# Patient Record
Sex: Female | Born: 1950 | Race: Black or African American | Hispanic: No | Marital: Single | Smoking: Former smoker
Health system: Southern US, Community
[De-identification: ages and names within clinical notes are randomized; demographics above are authoritative.]

## PROBLEM LIST (undated history)

## (undated) DIAGNOSIS — I639 Cerebral infarction, unspecified: Secondary | ICD-10-CM

## (undated) DIAGNOSIS — E7800 Pure hypercholesterolemia, unspecified: Secondary | ICD-10-CM

## (undated) DIAGNOSIS — K5792 Diverticulitis of intestine, part unspecified, without perforation or abscess without bleeding: Secondary | ICD-10-CM

## (undated) HISTORY — DX: Cerebral infarction, unspecified: I63.9

## (undated) HISTORY — PX: ABDOMINAL HYSTERECTOMY: SHX81

---

## 2010-03-22 ENCOUNTER — Ambulatory Visit: Payer: Self-pay | Admitting: Diagnostic Radiology

## 2010-03-22 ENCOUNTER — Emergency Department (HOSPITAL_BASED_OUTPATIENT_CLINIC_OR_DEPARTMENT_OTHER): Admission: EM | Admit: 2010-03-22 | Discharge: 2010-03-22 | Payer: Self-pay | Admitting: Emergency Medicine

## 2010-04-01 ENCOUNTER — Encounter: Admission: RE | Admit: 2010-04-01 | Discharge: 2010-04-17 | Payer: Self-pay | Admitting: Orthopaedic Surgery

## 2014-06-12 ENCOUNTER — Emergency Department (HOSPITAL_BASED_OUTPATIENT_CLINIC_OR_DEPARTMENT_OTHER)
Admission: EM | Admit: 2014-06-12 | Discharge: 2014-06-12 | Disposition: A | Discharge status: Home / Self Care | Payer: Worker's Compensation | Attending: Emergency Medicine | Admitting: Emergency Medicine

## 2014-06-12 ENCOUNTER — Emergency Department (HOSPITAL_BASED_OUTPATIENT_CLINIC_OR_DEPARTMENT_OTHER): Payer: Worker's Compensation

## 2014-06-12 ENCOUNTER — Encounter (HOSPITAL_BASED_OUTPATIENT_CLINIC_OR_DEPARTMENT_OTHER): Payer: Self-pay | Admitting: Emergency Medicine

## 2014-06-12 DIAGNOSIS — Y9289 Other specified places as the place of occurrence of the external cause: Secondary | ICD-10-CM | POA: Diagnosis not present

## 2014-06-12 DIAGNOSIS — S46909A Unspecified injury of unspecified muscle, fascia and tendon at shoulder and upper arm level, unspecified arm, initial encounter: Secondary | ICD-10-CM | POA: Insufficient documentation

## 2014-06-12 DIAGNOSIS — Y9389 Activity, other specified: Secondary | ICD-10-CM | POA: Insufficient documentation

## 2014-06-12 DIAGNOSIS — M25552 Pain in left hip: Secondary | ICD-10-CM

## 2014-06-12 DIAGNOSIS — S79919A Unspecified injury of unspecified hip, initial encounter: Secondary | ICD-10-CM | POA: Diagnosis not present

## 2014-06-12 DIAGNOSIS — Z862 Personal history of diseases of the blood and blood-forming organs and certain disorders involving the immune mechanism: Secondary | ICD-10-CM | POA: Diagnosis not present

## 2014-06-12 DIAGNOSIS — W010XXA Fall on same level from slipping, tripping and stumbling without subsequent striking against object, initial encounter: Secondary | ICD-10-CM | POA: Insufficient documentation

## 2014-06-12 DIAGNOSIS — S4980XA Other specified injuries of shoulder and upper arm, unspecified arm, initial encounter: Secondary | ICD-10-CM | POA: Insufficient documentation

## 2014-06-12 DIAGNOSIS — Z8639 Personal history of other endocrine, nutritional and metabolic disease: Secondary | ICD-10-CM | POA: Insufficient documentation

## 2014-06-12 DIAGNOSIS — Z7982 Long term (current) use of aspirin: Secondary | ICD-10-CM | POA: Insufficient documentation

## 2014-06-12 DIAGNOSIS — S79929A Unspecified injury of unspecified thigh, initial encounter: Secondary | ICD-10-CM

## 2014-06-12 DIAGNOSIS — M25512 Pain in left shoulder: Secondary | ICD-10-CM

## 2014-06-12 DIAGNOSIS — W19XXXA Unspecified fall, initial encounter: Secondary | ICD-10-CM

## 2014-06-12 HISTORY — DX: Pure hypercholesterolemia, unspecified: E78.00

## 2014-06-12 MED ORDER — KETOROLAC TROMETHAMINE 60 MG/2ML IM SOLN
60.0000 mg | Freq: Once | INTRAMUSCULAR | Status: AC
  Administered 2014-06-12: 60 mg via INTRAMUSCULAR
  Filled 2014-06-12: qty 2

## 2014-06-12 NOTE — Discharge Instructions (Signed)
Fall Prevention and Home Safety °Falls cause injuries and can affect all age groups. It is possible to use preventive measures to significantly decrease the likelihood of falls. There are many simple measures which can make your home safer and prevent falls. °OUTDOORS °· Repair cracks and edges of walkways and driveways. °· Remove high doorway thresholds. °· Trim shrubbery on the main path into your home. °· Have good outside lighting. °· Clear walkways of tools, rocks, debris, and clutter. °· Check that handrails are not broken and are securely fastened. Both sides of steps should have handrails. °· Have leaves, snow, and ice cleared regularly. °· Use sand or salt on walkways during winter months. °· In the garage, clean up grease or oil spills. °BATHROOM °· Install night lights. °· Install grab bars by the toilet and in the tub and shower. °· Use non-skid mats or decals in the tub or shower. °· Place a plastic non-slip stool in the shower to sit on, if needed. °· Keep floors dry and clean up all water on the floor immediately. °· Remove soap buildup in the tub or shower on a regular basis. °· Secure bath mats with non-slip, double-sided rug tape. °· Remove throw rugs and tripping hazards from the floors. °BEDROOMS °· Install night lights. °· Make sure a bedside light is easy to reach. °· Do not use oversized bedding. °· Keep a telephone by your bedside. °· Have a firm chair with side arms to use for getting dressed. °· Remove throw rugs and tripping hazards from the floor. °KITCHEN °· Keep handles on pots and pans turned toward the center of the stove. Use back burners when possible. °· Clean up spills quickly and allow time for drying. °· Avoid walking on wet floors. °· Avoid hot utensils and knives. °· Position shelves so they are not too high or low. °· Place commonly used objects within easy reach. °· If necessary, use a sturdy step stool with a grab bar when reaching. °· Keep electrical cables out of the  way. °· Do not use floor polish or wax that makes floors slippery. If you must use wax, use non-skid floor wax. °· Remove throw rugs and tripping hazards from the floor. °STAIRWAYS °· Never leave objects on stairs. °· Place handrails on both sides of stairways and use them. Fix any loose handrails. Make sure handrails on both sides of the stairways are as long as the stairs. °· Check carpeting to make sure it is firmly attached along stairs. Make repairs to worn or loose carpet promptly. °· Avoid placing throw rugs at the top or bottom of stairways, or properly secure the rug with carpet tape to prevent slippage. Get rid of throw rugs, if possible. °· Have an electrician put in a light switch at the top and bottom of the stairs. °OTHER FALL PREVENTION TIPS °· Wear low-heel or rubber-soled shoes that are supportive and fit well. Wear closed toe shoes. °· When using a stepladder, make sure it is fully opened and both spreaders are firmly locked. Do not climb a closed stepladder. °· Add color or contrast paint or tape to grab bars and handrails in your home. Place contrasting color strips on first and last steps. °· Learn and use mobility aids as needed. Install an electrical emergency response system. °· Turn on lights to avoid dark areas. Replace light bulbs that burn out immediately. Get light switches that glow. °· Arrange furniture to create clear pathways. Keep furniture in the same place. °·   Firmly attach carpet with non-skid or double-sided tape. °· Eliminate uneven floor surfaces. °· Select a carpet pattern that does not visually hide the edge of steps. °· Be aware of all pets. °OTHER HOME SAFETY TIPS °· Set the water temperature for 120° F (48.8° C). °· Keep emergency numbers on or near the telephone. °· Keep smoke detectors on every level of the home and near sleeping areas. °Document Released: 08/27/2002 Document Revised: 03/07/2012 Document Reviewed: 11/26/2011 °ExitCare® Patient Information ©2015  [employer]. This information is not intended to replace advice given to you by your health care provider. Make sure you discuss any questions you have with your health care provider. °Musculoskeletal Pain °Musculoskeletal pain is muscle and boney aches and pains. These pains can occur in any part of the body. Your caregiver may treat you without knowing the cause of the pain. They may treat you if blood or urine tests, X-rays, and other tests were normal.  °CAUSES °There is often not a definite cause or reason for these pains. These pains may be caused by a type of germ (virus). The discomfort may also come from overuse. Overuse includes working out too hard when your body is not fit. Boney aches also come from weather changes. Bone is sensitive to atmospheric pressure changes. °HOME CARE INSTRUCTIONS  °· Ask when your test results will be ready. Make sure you get your test results. °· Only take over-the-counter or prescription medicines for pain, discomfort, or fever as directed by your caregiver. If you were given medications for your condition, do not drive, operate machinery or power tools, or sign legal documents for 24 hours. Do not drink alcohol. Do not take sleeping pills or other medications that may interfere with treatment. °· Continue all activities unless the activities cause more pain. When the pain lessens, slowly resume normal activities. Gradually increase the intensity and duration of the activities or exercise. °· During periods of severe pain, bed rest may be helpful. Lay or sit in any position that is comfortable. °· Putting ice on the injured area. °¨ Put ice in a bag. °¨ Place a towel between your skin and the bag. °¨ Leave the ice on for 15 to 20 minutes, 3 to 4 times a day. °· Follow up with your caregiver for continued problems and no reason can be found for the pain. If the pain becomes worse or does not go away, it may be necessary to repeat tests or do additional testing. Your  caregiver may need to look further for a possible cause. °SEEK IMMEDIATE MEDICAL CARE IF: °· You have pain that is getting worse and is not relieved by medications. °· You develop chest pain that is associated with shortness or breath, sweating, feeling sick to your stomach (nauseous), or throw up (vomit). °· Your pain becomes localized to the abdomen. °· You develop any new symptoms that seem different or that concern you. °MAKE SURE YOU:  °· Understand these instructions. °· Will watch your condition. °· Will get help right away if you are not doing well or get worse. °Document Released: 09/06/2005 Document Revised: 11/29/2011 Document Reviewed: 05/11/2013 °ExitCare® Patient Information ©2015 [employer]. This information is not intended to replace advice given to you by your health care provider. Make sure you discuss any questions you have with your health care provider. ° °

## 2014-06-12 NOTE — ED Provider Notes (Signed)
CSN: [id number]     Arrival date & time 06/12/14  0940 History   First MD Initiated Contact with Patient 06/12/14 1148     Chief Complaint  Patient presents with  . Fall     (Consider location/radiation/quality/duration/timing/severity/associated sxs/prior Treatment) Patient is a 63 y.o. female presenting with fall.  Fall This is a new problem. The current episode started yesterday. Episode frequency: once. Associated symptoms comments: Left shoulder pain, left hip pain . The symptoms are aggravated by walking (movement). Nothing relieves the symptoms.    Past Medical History  Diagnosis Date  . High cholesterol    History reviewed. No pertinent past surgical history. No family history on file. History  Substance Use Topics  . Smoking status: Never Smoker   . Smokeless tobacco: Not on file  . Alcohol Use: Not on file   OB History   Grav Para Term Preterm Abortions TAB SAB Ect Mult Living                 Review of Systems  All other systems reviewed and are negative.     Allergies  Review of patient's allergies indicates no known allergies.  Home Medications   Prior to Admission medications   Medication Sig Start Date End Date Taking? Authorizing Provider  aspirin 81 MG tablet Take 81 mg by mouth daily.   Yes Historical Provider, MD   BP 143/80  Pulse 70  Temp(Src) 98.1 F (36.7 C) (Oral)  Resp 18  Ht 5' 5.5" (1.664 m)  Wt 154 lb (69.854 kg)  BMI 25.23 kg/m2  SpO2 99% Physical Exam  Nursing note and vitals reviewed. Constitutional: She is oriented to person, place, and time. She appears well-developed and well-nourished. No distress.  HENT:  Head: Normocephalic and atraumatic. Head is without raccoon's eyes and without Battle's sign.  Nose: Nose normal.  Eyes: Conjunctivae and EOM are normal. Pupils are equal, round, and reactive to light. No scleral icterus.  Neck: No spinous process tenderness and no muscular tenderness present.  Cardiovascular: Normal  rate, regular rhythm, normal heart sounds and intact distal pulses.   No murmur heard. Pulmonary/Chest: Effort normal and breath sounds normal. She has no rales. She exhibits no tenderness.  Abdominal: Soft. There is no tenderness. There is no rebound and no guarding.  Musculoskeletal: Normal range of motion. She exhibits no edema.       Left shoulder: She exhibits tenderness. She exhibits normal range of motion, no deformity, normal pulse and normal strength.       Left hip: She exhibits tenderness. She exhibits normal range of motion, normal strength and no deformity.       Thoracic back: She exhibits no tenderness and no bony tenderness.       Lumbar back: She exhibits no tenderness and no bony tenderness.  No evidence of trauma to extremities, except as noted.  2+ distal pulses.    Neurological: She is alert and oriented to person, place, and time.  Skin: Skin is warm and dry. No rash noted.  Psychiatric: She has a normal mood and affect.    ED Course  Procedures (including critical care time) Labs Review Labs Reviewed - No data to display  Imaging Review Dg Hip Complete Left  06/12/2014   CLINICAL DATA:  Pain post fall yesterday, left hip pain  EXAM: LEFT HIP - COMPLETE 2+ VIEW  COMPARISON:  None.  FINDINGS: Three views of the left hip submitted. No acute fracture or subluxation. Bilateral hip joints  are symmetrical in appearance. Mild bilateral spurring of greater femoral trochanter. Mild degenerative changes pubic symphysis.  IMPRESSION: No acute fracture or subluxation. Mild degenerative changes as described above.   Electronically Signed   By: Natasha Mead M.D.   On: 06/12/2014 10:23   Dg Shoulder Left  06/12/2014   CLINICAL DATA:  Fall yesterday, left shoulder pain, left hip pain  EXAM: LEFT SHOULDER - 2+ VIEW  COMPARISON:  None.  FINDINGS: There is no evidence of fracture or dislocation. Glenohumeral joint is preserved. Mild degenerative changes AC joint. There is mild spurring of  humeral head.  IMPRESSION: No acute fracture or subluxation. Mild degenerative changes as described above.   Electronically Signed   By: Natasha Mead M.D.   On: 06/12/2014 10:22  All radiology studies independently viewed by me.      EKG Interpretation None      MDM   Final diagnoses:  Fall, initial encounter  Left shoulder pain  Left hip pain    63 yo female presenting with soreness in left shoulder and left hip after a fall yesterday.  She did not strike her head, lose consciousness, or hurt her neck during the fall.  Soreness is severe and was worse when she woke up this morning.  Plain films negative.  Exam unremarkable.  Ambulates without limp.  Don't suspect occult hip fracture.  Plan dc, NSAIDs.    Candyce Churn III, MD 06/12/14 [id number]

## 2014-06-12 NOTE — ED Notes (Signed)
Patient reports that she slipped at work yesterday falling onto left side landing on concrete floor. No loc, complains of left shoulder and left hip pain. Ambulatory on arrival

## 2014-06-12 NOTE — ED Notes (Signed)
Patient preparing for discharge. 

## 2019-11-02 ENCOUNTER — Ambulatory Visit: Payer: Medicare Other | Attending: Internal Medicine

## 2019-11-02 DIAGNOSIS — Z23 Encounter for immunization: Secondary | ICD-10-CM

## 2019-11-02 NOTE — Progress Notes (Signed)
   Covid-19 Vaccination Clinic  Name:  Aviva Wolfer    MRN: [id number] DOB: 06-04-1951  11/02/2019  Ms. Corso was observed post Covid-19 immunization for 15 minutes without incidence. She was provided with Vaccine Information Sheet and instruction to access the V-Safe system.   Ms. Andis was instructed to call 911 with any severe reactions post vaccine: Marland Kitchen Difficulty breathing  . Swelling of your face and throat  . A fast heartbeat  . A bad rash all over your body  . Dizziness and weakness    Immunizations Administered    Name Date Dose VIS Date Route   Pfizer COVID-19 Vaccine 11/02/2019  2:52 PM 0.3 mL 08/31/2019 Intramuscular   Manufacturer: [employer], [employer]   Lot: [id number]   NDC: [address]-1

## 2019-11-25 ENCOUNTER — Ambulatory Visit: Payer: Medicare Other | Attending: Internal Medicine

## 2019-11-25 DIAGNOSIS — Z23 Encounter for immunization: Secondary | ICD-10-CM

## 2019-11-25 NOTE — Progress Notes (Signed)
   Covid-19 Vaccination Clinic  Name:  Anhelica Fowers    MRN: [id number] DOB: [birth year]  11/25/2019  Ms. Ow was observed post Covid-19 immunization for 15 minutes without incident. She was provided with Vaccine Information Sheet and instruction to access the V-Safe system.   Ms. Eilers was instructed to call 911 with any severe reactions post vaccine: Marland Kitchen Difficulty breathing  . Swelling of face and throat  . A fast heartbeat  . A bad rash all over body  . Dizziness and weakness   Immunizations Administered    Name Date Dose VIS Date Route   Pfizer COVID-19 Vaccine 11/25/2019  9:21 AM 0.3 mL 08/31/2019 Intramuscular   Manufacturer: [employer], [employer]   Lot: [id number]   NDC: [address]-9

## 2020-03-25 ENCOUNTER — Encounter (HOSPITAL_BASED_OUTPATIENT_CLINIC_OR_DEPARTMENT_OTHER): Payer: Self-pay

## 2020-03-25 ENCOUNTER — Inpatient Hospital Stay (HOSPITAL_BASED_OUTPATIENT_CLINIC_OR_DEPARTMENT_OTHER)
Admission: EM | Admit: 2020-03-25 | Discharge: 2020-03-28 | DRG: 392 | Disposition: A | Discharge status: Home / Self Care | Payer: Medicare Other | Attending: Internal Medicine | Admitting: Internal Medicine

## 2020-03-25 ENCOUNTER — Other Ambulatory Visit: Payer: Self-pay

## 2020-03-25 ENCOUNTER — Emergency Department (HOSPITAL_BASED_OUTPATIENT_CLINIC_OR_DEPARTMENT_OTHER): Payer: Medicare Other

## 2020-03-25 DIAGNOSIS — Z91018 Allergy to other foods: Secondary | ICD-10-CM | POA: Diagnosis not present

## 2020-03-25 DIAGNOSIS — Z20822 Contact with and (suspected) exposure to covid-19: Secondary | ICD-10-CM | POA: Diagnosis present

## 2020-03-25 DIAGNOSIS — I10 Essential (primary) hypertension: Secondary | ICD-10-CM | POA: Diagnosis present

## 2020-03-25 DIAGNOSIS — Z79899 Other long term (current) drug therapy: Secondary | ICD-10-CM

## 2020-03-25 DIAGNOSIS — Z90710 Acquired absence of both cervix and uterus: Secondary | ICD-10-CM

## 2020-03-25 DIAGNOSIS — R030 Elevated blood-pressure reading, without diagnosis of hypertension: Secondary | ICD-10-CM | POA: Diagnosis not present

## 2020-03-25 DIAGNOSIS — Z7982 Long term (current) use of aspirin: Secondary | ICD-10-CM

## 2020-03-25 DIAGNOSIS — Z87891 Personal history of nicotine dependence: Secondary | ICD-10-CM | POA: Diagnosis not present

## 2020-03-25 DIAGNOSIS — E785 Hyperlipidemia, unspecified: Secondary | ICD-10-CM

## 2020-03-25 DIAGNOSIS — R1032 Left lower quadrant pain: Secondary | ICD-10-CM | POA: Diagnosis not present

## 2020-03-25 DIAGNOSIS — Z881 Allergy status to other antibiotic agents status: Secondary | ICD-10-CM | POA: Diagnosis not present

## 2020-03-25 DIAGNOSIS — K5720 Diverticulitis of large intestine with perforation and abscess without bleeding: Principal | ICD-10-CM | POA: Diagnosis present

## 2020-03-25 DIAGNOSIS — R451 Restlessness and agitation: Secondary | ICD-10-CM | POA: Diagnosis not present

## 2020-03-25 DIAGNOSIS — E7800 Pure hypercholesterolemia, unspecified: Secondary | ICD-10-CM

## 2020-03-25 DIAGNOSIS — K5792 Diverticulitis of intestine, part unspecified, without perforation or abscess without bleeding: Secondary | ICD-10-CM

## 2020-03-25 LAB — URINALYSIS, MICROSCOPIC (REFLEX)

## 2020-03-25 LAB — CBC
HCT: 46.4 % — ABNORMAL HIGH (ref 36.0–46.0)
Hemoglobin: 15.2 g/dL — ABNORMAL HIGH (ref 12.0–15.0)
MCH: 33.3 pg (ref 26.0–34.0)
MCHC: 32.8 g/dL (ref 30.0–36.0)
MCV: 101.8 fL — ABNORMAL HIGH (ref 80.0–100.0)
Platelets: 212 10*3/uL (ref 150–400)
RBC: 4.56 MIL/uL (ref 3.87–5.11)
RDW: 13.3 % (ref 11.5–15.5)
WBC: 8.4 10*3/uL (ref 4.0–10.5)
nRBC: 0 % (ref 0.0–0.2)

## 2020-03-25 LAB — COMPREHENSIVE METABOLIC PANEL
ALT: 13 U/L (ref 0–44)
AST: 17 U/L (ref 15–41)
Albumin: 4.2 g/dL (ref 3.5–5.0)
Alkaline Phosphatase: 80 U/L (ref 38–126)
Anion gap: 12 (ref 5–15)
BUN: 10 mg/dL (ref 8–23)
CO2: 19 mmol/L — ABNORMAL LOW (ref 22–32)
Calcium: 9.4 mg/dL (ref 8.9–10.3)
Chloride: 103 mmol/L (ref 98–111)
Creatinine, Ser: 0.86 mg/dL (ref 0.44–1.00)
GFR calc Af Amer: >60 mL/min (ref >60)
GFR calc non Af Amer: >60 mL/min (ref >60)
Glucose, Bld: 91 mg/dL (ref 70–99)
Potassium: 4.1 mmol/L (ref 3.5–5.1)
Sodium: 134 mmol/L — ABNORMAL LOW (ref 135–145)
Total Bilirubin: 0.8 mg/dL (ref 0.3–1.2)
Total Protein: 7.1 g/dL (ref 6.5–8.1)

## 2020-03-25 LAB — LIPASE, BLOOD: Lipase: 18 U/L (ref 11–51)

## 2020-03-25 LAB — URINALYSIS, ROUTINE W REFLEX MICROSCOPIC
Glucose, UA: 250 mg/dL — AB
Hgb urine dipstick: NEGATIVE
Ketones, ur: NEGATIVE mg/dL
Leukocytes,Ua: NEGATIVE
Nitrite: POSITIVE — AB
Protein, ur: 30 mg/dL — AB
Specific Gravity, Urine: <1.005 — ABNORMAL LOW (ref 1.005–1.030)
pH: 5 (ref 5.0–8.0)

## 2020-03-25 LAB — SARS CORONAVIRUS 2 BY RT PCR (HOSPITAL ORDER, PERFORMED IN ~~LOC~~ HOSPITAL LAB): SARS Coronavirus 2: NEGATIVE

## 2020-03-25 MED ORDER — SODIUM CHLORIDE 0.9 % IV SOLN
2.0000 g | Freq: Once | INTRAVENOUS | Status: AC
  Administered 2020-03-25: 2 g via INTRAVENOUS
  Filled 2020-03-25: qty 20

## 2020-03-25 MED ORDER — ONDANSETRON HCL 4 MG/2ML IJ SOLN
4.0000 mg | Freq: Once | INTRAMUSCULAR | Status: AC
  Administered 2020-03-25: 4 mg via INTRAVENOUS
  Filled 2020-03-25: qty 2

## 2020-03-25 MED ORDER — KETOROLAC TROMETHAMINE 30 MG/ML IJ SOLN
15.0000 mg | Freq: Once | INTRAMUSCULAR | Status: AC
  Administered 2020-03-25: 15 mg via INTRAVENOUS
  Filled 2020-03-25: qty 1

## 2020-03-25 MED ORDER — LACTATED RINGERS IV SOLN: INTRAVENOUS | Status: DC

## 2020-03-25 MED ORDER — ONDANSETRON HCL 4 MG PO TABS: 4.0000 mg | ORAL_TABLET | Freq: Four times a day (QID) | ORAL | Status: DC | PRN

## 2020-03-25 MED ORDER — ENOXAPARIN SODIUM 40 MG/0.4ML ~~LOC~~ SOLN
40.0000 mg | SUBCUTANEOUS | Status: DC
  Administered 2020-03-25 – 2020-03-27 (×3): 40 mg via SUBCUTANEOUS
  Filled 2020-03-25 (×3): qty 0.4

## 2020-03-25 MED ORDER — ONDANSETRON HCL 4 MG/2ML IJ SOLN: 4.0000 mg | Freq: Four times a day (QID) | INTRAMUSCULAR | Status: DC | PRN

## 2020-03-25 MED ORDER — METRONIDAZOLE IN NACL 5-0.79 MG/ML-% IV SOLN
500.0000 mg | Freq: Three times a day (TID) | INTRAVENOUS | Status: DC
  Administered 2020-03-25 – 2020-03-28 (×8): 500 mg via INTRAVENOUS
  Filled 2020-03-25 (×8): qty 100

## 2020-03-25 MED ORDER — SODIUM CHLORIDE 0.9 % IV SOLN
2.0000 g | INTRAVENOUS | Status: DC
  Administered 2020-03-26 – 2020-03-27 (×2): 2 g via INTRAVENOUS
  Filled 2020-03-25: qty 2
  Filled 2020-03-25: qty 20
  Filled 2020-03-25: qty 2

## 2020-03-25 MED ORDER — IOHEXOL 300 MG/ML  SOLN
100.0000 mL | Freq: Once | INTRAMUSCULAR | Status: AC | PRN
  Administered 2020-03-25: 100 mL via INTRAVENOUS

## 2020-03-25 MED ORDER — DIPHENHYDRAMINE HCL 50 MG/ML IJ SOLN
25.0000 mg | Freq: Three times a day (TID) | INTRAMUSCULAR | Status: AC | PRN
  Administered 2020-03-26 (×2): 25 mg via INTRAVENOUS
  Filled 2020-03-25 (×2): qty 1

## 2020-03-25 MED ORDER — METRONIDAZOLE IN NACL 5-0.79 MG/ML-% IV SOLN
500.0000 mg | Freq: Once | INTRAVENOUS | Status: AC
  Administered 2020-03-25: 500 mg via INTRAVENOUS
  Filled 2020-03-25: qty 100

## 2020-03-25 MED ORDER — METOPROLOL TARTRATE 5 MG/5ML IV SOLN: 5.0000 mg | INTRAVENOUS | Status: DC | PRN

## 2020-03-25 MED ORDER — SODIUM CHLORIDE 0.9% FLUSH
3.0000 mL | Freq: Once | INTRAVENOUS | Status: DC
  Filled 2020-03-25: qty 3

## 2020-03-25 MED ORDER — METOPROLOL TARTRATE 5 MG/5ML IV SOLN
5.0000 mg | INTRAVENOUS | Status: DC | PRN
  Administered 2020-03-25: 5 mg via INTRAVENOUS
  Filled 2020-03-25: qty 5

## 2020-03-25 MED ORDER — FENTANYL CITRATE (PF) 100 MCG/2ML IJ SOLN
50.0000 ug | Freq: Once | INTRAMUSCULAR | Status: AC
  Administered 2020-03-25: 50 ug via INTRAVENOUS
  Filled 2020-03-25: qty 2

## 2020-03-25 MED ORDER — SODIUM CHLORIDE 0.9 % IV BOLUS
1000.0000 mL | Freq: Once | INTRAVENOUS | Status: AC
  Administered 2020-03-25: 1000 mL via INTRAVENOUS

## 2020-03-25 MED ORDER — LISINOPRIL 10 MG PO TABS
10.0000 mg | ORAL_TABLET | Freq: Every day | ORAL | Status: DC
  Administered 2020-03-26 – 2020-03-27 (×2): 10 mg via ORAL
  Filled 2020-03-25 (×3): qty 1

## 2020-03-25 MED ORDER — SODIUM CHLORIDE 0.9 % IV SOLN: INTRAVENOUS | Status: DC | PRN

## 2020-03-25 MED ORDER — HYDROMORPHONE HCL 1 MG/ML IJ SOLN
0.5000 mg | INTRAMUSCULAR | Status: DC | PRN
  Administered 2020-03-25: 0.5 mg via INTRAVENOUS
  Administered 2020-03-25 – 2020-03-26 (×4): 1 mg via INTRAVENOUS
  Filled 2020-03-25 (×5): qty 1

## 2020-03-25 MED ORDER — HYDRALAZINE HCL 25 MG PO TABS
25.0000 mg | ORAL_TABLET | Freq: Three times a day (TID) | ORAL | Status: DC
  Administered 2020-03-25 – 2020-03-28 (×10): 25 mg via ORAL
  Filled 2020-03-25 (×10): qty 1

## 2020-03-25 NOTE — ED Notes (Signed)
Nurse informed of recollect

## 2020-03-25 NOTE — ED Provider Notes (Signed)
Medical screening examination/treatment/procedure(s) were conducted as a shared visit with non-physician practitioner(s) and myself.  I personally evaluated the patient during the encounter.     Patient seen by me along with physician assistant.  Patient with complaint of left-sided abdominal pain for 3 days.  No nausea vomiting or diarrhea.  Is worse after voiding.  Some tenderness left lower quadrant.  CT scan shows diverticulitis with a intraluminal abscess measuring 1.7 cm.  Discussed with on-call general surgery.  The recommending inpatient admission for IV antibiotics.  This will be is with the hospitalist service and general surgery can consult.  Patient otherwise nontoxic no acute distress no leukocytosis.  Not tachycardic.  Not febrile.   Vanetta Mulders, MD 03/25/20 1622

## 2020-03-25 NOTE — Consult Note (Signed)
Reason for Consult:sigmoid diverticulitis with abscess Referring Physician: Dr. [name] is an 69 y.o. female.  HPI: This is a 69 year old female transferred from med [employer].  She presented with a 3 to 4-day history of left lower quadrant abdominal pain.  It is been intermittent and occasionally sharp and as severe as 9 out of 10.  Currently she is feeling better.  Her bowel movements have been normal.  She denies fevers or chills.  She has no prior history of diverticulitis.  She reports her last colonoscopy was at least 7 years ago and she has had benign polyps removed in the past.  There is no family history of colon cancer.  She underwent a CAT scan of the abdomen and pelvis showing diverticulitis in the sigmoid colon with an intramural abscess measuring about 1.7 cm.  Past Medical History:  Diagnosis Date  . High cholesterol     Past Surgical History:  Procedure Laterality Date  . ABDOMINAL HYSTERECTOMY      History reviewed. No pertinent family history.  Social History:  reports that she has quit smoking. She has never used smokeless tobacco. She reports current alcohol use. No history on file for drug use.  Allergies:  Allergies  Allergen Reactions  . Doxycycline Hives  . Strawberry (Diagnostic) Hives    Medications: I have reviewed the patient's current medications.  Results for orders placed or performed during the hospital encounter of 03/25/20 (from the past 48 hour(s))  Urinalysis, Routine w reflex microscopic     Status: Abnormal   Collection Time: 03/25/20  1:32 PM  Result Value Ref Range   Color, Urine ORANGE (A) YELLOW    Comment: BIOCHEMICALS MAY BE AFFECTED BY COLOR   APPearance CLEAR CLEAR   Specific Gravity, Urine <1.005 (L) 1.005 - 1.030   pH 5.0 5.0 - 8.0   Glucose, UA 250 (A) NEGATIVE mg/dL   Hgb urine dipstick NEGATIVE NEGATIVE   Bilirubin Urine SMALL (A) NEGATIVE   Ketones, ur NEGATIVE NEGATIVE mg/dL   Protein, ur 30 (A)  NEGATIVE mg/dL   Nitrite POSITIVE (A) NEGATIVE   Leukocytes,Ua NEGATIVE NEGATIVE    Comment: Performed at [facility], [address] [place], [place] [address]  Urinalysis, Microscopic (reflex)     Status: Abnormal   Collection Time: 03/25/20  1:32 PM  Result Value Ref Range   RBC / HPF 0-5 0 - 5 RBC/hpf   WBC, UA 0-5 0 - 5 WBC/hpf   Bacteria, UA RARE (A) NONE SEEN   Squamous Epithelial / LPF 0-5 0 - 5    Comment: Performed at [facility], [address] [place], [place] [address]  CBC     Status: Abnormal   Collection Time: 03/25/20  1:49 PM  Result Value Ref Range   WBC 8.4 4.0 - 10.5 K/uL   RBC 4.56 3.87 - 5.11 MIL/uL   Hemoglobin 15.2 (H) 12.0 - 15.0 g/dL   HCT 93.2 (H) 36 - 46 %   MCV 101.8 (H) 80.0 - 100.0 fL   MCH 33.3 26.0 - 34.0 pg   MCHC 32.8 30.0 - 36.0 g/dL   RDW 35.5 73.2 - 20.2 %   Platelets 212 150 - 400 K/uL   nRBC 0.0 0.0 - 0.2 %    Comment: Performed at [facility], [address] [place], [place] [address]  Comprehensive metabolic panel     Status: Abnormal   Collection Time:  03/25/20  2:20 PM  Result Value Ref Range   Sodium 134 (L) 135 - 145 mmol/L   Potassium 4.1 3.5 - 5.1 mmol/L    Comment: SLIGHT HEMOLYSIS   Chloride 103 98 - 111 mmol/L   CO2 19 (L) 22 - 32 mmol/L   Glucose, Bld 91 70 - 99 mg/dL    Comment: Glucose reference range applies only to samples taken after fasting for at least 8 hours.   BUN 10 8 - 23 mg/dL   Creatinine, Ser 6.60 0.44 - 1.00 mg/dL   Calcium 9.4 8.9 - 63.0 mg/dL   Total Protein 7.1 6.5 - 8.1 g/dL   Albumin 4.2 3.5 - 5.0 g/dL   AST 17 15 - 41 U/L   ALT 13 0 - 44 U/L   Alkaline Phosphatase 80 38 - 126 U/L   Total Bilirubin 0.8 0.3 - 1.2 mg/dL   GFR calc non Af Amer >60 >60 mL/min   GFR calc Af Amer >60 >60 mL/min   Anion gap 12 5 - 15    Comment: Performed at [facility], [address] [place], [place] [address]  Lipase, blood     Status: None   Collection Time:  03/25/20  2:20 PM  Result Value Ref Range   Lipase 18 11 - 51 U/L    Comment: Performed at [facility], [address] [place], [place] [address]  SARS Coronavirus 2 by RT PCR (hospital order, performed in [facility] hospital lab) Nasopharyngeal Nasopharyngeal Swab     Status: None   Collection Time: 03/25/20  4:42 PM   Specimen: Nasopharyngeal Swab  Result Value Ref Range   SARS Coronavirus 2 NEGATIVE NEGATIVE    Comment: (NOTE) SARS-CoV-2 target nucleic acids are NOT DETECTED.  The SARS-CoV-2 RNA is generally detectable in upper and lower respiratory specimens during the acute phase of infection. The lowest concentration of SARS-CoV-2 viral copies this assay can detect is 250 copies / mL. A negative result does not preclude SARS-CoV-2 infection and should not be used as the sole basis for treatment or other patient management decisions.  A negative result may occur with improper specimen collection / handling, submission of specimen other than nasopharyngeal swab, presence of viral mutation(s) within the areas targeted by this assay, and inadequate number of viral copies (<250 copies / mL). A negative result must be combined with clinical observations, patient history, and epidemiological information.  Fact Sheet for Patients:   BoilerBrush.com.cy  Fact Sheet for Healthcare Providers: https://pope.com/  This test is not yet approved or  cleared by the [place] FDA and has been authorized for detection and/or diagnosis of SARS-CoV-2 by FDA under an Emergency Use Authorization (EUA).  This EUA will remain in effect (meaning this test can be used) for the duration of the COVID-19 declaration under Section 564(b)(1) of the Act, 21 U.S.C. section 360bbb-3(b)(1), unless the authorization is terminated or revoked sooner.  Performed at [facility], [address] [place], [place] [address]     CT  ABDOMEN PELVIS W CONTRAST  Result Date: 03/25/2020 CLINICAL DATA:  Abdominal pain.  LEFT-sided abdominal pain EXAM: CT ABDOMEN AND PELVIS WITH CONTRAST TECHNIQUE: Multidetector CT imaging of the abdomen and pelvis was performed using the standard protocol following bolus administration of intravenous contrast. CONTRAST:  OMNIPAQUE IOHEXOL 300 MG/ML  SOLN COMPARISON:  None. FINDINGS: Lower chest: Lung bases are clear. Hepatobiliary: Multiple small hypodense lesions in liver likely  represent small cysts. Gallbladder normal. Pancreas: Pancreas is normal. No ductal dilatation. No pancreatic inflammation. Spleen: Normal spleen Adrenals/urinary tract: Adrenal glands and kidneys are normal. The ureters and bladder normal. Stomach/Bowel: Stomach, duodenum small-bowel. Cecum and appendix are normal. Ascending transverse colon normal. Descending colon normal. There is inflammatory process along the anti mesenteric wall of the proximal sigmoid colon. This inflammation occurs over approximately 5 cm segment (image 65/2). There appears to be small abscess within the wall of the sigmoid colon with a rim enhancing low-density collection measuring 1.7 cm image 65/2. This is through a region of extensive diverticulosis. NO frank perforation or extraluminal abscess. Vascular/Lymphatic: Abdominal aorta is normal caliber with atherosclerotic calcification. There is no retroperitoneal or periportal lymphadenopathy. No pelvic lymphadenopathy. Reproductive: Post hysterectomy no adnexal abnormality Other: No free fluid. Musculoskeletal: No aggressive osseous lesion. IMPRESSION: 1. Acute diverticulitis of the proximal sigmoid with intramural abscess. NO frank perforation or extraluminal abscess identified. 2. As there is asymmetric thickening through the sigmoid colon recommend follow-up imaging to exclude an unlikely underlying neoplasm. Electronically Signed   By: Morgan Sparks M.D.   On: 03/25/2020 15:46    Review of Systems   Constitutional: Negative for chills and fever.  Respiratory: Negative for shortness of breath.   Cardiovascular: Negative for chest pain.  Gastrointestinal: Positive for abdominal pain. Negative for blood in stool, constipation, diarrhea and nausea.  Genitourinary: Negative for difficulty urinating.  All other systems reviewed and are negative.  Blood pressure (!) 175/90, pulse 70, temperature 98.6 F (37 C), temperature source Oral, resp. rate 20, height 5\' 5"  (1.651 m), weight 66.9 kg, SpO2 100 %. Physical Exam Constitutional:      General: She is not in acute distress.    Appearance: She is well-developed. She is not diaphoretic.  HENT:     Head: Normocephalic and atraumatic.  Cardiovascular:     Rate and Rhythm: Normal rate and regular rhythm.     Heart sounds: Normal heart sounds.  Pulmonary:     Effort: Pulmonary effort is normal.     Breath sounds: Normal breath sounds.  Chest:     Chest wall: No tenderness.  Abdominal:     General: There is no distension.     Palpations: Abdomen is soft.     Tenderness: There is abdominal tenderness in the left lower quadrant.     Hernia: No hernia is present.  Neurological:     General: No focal deficit present.     Mental Status: She is alert.  Psychiatric:        Mood and Affect: Mood normal.        Behavior: Behavior normal.     Assessment/Plan: Sigmoid diverticulitis with intramural abscess  Patient is being admitted for IV antibiotics and bowel rest.  I explained the diagnosis to her and a family member by phone.  Hopefully she will improve quickly without the need for any surgical intervention which would necessitate a possible resection and colostomy.  Currently, the abscess is not amenable to IR drainage.  Hopefully she will improve and then it is recommended that she have a colonoscopy in approximately 6 to 8 weeks to rule out a malignancy.  Again, bowel rest is currently recommended especially given the abscess and the  amount of discomfort she has been having.  Surgery will follow with you.  03/25/2020, 7:45 PM

## 2020-03-25 NOTE — Progress Notes (Signed)
Pt arrived to floor via [facility] from [facility]. Admitter and On Call with General Surgery made aware of her arrival. Pt A&O x4, BP elevated, all other VSS. Minimal complaints of pain in LLQ at this time. Will continue to monitor patient.

## 2020-03-25 NOTE — ED Notes (Signed)
Patient transported to CT 

## 2020-03-25 NOTE — ED Triage Notes (Signed)
Pt c/o left side abd pain x 3 days-denies n/v/d-states pain worse after voiding-denies dysuria-states she did use OTC AZO without relief-NAD-slow gait

## 2020-03-25 NOTE — H&P (Signed)
History and Physical    Morgan Sparks UVO:[id number] DOB: 1950/11/27 DOA: 03/25/2020  PCP: Karle Plumber, MD  Patient coming from: Home  I have personally briefly reviewed patient's old medical records in [facility]  Chief Complaint: Abdominal pain  HPI: Morgan Sparks is a 69 y.o. female with medical history significant of hypercholesterolemia, not on meds, occasional elevated blood pressure though she is on no meds for this either.  She presented to the ER today with a several week history of lower abdominal pain that she initially attributed to a UTI and tried Azo for but did not improve.  Over the last 3 to 4 days she has had increasing pain mostly is in the left lower quadrant.  She denies any change in her bowels, nausea, vomiting,  ED Course: ED found a 1.7 cm intraluminal diverticular abscess.  Surgery was consulted, they advised admission to the hospitalist service for IV antibiotics.  We are asked to admit to manage her diverticular abscess.  Review of Systems: As per HPI otherwise 10 point review of systems negative.   Past Medical History:  Diagnosis Date  . High cholesterol     Past Surgical History:  Procedure Laterality Date  . ABDOMINAL HYSTERECTOMY       reports that she has quit smoking. She has never used smokeless tobacco. She reports current alcohol use. No history on file for drug use.  Allergies  Allergen Reactions  . Doxycycline Hives  . Strawberry (Diagnostic) Hives    History reviewed. No pertinent family history.  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 Authorizing Provider  albuterol (VENTOLIN HFA) 108 (90 Base) MCG/ACT inhaler Inhale 2 puffs into the lungs daily as needed for wheezing or shortness of breath.  12/18/19  Yes [provider]  aspirin 81 MG tablet  Take 81 mg by mouth See admin instructions. Takes twice a week   Yes [provider]  ezetimibe-simvastatin (VYTORIN) 10-20 MG tablet Take 1 tablet by mouth daily.   Yes [provider]  sodium chloride (OCEAN) 0.65 % SOLN nasal spray Place 1 spray into both nostrils as needed for congestion.   Yes [provider]    Physical Exam: Vitals:   03/25/20 1444 03/25/20 1516 03/25/20 1703 03/25/20 1832  BP: (!) 168/91 (!) 189/102 (!) 148/96 (!) 175/90  Pulse: 78 79 70 70  Resp: 18 16 16 20   Temp:    98.6 F (37 C)  TempSrc:    Oral  SpO2: 98% 99% 98% 100%  Weight:    66.9 kg  Height:    5\' 5"  (1.651 m)    Constitutional: NAD, calm, comfortable Eyes: PERRL, lids and conjunctivae normal ENMT: Mucous membranes are moist. Posterior pharynx clear of any exudate or lesions.Normal dentition.  Neck: normal, supple, no masses, no thyromegaly Respiratory: clear to auscultation bilaterally, no wheezing, no crackles. Normal respiratory effort. No accessory muscle use.  Cardiovascular: Regular rate and rhythm, no murmurs / rubs / gallops. No extremity edema. 2+ pedal pulses. No carotid bruits.  Abdomen: Left lower quadrant tenderness, no masses palpated. No hepatosplenomegaly. Bowel sounds sluggish.  Musculoskeletal: no clubbing / cyanosis. No joint deformity upper and lower extremities. Good ROM, no contractures. Normal muscle tone.  Skin: no rashes, lesions, ulcers. No induration Neurologic: CN 2-12 grossly intact. Sensation intact,  DTR normal. Strength 5/5 in all 4.  Psychiatric: Normal judgment and insight. Alert and oriented x 3. Normal mood.   Labs on Admission: I have personally reviewed following labs and imaging studies  CBC: Recent Labs  Lab 03/25/20 1349  WBC 8.4  HGB 15.2*  HCT 46.4*  MCV 101.8*  PLT 212   Basic Metabolic Panel: Recent Labs  Lab 03/25/20 1420  NA 134*  K 4.1  CL 103  CO2 19*  GLUCOSE 91  BUN 10  CREATININE 0.86  CALCIUM 9.4    GFR: Estimated Creatinine Clearance: 57.1 mL/min (by C-G formula based on SCr of 0.86 mg/dL). Liver Function Tests: Recent Labs  Lab 03/25/20 1420  AST 17  ALT 13  ALKPHOS 80  BILITOT 0.8  PROT 7.1  ALBUMIN 4.2   Recent Labs  Lab 03/25/20 1420  LIPASE 18   Urine analysis:    Component Value Date/Time   COLORURINE ORANGE (A) 03/25/2020 1332   APPEARANCEUR CLEAR 03/25/2020 1332   LABSPEC <1.005 (L) 03/25/2020 1332   PHURINE 5.0 03/25/2020 1332   GLUCOSEU 250 (A) 03/25/2020 1332   HGBUR NEGATIVE 03/25/2020 1332   BILIRUBINUR SMALL (A) 03/25/2020 1332   KETONESUR NEGATIVE 03/25/2020 1332   PROTEINUR 30 (A) 03/25/2020 1332   NITRITE POSITIVE (A) 03/25/2020 1332   LEUKOCYTESUR NEGATIVE 03/25/2020 1332    Radiological Exams on Admission: CT ABDOMEN PELVIS W CONTRAST  Result Date: 03/25/2020 CLINICAL DATA:  Abdominal pain.  LEFT-sided abdominal pain EXAM: CT ABDOMEN AND PELVIS WITH CONTRAST TECHNIQUE: Multidetector CT imaging of the abdomen and pelvis was performed using the standard protocol following bolus administration of intravenous contrast. CONTRAST:  OMNIPAQUE IOHEXOL 300 MG/ML  SOLN COMPARISON:  None. FINDINGS: Lower chest: Lung bases are clear. Hepatobiliary: Multiple small hypodense lesions in liver likely represent small cysts. Gallbladder normal. Pancreas: Pancreas is normal. No ductal dilatation. No pancreatic inflammation. Spleen: Normal spleen Adrenals/urinary tract: Adrenal glands and kidneys are normal. The ureters and bladder normal. Stomach/Bowel: Stomach, duodenum small-bowel. Cecum and appendix are normal. Ascending transverse colon normal. Descending colon normal. There is inflammatory process along the anti mesenteric wall of the proximal sigmoid colon. This inflammation occurs over approximately 5 cm segment (image 65/2). There appears to be small abscess within the wall of the sigmoid colon with a rim enhancing low-density collection measuring 1.7 cm  image 65/2. This is through a region of extensive diverticulosis. NO frank perforation or extraluminal abscess. Vascular/Lymphatic: Abdominal aorta is normal caliber with atherosclerotic calcification. There is no retroperitoneal or periportal lymphadenopathy. No pelvic lymphadenopathy. Reproductive: Post hysterectomy no adnexal abnormality Other: No free fluid. Musculoskeletal: No aggressive osseous lesion. IMPRESSION: 1. Acute diverticulitis of the proximal sigmoid with intramural abscess. NO frank perforation or extraluminal abscess identified. 2. As there is asymmetric thickening through the sigmoid colon recommend follow-up imaging to exclude an unlikely underlying neoplasm. Electronically Signed   By: Genevive Bi M.D.   On: 03/25/2020 15:46    Assessment/Plan Principal Problem:   Colonic diverticular abscess Active Problems:   Diverticulitis   Hypertension, essential   Hypercholesteremia  Colonic diverticular abscess On Rocephin plus Flagyl Surgery consult performed Bowel rest with clears Pain control  Hypertension  Lisinopril started Hydralazine Metoprolol as needed  Hypercholesterolemia  Low-cholesterol diet  DVT prophylaxis: Lovenox SQ Code Status: Full code  Family Communication: Patient at bedside Disposition Plan: Home Consults called: Surgery Admission status: Inpatient for IV antibiotics, plus pain control   Reva Bores MD [facility]  If 7PM-7AM,  please contact night-coverage 03/25/2020, 8:13 PM

## 2020-03-25 NOTE — Care Plan (Signed)
[name] (DOB 11/06/1950) by ED PA Harolyn Rutherford  The patient is a overall well-appearing 69 year old came in with abdominal pain on the left side and she has a diverticulitis by CT scan with an inflammatory abscess small 1.7 cm but she is nontoxic-appearing does not have sepsis and no leukocytosis. EDP talked to Dr. Donell Beers with general surgery and she recommended medicine admission and IV antibiotics. Patient Ok'd for clear liquids and ice chips by Surgery. She has a mild Metabolic Acidosis. Please notify Surgery when she gets admitted and transferred over.  Pressure is also elevated and she does not have a history of hypertension.  She has a history of abdominal hysterectomy as well as hyperlipidemia.  Accepted to a telemetry bed Covid testing still pending.

## 2020-03-25 NOTE — ED Provider Notes (Signed)
[facility] EMERGENCY DEPARTMENT Provider Note   CSN: [id number] Arrival date & time: 03/25/20  1225     History Chief Complaint  Patient presents with  . Abdominal Pain    Morgan Sparks is a 69 y.o. female.  HPI     Morgan Sparks is a 69 y.o. female, with a history of hypercholesterolemia, hysterectomy, presenting to the ED with abdominal pain for the last 3 to 4 days.  Pain is in the left lower abdomen, intermittent, currently 9/10, radiating at times toward the groin. She has not had this pain before. Last bowel movement was today and was normal. Denies fever/chills, N/V/C/D, chest pain, shortness of breath, back pain, urinary symptoms, dizziness, syncope, neurologic deficits, falls/trauma, hematochezia/melena, or any other complaints.     Past Medical History:  Diagnosis Date  . High cholesterol     Patient Active Problem List   Diagnosis Date Noted  . Diverticulitis 03/25/2020    Past Surgical History:  Procedure Laterality Date  . ABDOMINAL HYSTERECTOMY       OB History   No obstetric history on file.     History reviewed. No pertinent family history.  Social History   Tobacco Use  . Smoking status: Former Games developer  . Smokeless tobacco: Never Used  Vaping Use  . Vaping Use: Never used  Substance Use Topics  . Alcohol use: Yes    Comment: occ  . Drug use: Not on file    Home Medications Prior to Admission medications   Medication Sig Start Date End Date Taking? Authorizing Provider  aspirin 81 MG tablet Take 81 mg by mouth daily.    [provider]    Allergies    Doxycycline and Strawberry (diagnostic)  Review of Systems   Review of Systems  Constitutional: Negative for chills, diaphoresis and fever.  Respiratory: Negative for cough and shortness of breath.   Cardiovascular: Negative for chest pain.  Gastrointestinal: Positive for abdominal pain. Negative for blood in stool, constipation, diarrhea, nausea and vomiting.   Genitourinary: Negative for difficulty urinating, dysuria, flank pain and hematuria.  Musculoskeletal: Negative for back pain.  Neurological: Negative for dizziness, syncope, weakness and numbness.  All other systems reviewed and are negative.   Physical Exam Updated Vital Signs BP (!) 178/107 (BP Location: Right Arm)   Pulse 81   Temp 98 F (36.7 C) (Oral)   Resp 18   Ht 5\' 5"  (1.651 m)   Wt 66.2 kg   SpO2 100%   BMI 24.30 kg/m   Physical Exam Vitals and nursing note reviewed.  Constitutional:      General: She is not in acute distress.    Appearance: She is well-developed. She is not diaphoretic.  HENT:     Head: Normocephalic and atraumatic.     Mouth/Throat:     Mouth: Mucous membranes are moist.     Pharynx: Oropharynx is clear.  Eyes:     Conjunctiva/sclera: Conjunctivae normal.  Cardiovascular:     Rate and Rhythm: Normal rate and regular rhythm.     Pulses: Normal pulses.          Radial pulses are 2+ on the right side and 2+ on the left side.       Posterior tibial pulses are 2+ on the right side and 2+ on the left side.     Heart sounds: Normal heart sounds.     Comments: Tactile temperature in the extremities appropriate and equal bilaterally. Pulmonary:  Effort: Pulmonary effort is normal. No respiratory distress.     Breath sounds: Normal breath sounds.  Abdominal:     Palpations: Abdomen is soft.     Tenderness: There is abdominal tenderness. There is no right CVA tenderness, left CVA tenderness or guarding.    Musculoskeletal:     Cervical back: Neck supple.     Right lower leg: No edema.     Left lower leg: No edema.  Lymphadenopathy:     Cervical: No cervical adenopathy.  Skin:    General: Skin is warm and dry.  Neurological:     Mental Status: She is alert.     Comments: Sensation grossly intact to light touch in the lower extremities bilaterally. No saddle anesthesias. Strength 5/5 in the bilateral lower extremities. No noted gait  deficit. Coordination intact.  Psychiatric:        Mood and Affect: Mood and affect normal.        Speech: Speech normal.        Behavior: Behavior normal.     ED Results / Procedures / Treatments   Labs (all labs ordered are listed, but only abnormal results are displayed) Labs Reviewed  CBC - Abnormal; Notable for the following components:      Result Value   Hemoglobin 15.2 (*)    HCT 46.4 (*)    MCV 101.8 (*)    All other components within normal limits  URINALYSIS, ROUTINE W REFLEX MICROSCOPIC - Abnormal; Notable for the following components:   Color, Urine ORANGE (*)    Specific Gravity, Urine <1.005 (*)    Glucose, UA 250 (*)    Bilirubin Urine SMALL (*)    Protein, ur 30 (*)    Nitrite POSITIVE (*)    All other components within normal limits  URINALYSIS, MICROSCOPIC (REFLEX) - Abnormal; Notable for the following components:   Bacteria, UA RARE (*)    All other components within normal limits  COMPREHENSIVE METABOLIC PANEL - Abnormal; Notable for the following components:   Sodium 134 (*)    CO2 19 (*)    All other components within normal limits  SARS CORONAVIRUS 2 BY RT PCR (HOSPITAL ORDER, PERFORMED IN [facility])  LIPASE, BLOOD    EKG None  Radiology CT ABDOMEN PELVIS W CONTRAST  Result Date: 03/25/2020 CLINICAL DATA:  Abdominal pain.  LEFT-sided abdominal pain EXAM: CT ABDOMEN AND PELVIS WITH CONTRAST TECHNIQUE: Multidetector CT imaging of the abdomen and pelvis was performed using the standard protocol following bolus administration of intravenous contrast. CONTRAST:  OMNIPAQUE IOHEXOL 300 MG/ML  SOLN COMPARISON:  None. FINDINGS: Lower chest: Lung bases are clear. Hepatobiliary: Multiple small hypodense lesions in liver likely represent small cysts. Gallbladder normal. Pancreas: Pancreas is normal. No ductal dilatation. No pancreatic inflammation. Spleen: Normal spleen Adrenals/urinary tract: Adrenal glands and kidneys are normal. The  ureters and bladder normal. Stomach/Bowel: Stomach, duodenum small-bowel. Cecum and appendix are normal. Ascending transverse colon normal. Descending colon normal. There is inflammatory process along the anti mesenteric wall of the proximal sigmoid colon. This inflammation occurs over approximately 5 cm segment (image 65/2). There appears to be small abscess within the wall of the sigmoid colon with a rim enhancing low-density collection measuring 1.7 cm image 65/2. This is through a region of extensive diverticulosis. NO frank perforation or extraluminal abscess. Vascular/Lymphatic: Abdominal aorta is normal caliber with atherosclerotic calcification. There is no retroperitoneal or periportal lymphadenopathy. No pelvic lymphadenopathy. Reproductive: Post hysterectomy no adnexal abnormality Other:  No free fluid. Musculoskeletal: No aggressive osseous lesion. IMPRESSION: 1. Acute diverticulitis of the proximal sigmoid with intramural abscess. NO frank perforation or extraluminal abscess identified. 2. As there is asymmetric thickening through the sigmoid colon recommend follow-up imaging to exclude an unlikely underlying neoplasm. Electronically Signed   By: Genevive BiStewart  Edmunds M.D.   On: 03/25/2020 15:46    Procedures Procedures (including critical care time)  Medications Ordered in ED Medications  sodium chloride flush (NS) 0.9 % injection 3 mL (3 mLs Intravenous Not Given 03/25/20 1327)  cefTRIAXone (ROCEPHIN) 2 g in sodium chloride 0.9 % 100 mL IVPB (0 g Intravenous Stopped 03/25/20 1702)    And  metroNIDAZOLE (FLAGYL) IVPB 500 mg (500 mg Intravenous New Bag/Given 03/25/20 1706)  0.9 %  sodium chloride infusion ( Intravenous Stopped 03/25/20 1703)  hydrALAZINE (APRESOLINE) tablet 25 mg (25 mg Oral Given 03/25/20 1701)  ketorolac (TORADOL) 30 MG/ML injection 15 mg (15 mg Intravenous Given 03/25/20 1404)  ondansetron (ZOFRAN) injection 4 mg (4 mg Intravenous Given 03/25/20 1404)  iohexol (OMNIPAQUE) 300 MG/ML  solution 100 mL (100 mLs Intravenous Contrast Given 03/25/20 1502)  fentaNYL (SUBLIMAZE) injection 50 mcg (50 mcg Intravenous Given 03/25/20 1638)  sodium chloride 0.9 % bolus 1,000 mL (1,000 mLs Intravenous New Bag/Given 03/25/20 1706)    ED Course  I have reviewed the triage vital signs and the nursing notes.  Pertinent labs & imaging results that were available during my care of the patient were reviewed by me and considered in my medical decision making (see chart for details).  Clinical Course as of Mar 25 1713  Tue Mar 25, 2020  1500 Has been taking AZO.  Color, Urine(!): ORANGE [SJ]  1519 Patient states she feels much better.  Rates her pain 5/10.   [SJ]  [id number] Spoke with Dr. Donell BeersByerly, general surgery.  Recommends medicine admission. Agrees with choice of antibiotics, ceftriaxone and flagyl. Oral pain meds ok. No oral ABX. She may have sips of clear liquids and ice chips.   [SJ]  1652 Spoke with Dr. Marland McalpineSheikh, hospitalist. Agrees to admit the patient. [facility] He requests we order medication for her blood pressure. Suggests hydralazine. IV hydralazine is on backorder. He states oral hydralazine is acceptable.   [SJ]    Clinical Course User Index [SJ] Zoye Chandra, Hillard DankerShawn C, PA-C   MDM Rules/Calculators/A&P                          Patient presents with abdominal pain for the last three days.  Patient is nontoxic appearing, afebrile, not tachycardic, not tachypneic, not hypotensive, maintains excellent SPO2 on room air.   I reviewed and interpreted the patient's labs and radiological studies. No leukocytosis. CT with evidence of diverticulitis with intramural abscess. We will admit the patient for IV antibiotics.   Patient's hypertension is noted.  Patient denies any history of hypertension, however, she also states she "does not like going to the doctor."  My suspicion for hypertensive emergency is low as patient is asymptomatic.  Findings and  plan of care discussed with Vanetta MuldersScott Zackowski, MD. Dr. Deretha EmoryZackowski personally evaluated and examined this patient.    Patient's description of pain and its pattern gave suspicion for possible renal colic.  Additionally, patient was opposed to IV narcotic analgesics, specifically morphine, because she is concerned about the side effects and possible complications.  For these reasons, Toradol was chosen as initial analgesic.  Vitals:  03/25/20 1240 03/25/20 1444 03/25/20 1516 03/25/20 1703  BP: (!) 178/107 (!) 168/91 (!) 189/102 (!) 148/96  Pulse:  78 79 70  Resp:  18 16 16   Temp:      TempSrc:      SpO2:  98% 99%   Weight:      Height:         Final Clinical Impression(s) / ED Diagnoses Final diagnoses:  Diverticulitis    Rx / DC Orders ED Discharge Orders    None       , PA-C 03/27/20 05/28/20    0277, MD 03/29/20 1512

## 2020-03-25 NOTE — ED Notes (Signed)
Pt c/o L low back pain radiating into the LLQ. States she has pressure when she urinates. She started taking AZO without relief. Denies vaginal discharge.

## 2020-03-25 NOTE — Plan of Care (Signed)

## 2020-03-26 DIAGNOSIS — K5720 Diverticulitis of large intestine with perforation and abscess without bleeding: Principal | ICD-10-CM

## 2020-03-26 LAB — CBC
HCT: 39.6 % (ref 36.0–46.0)
Hemoglobin: 12.7 g/dL (ref 12.0–15.0)
MCH: 33.2 pg (ref 26.0–34.0)
MCHC: 32.1 g/dL (ref 30.0–36.0)
MCV: 103.4 fL — ABNORMAL HIGH (ref 80.0–100.0)
Platelets: 171 10*3/uL (ref 150–400)
RBC: 3.83 MIL/uL — ABNORMAL LOW (ref 3.87–5.11)
RDW: 13.4 % (ref 11.5–15.5)
WBC: 6 10*3/uL (ref 4.0–10.5)
nRBC: 0 % (ref 0.0–0.2)

## 2020-03-26 LAB — BASIC METABOLIC PANEL
Anion gap: 9 (ref 5–15)
BUN: 10 mg/dL (ref 8–23)
CO2: 21 mmol/L — ABNORMAL LOW (ref 22–32)
Calcium: 9 mg/dL (ref 8.9–10.3)
Chloride: 107 mmol/L (ref 98–111)
Creatinine, Ser: 0.79 mg/dL (ref 0.44–1.00)
GFR calc Af Amer: >60 mL/min (ref >60)
GFR calc non Af Amer: >60 mL/min (ref >60)
Glucose, Bld: 87 mg/dL (ref 70–99)
Potassium: 3.8 mmol/L (ref 3.5–5.1)
Sodium: 137 mmol/L (ref 135–145)

## 2020-03-26 LAB — HIV ANTIBODY (ROUTINE TESTING W REFLEX): HIV Screen 4th Generation wRfx: NONREACTIVE

## 2020-03-26 MED ORDER — LABETALOL HCL 5 MG/ML IV SOLN
10.0000 mg | INTRAVENOUS | Status: DC | PRN
  Administered 2020-03-27: 5 mg via INTRAVENOUS
  Administered 2020-03-27: 10 mg via INTRAVENOUS
  Filled 2020-03-26 (×2): qty 4

## 2020-03-26 MED ORDER — HYDROMORPHONE HCL 1 MG/ML IJ SOLN
0.5000 mg | INTRAMUSCULAR | Status: DC | PRN
  Administered 2020-03-26 – 2020-03-28 (×7): 0.5 mg via INTRAVENOUS
  Filled 2020-03-26 (×7): qty 0.5

## 2020-03-26 MED ORDER — HYDROXYZINE HCL 25 MG PO TABS
25.0000 mg | ORAL_TABLET | ORAL | Status: DC | PRN
  Administered 2020-03-26 – 2020-03-27 (×3): 25 mg via ORAL
  Filled 2020-03-26 (×3): qty 1

## 2020-03-26 MED ORDER — ACETAMINOPHEN 325 MG PO TABS: 650.0000 mg | ORAL_TABLET | Freq: Four times a day (QID) | ORAL | Status: DC | PRN

## 2020-03-26 MED ORDER — OXYCODONE HCL 5 MG PO TABS
5.0000 mg | ORAL_TABLET | ORAL | Status: DC | PRN
  Administered 2020-03-26: 5 mg via ORAL
  Filled 2020-03-26: qty 1

## 2020-03-26 MED ORDER — OXYCODONE HCL 5 MG PO TABS: 5.0000 mg | ORAL_TABLET | ORAL | Status: DC | PRN

## 2020-03-26 MED ORDER — ACETAMINOPHEN 325 MG PO TABS: 650.0000 mg | ORAL_TABLET | ORAL | Status: DC | PRN

## 2020-03-26 NOTE — Progress Notes (Signed)
PROGRESS NOTE    Morgan Sparks   MMN:[id number]  DOB: [birth year]  DOA: 03/25/2020     1  PCP: Guadlupe Spanish, MD  CC: Abdominal pain  Hospital Course: [name] is a 69 yo female with PMH diverticulosis, HLD who presented with abdominal pain.  She underwent CT abdomen/pelvis which revealed acute sigmoid diverticulitis with intramural abscess and no perforation.  She was started on Rocephin and Flagyl and admitted to the hospital.  Surgery was also consulted.   Interval History:  No events overnight.  Still having some lower abdominal pain but states it is better than admission.  Denies any nausea or vomiting.  She has been up and ambulating around the hallway well this morning.  Old records reviewed in assessment of this patient  ROS: Constitutional: negative for fatigue and fevers, Respiratory: negative for cough and sputum, Cardiovascular: negative for chest pain and Gastrointestinal: positive for abdominal pain  Assessment & Plan: Hypertension, essential -Labetalol or hydralazine as needed  Diverticulitis of large intestine with abscess -No previous history of diverticulitis.  CT shows sigmoid diverticulitis with abscess, no perforation -Continue Rocephin and Flagyl -Surgery also following, appreciate assistance -Okay for clear liquid diet at this time per surgery -Will likely need repeat CT in the next 24 to 48 hours to reassess abscess -Continue IV fluids  Hypercholesteremia Hold any statin for now   Antimicrobials: Rocephin 03/25/20 >> present Flagyl 03/25/20>> present  DVT prophylaxis: Lovenox Code Status: Full Family Communication: none Disposition Plan:  . Patient came from: Home        . Barriers to d/c OR conditions which need to be met to effect a safe d/c: none . The current disposition plan is discharge to: home   Objective: Vitals:   03/26/20 0222 03/26/20 0619 03/26/20 0950 03/26/20 1404  BP: (!) 142/77 (!) 152/84 136/66 110/64  Pulse: 66 (!) 56   (!) 55  Resp: 18 18  15   Temp: 97.8 F (36.6 C) 97.7 F (36.5 C)  97.8 F (36.6 C)  TempSrc: Oral Oral  Oral  SpO2: 100% 96%  96%  Weight:      Height:        Intake/Output Summary (Last 24 hours) at 03/26/2020 1735 Last data filed at 03/26/2020 1100 Gross per 24 hour  Intake 3333.03 ml  Output --  Net 3333.03 ml   Filed Weights   03/25/20 1239 03/25/20 1832  Weight: 66.2 kg 66.9 kg    Examination: General appearance: alert, cooperative and no distress Head: Normocephalic, without obvious abnormality Eyes: EOMI Lungs: clear to auscultation bilaterally Heart: regular rate and rhythm and S1, S2 normal Abdomen: soft, TTP in bilateral lower quadrants, no R/G. BS present Extremities: no edema Skin: mobility and turgor normal Neurologic: Grossly normal   Consultants:   Surgery  Procedures:   none  Data Reviewed: I have personally reviewed following labs and imaging studies Results for orders placed or performed during the hospital encounter of 03/25/20 (from the past 24 hour(s))  HIV Antibody (routine testing w rflx)     Status: None   Collection Time: 03/26/20  5:00 AM  Result Value Ref Range   HIV Screen 4th Generation wRfx Non Reactive Non Reactive  Basic metabolic panel     Status: Abnormal   Collection Time: 03/26/20  5:00 AM  Result Value Ref Range   Sodium 137 135 - 145 mmol/L   Potassium 3.8 3.5 - 5.1 mmol/L   Chloride 107 98 - 111 mmol/L  CO2 21 (L) 22 - 32 mmol/L   Glucose, Bld 87 70 - 99 mg/dL   BUN 10 8 - 23 mg/dL   Creatinine, Ser 0.79 0.44 - 1.00 mg/dL   Calcium 9.0 8.9 - 10.3 mg/dL   GFR calc non Af Amer >60 >60 mL/min   GFR calc Af Amer >60 >60 mL/min   Anion gap 9 5 - 15  CBC     Status: Abnormal   Collection Time: 03/26/20  5:00 AM  Result Value Ref Range   WBC 6.0 4.0 - 10.5 K/uL   RBC 3.83 (L) 3.87 - 5.11 MIL/uL   Hemoglobin 12.7 12.0 - 15.0 g/dL   HCT 39.6 36 - 46 %   MCV 103.4 (H) 80.0 - 100.0 fL   MCH 33.2 26.0 - 34.0 pg   MCHC  32.1 30.0 - 36.0 g/dL   RDW 13.4 11.5 - 15.5 %   Platelets 171 150 - 400 K/uL   nRBC 0.0 0.0 - 0.2 %    Recent Results (from the past 240 hour(s))  SARS Coronavirus 2 by RT PCR (hospital order, performed in [place] hospital lab) Nasopharyngeal Nasopharyngeal Swab     Status: None   Collection Time: 03/25/20  4:42 PM   Specimen: Nasopharyngeal Swab  Result Value Ref Range Status   SARS Coronavirus 2 NEGATIVE NEGATIVE Final    Comment: (NOTE) SARS-CoV-2 target nucleic acids are NOT DETECTED.  The SARS-CoV-2 RNA is generally detectable in upper and lower respiratory specimens during the acute phase of infection. The lowest concentration of SARS-CoV-2 viral copies this assay can detect is 250 copies / mL. A negative result does not preclude SARS-CoV-2 infection and should not be used as the sole basis for treatment or other patient management decisions.  A negative result may occur with improper specimen collection / handling, submission of specimen other than nasopharyngeal swab, presence of viral mutation(s) within the areas targeted by this assay, and inadequate number of viral copies (<250 copies / mL). A negative result must be combined with clinical observations, patient history, and epidemiological information.  Fact Sheet for Patients:   StrictlyIdeas.no  Fact Sheet for Healthcare Providers: BankingDealers.co.za  This test is not yet approved or  cleared by the [place] FDA and has been authorized for detection and/or diagnosis of SARS-CoV-2 by FDA under an Emergency Use Authorization (EUA).  This EUA will remain in effect (meaning this test can be used) for the duration of the COVID-19 declaration under Section 564(b)(1) of the Act, 21 U.S.C. section 360bbb-3(b)(1), unless the authorization is terminated or revoked sooner.  Performed at [facility], [address] [place], [place] [address]       Radiology Studies: CT ABDOMEN PELVIS W CONTRAST  Result Date: 03/25/2020 CLINICAL DATA:  Abdominal pain.  LEFT-sided abdominal pain EXAM: CT ABDOMEN AND PELVIS WITH CONTRAST TECHNIQUE: Multidetector CT imaging of the abdomen and pelvis was performed using the standard protocol following bolus administration of intravenous contrast. CONTRAST:  12m OMNIPAQUE IOHEXOL 300 MG/ML  SOLN COMPARISON:  None. FINDINGS: Lower chest: Lung bases are clear. Hepatobiliary: Multiple small hypodense lesions in liver likely represent small cysts. Gallbladder normal. Pancreas: Pancreas is normal. No ductal dilatation. No pancreatic inflammation. Spleen: Normal spleen Adrenals/urinary tract: Adrenal glands and kidneys are normal. The ureters and bladder normal. Stomach/Bowel: Stomach, duodenum small-bowel. Cecum and appendix are normal. Ascending transverse colon normal. Descending colon normal. There is inflammatory process along the anti mesenteric wall of the proximal sigmoid  colon. This inflammation occurs over approximately 5 cm segment (image 65/2). There appears to be small abscess within the wall of the sigmoid colon with a rim enhancing low-density collection measuring 1.7 cm image 65/2. This is through a region of extensive diverticulosis. NO frank perforation or extraluminal abscess. Vascular/Lymphatic: Abdominal aorta is normal caliber with atherosclerotic calcification. There is no retroperitoneal or periportal lymphadenopathy. No pelvic lymphadenopathy. Reproductive: Post hysterectomy no adnexal abnormality Other: No free fluid. Musculoskeletal: No aggressive osseous lesion. IMPRESSION: 1. Acute diverticulitis of the proximal sigmoid with intramural abscess. NO frank perforation or extraluminal abscess identified. 2. As there is asymmetric thickening through the sigmoid colon recommend follow-up imaging to exclude an unlikely underlying neoplasm. Electronically Signed   By: Suzy Bouchard M.D.   On: 03/25/2020  15:46   CT ABDOMEN PELVIS W CONTRAST  Final Result       Scheduled Meds: . enoxaparin (LOVENOX) injection  40 mg Subcutaneous Q24H  . hydrALAZINE  25 mg Oral Q8H  . lisinopril  10 mg Oral Daily   PRN Meds: acetaminophen, HYDROmorphone (DILAUDID) injection, hydrOXYzine, labetalol, ondansetron **OR** ondansetron (ZOFRAN) IV, oxyCODONE Continuous Infusions: . cefTRIAXone (ROCEPHIN)  IV 2 g (03/26/20 1501)  . lactated ringers 100 mL/hr at 03/26/20 1217  . metronidazole 500 mg (03/26/20 1536)      LOS: 1 day  Time spent: Greater than 50% of the 35 minute visit was spent in counseling/coordination of care for the patient as laid out in the A&P.   Dwyane Dee, MD [facility] 03/26/2020, 5:35 PM   Contact via secure chat.  To contact the attending provider between 7A-7P or the covering provider during after hours 7P-7A, please log into the web site [email or web address] and access using universal [place] password for that web site. If you do not have the password, please call the hospital operator.

## 2020-03-26 NOTE — Assessment & Plan Note (Addendum)
-  Treated with lisinopril during hospitalization.  No prior history of hypertension and patient states blood pressure readings are normal outpatient.  She denied pain playing a significant role in her blood pressure during the hospitalization -Prescription provided for lisinopril at discharge and patient instructed to monitor her blood pressure closely.  She will follow-up with PCP regarding blood pressure levels as well.  She knows to discontinue if blood pressure becomes low and to continue if her blood pressure tolerates.

## 2020-03-26 NOTE — Assessment & Plan Note (Addendum)
-  Vytorin resumed at discharge

## 2020-03-26 NOTE — Progress Notes (Signed)
[facility] Progress Note     Subjective: Patient reports LLQ pain but states it is improved from overnight. She denies nausea. No flatus or BM and also reports decreased urination this AM.   Objective: Vital signs in last 24 hours: Temp:  [97.5 F (36.4 C)-98.6 F (37 C)] 97.7 F (36.5 C) (07/07 0619) Pulse Rate:  [53-81] 56 (07/07 0619) Resp:  [16-20] 18 (07/07 0619) BP: (136-204)/(66-107) 136/66 (07/07 0950) SpO2:  [95 %-100 %] 96 % (07/07 0619) Weight:  [66.2 kg-66.9 kg] 66.9 kg (07/06 1832) Last BM Date: 03/25/20  Intake/Output from previous day: 07/06 0701 - 07/07 0700 In: 2340.7 [P.O.:240; I.V.:969.3; IV Piggyback:1131.4] Out: -  Intake/Output this shift: Total I/O In: 240 [P.O.:240] Out: -   PE: General: pleasant, WD, WN female who is laying in bed in NAD HEENT:  Sclera are noninjected.  PERRL.  Ears and nose without any masses or lesions.  Mouth is pink and moist Heart: regular, rate, and rhythm. Palpable radial and pedal pulses bilaterally Lungs: CTAB, no wheezes, rhonchi, or rales noted.  Respiratory effort nonlabored Abd: soft, ttp in LLQ, no peritonitis, ND, +BS, no masses, hernias, or organomegaly MS: all 4 extremities are symmetrical with no cyanosis, clubbing, or edema. Skin: warm and dry with no masses, lesions, or rashes Neuro: Cranial nerves 2-12 grossly intact, sensation grossly intact throughout Psych: A&Ox3 with an appropriate affect.   Lab Results:  Recent Labs    03/25/20 1349 03/26/20 0500  WBC 8.4 6.0  HGB 15.2* 12.7  HCT 46.4* 39.6  PLT 212 171   BMET Recent Labs    03/25/20 1420 03/26/20 0500  NA 134* 137  K 4.1 3.8  CL 103 107  CO2 19* 21*  GLUCOSE 91 87  BUN 10 10  CREATININE 0.86 0.79  CALCIUM 9.4 9.0   PT/INR No results for input(s): LABPROT, INR in the last 72 hours. CMP     Component Value Date/Time   NA 137 03/26/2020 0500   K 3.8 03/26/2020 0500   CL 107 03/26/2020 0500   CO2 21 (L) 03/26/2020  0500   GLUCOSE 87 03/26/2020 0500   BUN 10 03/26/2020 0500   CREATININE 0.79 03/26/2020 0500   CALCIUM 9.0 03/26/2020 0500   PROT 7.1 03/25/2020 1420   ALBUMIN 4.2 03/25/2020 1420   AST 17 03/25/2020 1420   ALT 13 03/25/2020 1420   ALKPHOS 80 03/25/2020 1420   BILITOT 0.8 03/25/2020 1420   GFRNONAA >60 03/26/2020 0500   GFRAA >60 03/26/2020 0500   Lipase     Component Value Date/Time   LIPASE 18 03/25/2020 1420       Studies/Results: CT ABDOMEN PELVIS W CONTRAST  Result Date: 03/25/2020 CLINICAL DATA:  Abdominal pain.  LEFT-sided abdominal pain EXAM: CT ABDOMEN AND PELVIS WITH CONTRAST TECHNIQUE: Multidetector CT imaging of the abdomen and pelvis was performed using the standard protocol following bolus administration of intravenous contrast. CONTRAST:  OMNIPAQUE IOHEXOL 300 MG/ML  SOLN COMPARISON:  None. FINDINGS: Lower chest: Lung bases are clear. Hepatobiliary: Multiple small hypodense lesions in liver likely represent small cysts. Gallbladder normal. Pancreas: Pancreas is normal. No ductal dilatation. No pancreatic inflammation. Spleen: Normal spleen Adrenals/urinary tract: Adrenal glands and kidneys are normal. The ureters and bladder normal. Stomach/Bowel: Stomach, duodenum small-bowel. Cecum and appendix are normal. Ascending transverse colon normal. Descending colon normal. There is inflammatory process along the anti mesenteric wall of the proximal sigmoid colon. This inflammation occurs over approximately 5 cm segment (image  65/2). There appears to be small abscess within the wall of the sigmoid colon with a rim enhancing low-density collection measuring 1.7 cm image 65/2. This is through a region of extensive diverticulosis. NO frank perforation or extraluminal abscess. Vascular/Lymphatic: Abdominal aorta is normal caliber with atherosclerotic calcification. There is no retroperitoneal or periportal lymphadenopathy. No pelvic lymphadenopathy. Reproductive: Post hysterectomy  no adnexal abnormality Other: No free fluid. Musculoskeletal: No aggressive osseous lesion. IMPRESSION: 1. Acute diverticulitis of the proximal sigmoid with intramural abscess. NO frank perforation or extraluminal abscess identified. 2. As there is asymmetric thickening through the sigmoid colon recommend follow-up imaging to exclude an unlikely underlying neoplasm. Electronically Signed   By: Genevive Bi M.D.   On: 03/25/2020 15:46    Anti-infectives: Anti-infectives (From admission, onward)   Start     Dose/Rate Route Frequency Ordered Stop   03/26/20 1600  cefTRIAXone (ROCEPHIN) 2 g in sodium chloride 0.9 % 100 mL IVPB     Discontinue     2 g 200 mL/hr over 30 Minutes Intravenous Every 24 hours 03/25/20 2011     03/26/20 0000  metroNIDAZOLE (FLAGYL) IVPB 500 mg     Discontinue     500 mg 100 mL/hr over 60 Minutes Intravenous Every 8 hours 03/25/20 2011     03/25/20 1630  cefTRIAXone (ROCEPHIN) 2 g in sodium chloride 0.9 % 100 mL IVPB       "And" Linked Group Details   2 g 200 mL/hr over 30 Minutes Intravenous  Once 03/25/20 1616 03/25/20 2111   03/25/20 1630  metroNIDAZOLE (FLAGYL) IVPB 500 mg       "And" Linked Group Details   500 mg 100 mL/hr over 60 Minutes Intravenous  Once 03/25/20 1616 03/25/20 2112       Assessment/Plan HTN HLD  Sigmoid diverticulitis with intramural abscess - CT 7/6 consistent with above dx - WBC 6.0 from 8.4 on admit, afebrile - pt reports pain in LLQ but improved from admission - ok to have CLD - repeat AM labs tomorrow - ok to have PO pain meds - encourage OOB - no indication for acute surgical intervention at this time  FEN: CLD, IVF VTE: lovenox ID: rocephin/flagyl 7/6>>  LOS: 1 day    [name] , [facility] 03/26/2020, 10:43 AM Please see Amion for pager number during day hours 7:00am-4:30pm

## 2020-03-26 NOTE — Hospital Course (Addendum)
Ms. Norlander is a 69 yo female with PMH diverticulosis, HLD who presented with abdominal pain.  She underwent CT abdomen/pelvis which revealed acute sigmoid diverticulitis with intramural abscess (1.7 cm) and no signs of perforation.  She was started on Rocephin and Flagyl and admitted to the hospital.  Surgery was also consulted. She did have good clinical improvement with bowel rest initially followed by slow advancement of diet. Antibiotics and IVF were continued.   Her blood pressure remained elevated during hospitalization however she carried no prior diagnosis of HTN nor was she on medication at home. She was treated with lisinopril and discharged with a prescription in case of ongoing elevated BP at home. She was instructed to check her BP consistently and if it remained elevated to then continue the medication and discuss further with her PCP, and if her BP did become low then to discontinue the medication.   A repeat CT abd/pelvis was ordered on 03/28/20 for serial monitoring of her diverticulitis with abscess as is typical and appropriate management. Unfortunately, the patient became agitated and wished to be discharged prior to results of CT scan being back. She was prescribed cipro and flagyl to complete a total of 14 day course. She will follow up with her PCP and obtain referral to GI for a CLN after her diverticulitis episode has resolved. She was discharged home in stable condition.

## 2020-03-26 NOTE — Assessment & Plan Note (Addendum)
-  No previous history of diverticulitis.  CT shows sigmoid diverticulitis with abscess, no perforation -Continue Rocephin and Flagyl during inpatient -Surgery also following, appreciate assistance -tolerated diet advancement and pain continues to improve - overall improving; repeat CT abd/pelvis ordered for 03/28/20; patient became agitated and did not wish to remain for results; explained to patient prior to discharge that serial imaging is recommended for diverticulitis with abscess that has responded to abx; patient discharged with cipro/flagyl to complete 14 day course total - outpatient follow up with PCP for GI referral for CLN

## 2020-03-27 LAB — CBC WITH DIFFERENTIAL/PLATELET
Abs Immature Granulocytes: 0.01 10*3/uL (ref 0.00–0.07)
Basophils Absolute: 0 10*3/uL (ref 0.0–0.1)
Basophils Relative: 1 %
Eosinophils Absolute: 0 10*3/uL (ref 0.0–0.5)
Eosinophils Relative: 1 %
HCT: 40.8 % (ref 36.0–46.0)
Hemoglobin: 13.1 g/dL (ref 12.0–15.0)
Immature Granulocytes: 0 %
Lymphocytes Relative: 17 %
Lymphs Abs: 1 10*3/uL (ref 0.7–4.0)
MCH: 32.8 pg (ref 26.0–34.0)
MCHC: 32.1 g/dL (ref 30.0–36.0)
MCV: 102 fL — ABNORMAL HIGH (ref 80.0–100.0)
Monocytes Absolute: 0.6 10*3/uL (ref 0.1–1.0)
Monocytes Relative: 10 %
Neutro Abs: 4.1 10*3/uL (ref 1.7–7.7)
Neutrophils Relative %: 71 %
Platelets: 201 10*3/uL (ref 150–400)
RBC: 4 MIL/uL (ref 3.87–5.11)
RDW: 13.2 % (ref 11.5–15.5)
WBC: 5.8 10*3/uL (ref 4.0–10.5)
nRBC: 0 % (ref 0.0–0.2)

## 2020-03-27 LAB — BASIC METABOLIC PANEL
Anion gap: 10 (ref 5–15)
BUN: 8 mg/dL (ref 8–23)
CO2: 25 mmol/L (ref 22–32)
Calcium: 9.3 mg/dL (ref 8.9–10.3)
Chloride: 105 mmol/L (ref 98–111)
Creatinine, Ser: 0.85 mg/dL (ref 0.44–1.00)
GFR calc Af Amer: >60 mL/min (ref >60)
GFR calc non Af Amer: >60 mL/min (ref >60)
Glucose, Bld: 79 mg/dL (ref 70–99)
Potassium: 3.8 mmol/L (ref 3.5–5.1)
Sodium: 140 mmol/L (ref 135–145)

## 2020-03-27 LAB — MAGNESIUM: Magnesium: 1.9 mg/dL (ref 1.7–2.4)

## 2020-03-27 MED ORDER — LISINOPRIL 20 MG PO TABS
40.0000 mg | ORAL_TABLET | Freq: Every day | ORAL | Status: DC
  Administered 2020-03-28: 40 mg via ORAL
  Filled 2020-03-27: qty 2

## 2020-03-27 MED ORDER — LISINOPRIL 20 MG PO TABS
20.0000 mg | ORAL_TABLET | Freq: Once | ORAL | Status: AC
  Administered 2020-03-27: 20 mg via ORAL

## 2020-03-27 MED ORDER — ALBUTEROL SULFATE (2.5 MG/3ML) 0.083% IN NEBU
2.5000 mg | INHALATION_SOLUTION | RESPIRATORY_TRACT | Status: DC | PRN
  Administered 2020-03-27: 2.5 mg via RESPIRATORY_TRACT
  Filled 2020-03-27: qty 3

## 2020-03-27 NOTE — Discharge Instructions (Signed)
Diverticulitis  Diverticulitis is when small pockets in your large intestine (colon) get infected or swollen. This causes stomach pain and watery poop (diarrhea). These pouches are called diverticula. They form in people who have a condition called diverticulosis. Follow these instructions at home: Medicines  Take over-the-counter and prescription medicines only as told by your doctor. These include: ? Antibiotics. ? Pain medicines. ? Fiber pills. ? Probiotics. ? Stool softeners.  Do not drive or use heavy machinery while taking prescription pain medicine.  If you were prescribed an antibiotic, take it as told. Do not stop taking it even if you feel better. General instructions   Follow a diet as told by your doctor.  When you feel better, your doctor may tell you to change your diet. You may need to eat a lot of fiber. Fiber makes it easier to poop (have bowel movements). Healthy foods with fiber include: ? Berries. ? Beans. ? Lentils. ? Green vegetables.  Exercise 3 or more times a week. Aim for 30 minutes each time. Exercise enough to sweat and make your heart beat faster.  Keep all follow-up visits as told. This is important. You may need to have an exam of the large intestine. This is called a colonoscopy. Contact a doctor if:  Your pain does not get better.  You have a hard time eating or drinking.  You are not pooping like normal. Get help right away if:  Your pain gets worse.  Your problems do not get better.  Your problems get worse very fast.  You have a fever.  You throw up (vomit) more than one time.  You have poop that is: ? Bloody. ? Black. ? Tarry. Summary  Diverticulitis is when small pockets in your large intestine (colon) get infected or swollen.  Take medicines only as told by your doctor.  Follow a diet as told by your doctor. This information is not intended to replace advice given to you by your health care provider. Make sure you  discuss any questions you have with your health care provider. Document Revised: 08/19/2017 Document Reviewed: 09/23/2016 Elsevier Patient Education  2020 Elsevier Inc.   Low-Fiber Eating Plan Fiber is found in fruits, vegetables, whole grains, and beans. Eating a diet low in fiber helps to reduce how often you have bowel movements and how much you produce during a bowel movement. A low-fiber eating plan may help your digestive system heal if:  You have certain conditions, such as Crohn's disease or diverticulitis.  You recently had radiation therapy on your pelvis or bowel.  You recently had intestinal surgery.  You have a new surgical opening in your abdomen (colostomy or ileostomy).  Your intestine is narrowed (stricture). Your health care provider will determine how long you need to stay on this diet. Your health care provider may recommend that you work with a diet and nutrition specialist (dietitian). What are tips for following this plan? General guidelines  Follow recommendations from your dietitian about how much fiber you should have each day.  Most people on this eating plan should try to eat less than 10 grams (g) of fiber each day. Your daily fiber goal is _________________ g.  Take vitamin and mineral supplements as told by your health care provider or dietitian. Chewable or liquid forms are best when on this eating plan. Reading food labels  Check food labels for the amount of dietary fiber.  Choose foods that have less than 2 grams of fiber in one serving.   Cooking  Use white flour and other allowed grains for baking and cooking.  Cook meat using methods that keep it tender, such as braising or poaching.  Cook eggs until the yolk is completely solid.  Cook with healthy oils, such as olive oil or canola oil. Meal planning   Eat 5-6 small meals throughout the day instead of 3 large meals.  If you are lactose intolerant: ? Choose low-lactose dairy  foods. ? Do not eat dairy foods, if told by your dietitian.  Limit fat and oils to less than 8 teaspoons a day.  Eat small portions of desserts. What foods are allowed? The items listed below may not be a complete list. Talk with your dietitian about what dietary choices are best for you. Grains All bread and crackers made with white flour. Waffles, pancakes, and French toast. Bagels. Pretzels. Melba toast, zwieback, and matzoh. Cooked and dried cereals that do not contain whole grains, added fiber, seeds, or dried fruit. Cornmeal. [place]. Hot and cold cereals made with refined corn, wheat, rice, or oats. Plain pasta and noodles. White rice. Vegetables Well-cooked or canned vegetables without skin, seeds, or stems. Cooked potatoes without skins. Vegetable juice. Fruits Soft-cooked or canned fruits without skin and seeds. Peeled ripe banana. Applesauce. Fruit juice without pulp. Meats and other protein foods Ground meat. Tender cuts of meat or poultry. Eggs. Fish, seafood, and shellfish. Smooth nut butters. Tofu. Dairy All milk products and drinks. Lactose-free milks, including rice, soy, and almond milks. Yogurt without fruit, nuts, chocolate, or granola mix-ins. Sour cream. Cottage cheese. Cheese. Beverages Decaf coffee. Fruit and vegetable juices or smoothies (in small amounts, with no pulp or skins, and with fruits from allowed list). Sports drinks. Herbal tea. Fats and oils Olive oil, canola oil, sunflower oil, flaxseed oil, and grapeseed oil. Mayonnaise. Cream cheese. Margarine. Butter. Sweets and desserts Plain cakes and cookies. Cream pies and pies made with allowed fruits. Pudding. Custard. Fruit gelatin. Sherbet. Popsicles. Ice cream without nuts. Plain hard candy. Honey. Jelly. Molasses. Syrups, including chocolate syrup. Chocolate. Marshmallows. Gumdrops. Seasoning and other foods Bouillon. Broth. Cream soups made from allowed foods. Strained soup. Casseroles made with allowed  foods. Ketchup. Mild mustard. Mild salad dressings. Plain gravies. Vinegar. Spices in moderation. Salt. Sugar. What foods are not allowed? The items listed below may not be a complete list. Talk with your dietitian about what dietary choices are best for you. Grains Whole wheat and whole grain breads and crackers. Multigrain breads and crackers. Rye bread. Whole grain or multigrain cereals. Cereals with nuts, raisins, or coconut. Bran. Coarse wheat cereals. Granola. High-fiber cereals. Cornmeal or corn bread. Whole grain pasta. Wild or brown rice. Quinoa. Popcorn. Buckwheat. Wheat germ. Vegetables Potato skins. Raw or undercooked vegetables. All beans and bean sprouts. Cooked greens. Corn. Peas. Cabbage. Beets. Broccoli. Brussels sprouts. Cauliflower. Mushrooms. Onions. Peppers. Parsnips. Okra. Sauerkraut. Fruit Raw or dried fruit. Berries. Fruit juice with pulp. Prune juice. Meats and other protein foods Tough, fibrous meats with gristle. Fatty meat. Poultry with skin. Fried meat, poultry, or fish. Deli or lunch meats. Sausage, bacon, and hot dogs. Nuts and chunky nut butter. Dried peas, beans, and lentils. Dairy Yogurt with fruit, nuts, chocolate, or granola mix-ins. Beverages Caffeinated coffee and teas. Fats and oils Avocado. Coconut. Sweets and desserts Desserts, cookies, or candies that contain nuts or coconut. Dried fruit. Jams and preserves with seeds. Marmalade. Any dessert made with fruits or grains that are not allowed. Seasoning and other foods Corn tortilla chips.   Soups made with vegetables or grains that are not allowed. Relish. Horseradish. Pickles. Olives. Summary  Most people on a low-fiber eating plan should eat less than 10 grams of fiber a day. Follow recommendations from your dietitian about how much fiber you should have each day.  Always check food labels to see the dietary fiber content of packaged foods. In general, a low-fiber food will have fewer than 2 grams of  fiber per serving.  In general, try to avoid whole grains, raw fruits and vegetables, dried fruit, tough cuts of meat, nuts, and seeds.  Take a vitamin and mineral supplement as told by your health care provider or dietitian. This information is not intended to replace advice given to you by your health care provider. Make sure you discuss any questions you have with your health care provider. Document Revised: 12/29/2018 Document Reviewed: 11/09/2016 Elsevier Patient Education  2020 Elsevier Inc.   High-Fiber Diet Fiber, also called dietary fiber, is a type of carbohydrate that is found in fruits, vegetables, whole grains, and beans. A high-fiber diet can have many health benefits. Your health care provider may recommend a high-fiber diet to help:  Prevent constipation. Fiber can make your bowel movements more regular.  Lower your cholesterol.  Relieve the following conditions: ? Swelling of veins in the anus (hemorrhoids). ? Swelling and irritation (inflammation) of specific areas of the digestive tract (uncomplicated diverticulosis). ? A problem of the large intestine (colon) that sometimes causes pain and diarrhea (irritable bowel syndrome, IBS).  Prevent overeating as part of a weight-loss plan.  Prevent heart disease, type 2 diabetes, and certain cancers. What is my plan? The recommended daily fiber intake in grams (g) includes:  38 g for men age 50 or younger.  30 g for men over age 50.  25 g for women age 50 or younger.  21 g for women over age 50. You can get the recommended daily intake of dietary fiber by:  Eating a variety of fruits, vegetables, grains, and beans.  Taking a fiber supplement, if it is not possible to get enough fiber through your diet. What do I need to know about a high-fiber diet?  It is better to get fiber through food sources rather than from fiber supplements. There is not a lot of research about how effective supplements are.  Always  check the fiber content on the nutrition facts label of any prepackaged food. Look for foods that contain 5 g of fiber or more per serving.  Talk with a diet and nutrition specialist (dietitian) if you have questions about specific foods that are recommended or not recommended for your medical condition, especially if those foods are not listed below.  Gradually increase how much fiber you consume. If you increase your intake of dietary fiber too quickly, you may have bloating, cramping, or gas.  Drink plenty of water. Water helps you to digest fiber. What are tips for following this plan?  Eat a wide variety of high-fiber foods.  Make sure that half of the grains that you eat each day are whole grains.  Eat breads and cereals that are made with whole-grain flour instead of refined flour or white flour.  Eat brown rice, bulgur wheat, or millet instead of white rice.  Start the day with a breakfast that is high in fiber, such as a cereal that contains 5 g of fiber or more per serving.  Use beans in place of meat in soups, salads, and pasta dishes.    Eat high-fiber snacks, such as berries, raw vegetables, nuts, and popcorn.  Choose whole fruits and vegetables instead of processed forms like juice or sauce. What foods can I eat?  Fruits Berries. Pears. Apples. Oranges. Avocado. Prunes and raisins. Dried figs. Vegetables Sweet potatoes. Spinach. Kale. Artichokes. Cabbage. Broccoli. Cauliflower. Green peas. Carrots. Squash. Grains Whole-grain breads. Multigrain cereal. Oats and oatmeal. Brown rice. Barley. Bulgur wheat. Millet. Quinoa. Bran muffins. Popcorn. Rye wafer crackers. Meats and other proteins Navy, kidney, and pinto beans. Soybeans. Split peas. Lentils. Nuts and seeds. Dairy Fiber-fortified yogurt. Beverages Fiber-fortified soy milk. Fiber-fortified orange juice. Other foods Fiber bars. The items listed above may not be a complete list of recommended foods and beverages.  Contact a dietitian for more options. What foods are not recommended? Fruits Fruit juice. Cooked, strained fruit. Vegetables Fried potatoes. Canned vegetables. Well-cooked vegetables. Grains White bread. Pasta made with refined flour. White rice. Meats and other proteins Fatty cuts of meat. Fried chicken or fried fish. Dairy Milk. Yogurt. Cream cheese. Sour cream. Fats and oils Butters. Beverages Soft drinks. Other foods Cakes and pastries. The items listed above may not be a complete list of foods and beverages to avoid. Contact a dietitian for more information. Summary  Fiber is a type of carbohydrate. It is found in fruits, vegetables, whole grains, and beans.  There are many health benefits of eating a high-fiber diet, such as preventing constipation, lowering blood cholesterol, helping with weight loss, and reducing your risk of heart disease, diabetes, and certain cancers.  Gradually increase your intake of fiber. Increasing too fast can result in cramping, bloating, and gas. Drink plenty of water while you increase your fiber.  The best sources of fiber include whole fruits and vegetables, whole grains, nuts, seeds, and beans. This information is not intended to replace advice given to you by your health care provider. Make sure you discuss any questions you have with your health care provider. Document Revised: 07/11/2017 Document Reviewed: 07/11/2017 Elsevier Patient Education  2020 Elsevier Inc.  

## 2020-03-27 NOTE — Plan of Care (Signed)
Discussed with patient in front of family plan of care for the evening, pain management and night time medications with some teach back displayed

## 2020-03-27 NOTE — Progress Notes (Signed)
PROGRESS NOTE    Morgan Sparks   YDX:[id number]  DOB: 1950-10-26  DOA: 03/25/2020     2  PCP: Guadlupe Spanish, MD  CC: Abdominal pain  Hospital Course: Morgan Sparks is a 69 yo female with PMH diverticulosis, HLD who presented with abdominal pain.  She underwent CT abdomen/pelvis which revealed acute sigmoid diverticulitis with intramural abscess and no perforation.  She was started on Rocephin and Flagyl and admitted to the hospital.  Surgery was also consulted.   Interval History:  No events overnight.  Continues to improve steadily throughout hospitalization.  Abdominal pain is minimal this morning and she continues to ambulate well in the hallway.  Denies any nausea/vomiting and bowel movements remain soft.  She has no other concerns or complaints and is hopeful for discharging home tomorrow.  Old records reviewed in assessment of this patient  ROS: Constitutional: negative for fatigue and fevers, Respiratory: negative for cough and sputum, Cardiovascular: negative for chest pain and Gastrointestinal: positive for abdominal pain  Assessment & Plan: Hypertension, essential -Labetalol or hydralazine as needed  Diverticulitis of large intestine with abscess -No previous history of diverticulitis.  CT shows sigmoid diverticulitis with abscess, no perforation -Continue Rocephin and Flagyl -Surgery also following, appreciate assistance -Okay for clear liquid diet at this time per surgery -Will likely need repeat CT prior to d/c  -Continue IV fluids - overall improving; diet advanced further; likely will repeat CT abd/pelvis in am   Hypercholesteremia Hold any statin for now   Antimicrobials: Rocephin 03/25/20 >> present Flagyl 03/25/20>> present  DVT prophylaxis: Lovenox Code Status: Full Family Communication: none Disposition Plan:  . Patient came from: Home        . Barriers to d/c OR conditions which need to be met to effect a safe d/c: none . The current disposition  plan is discharge to: home tomorrow   Objective: Vitals:   03/27/20 0807 03/27/20 1329 03/27/20 1456 03/27/20 1534  BP: (!) 159/85 (!) 189/94 (!) 199/95 (!) 163/82  Pulse: 75 80    Resp:  18    Temp: 98.4 F (36.9 C) 97.8 F (36.6 C)    TempSrc: Oral Oral    SpO2: 95% 98%    Weight:      Height:        Intake/Output Summary (Last 24 hours) at 03/27/2020 1626 Last data filed at 03/27/2020 1333 Gross per 24 hour  Intake 3095 ml  Output --  Net 3095 ml   Filed Weights   03/25/20 1239 03/25/20 1832  Weight: 66.2 kg 66.9 kg    Examination: General appearance: alert, cooperative and no distress Head: Normocephalic, without obvious abnormality Eyes: EOMI Lungs: clear to auscultation bilaterally Heart: regular rate and rhythm and S1, S2 normal Abdomen: Minimal tenderness to palpation in lower quadrants, significantly improved from previous exam.  Bowel sounds present, abdomen is soft Extremities: no edema Skin: mobility and turgor normal Neurologic: Grossly normal   Consultants:   Surgery  Procedures:   none  Data Reviewed: I have personally reviewed following labs and imaging studies Results for orders placed or performed during the hospital encounter of 03/25/20 (from the past 24 hour(s))  Basic metabolic panel     Status: None   Collection Time: 03/27/20  5:53 AM  Result Value Ref Range   Sodium 140 135 - 145 mmol/L   Potassium 3.8 3.5 - 5.1 mmol/L   Chloride 105 98 - 111 mmol/L   CO2 25 22 - 32 mmol/L  Glucose, Bld 79 70 - 99 mg/dL   BUN 8 8 - 23 mg/dL   Creatinine, Ser 0.85 0.44 - 1.00 mg/dL   Calcium 9.3 8.9 - 10.3 mg/dL   GFR calc non Af Amer >60 >60 mL/min   GFR calc Af Amer >60 >60 mL/min   Anion gap 10 5 - 15  CBC with Differential/Platelet     Status: Abnormal   Collection Time: 03/27/20  5:53 AM  Result Value Ref Range   WBC 5.8 4.0 - 10.5 K/uL   RBC 4.00 3.87 - 5.11 MIL/uL   Hemoglobin 13.1 12.0 - 15.0 g/dL   HCT 40.8 36 - 46 %   MCV 102.0 (H)  80.0 - 100.0 fL   MCH 32.8 26.0 - 34.0 pg   MCHC 32.1 30.0 - 36.0 g/dL   RDW 13.2 11.5 - 15.5 %   Platelets 201 150 - 400 K/uL   nRBC 0.0 0.0 - 0.2 %   Neutrophils Relative % 71 %   Neutro Abs 4.1 1.7 - 7.7 K/uL   Lymphocytes Relative 17 %   Lymphs Abs 1.0 0.7 - 4.0 K/uL   Monocytes Relative 10 %   Monocytes Absolute 0.6 0 - 1 K/uL   Eosinophils Relative 1 %   Eosinophils Absolute 0.0 0 - 0 K/uL   Basophils Relative 1 %   Basophils Absolute 0.0 0 - 0 K/uL   Immature Granulocytes 0 %   Abs Immature Granulocytes 0.01 0.00 - 0.07 K/uL  Magnesium     Status: None   Collection Time: 03/27/20  5:53 AM  Result Value Ref Range   Magnesium 1.9 1.7 - 2.4 mg/dL    Recent Results (from the past 240 hour(s))  SARS Coronavirus 2 by RT PCR (hospital order, performed in [facility] hospital lab) Nasopharyngeal Nasopharyngeal Swab     Status: None   Collection Time: 03/25/20  4:42 PM   Specimen: Nasopharyngeal Swab  Result Value Ref Range Status   SARS Coronavirus 2 NEGATIVE NEGATIVE Final    Comment: (NOTE) SARS-CoV-2 target nucleic acids are NOT DETECTED.  The SARS-CoV-2 RNA is generally detectable in upper and lower respiratory specimens during the acute phase of infection. The lowest concentration of SARS-CoV-2 viral copies this assay can detect is 250 copies / mL. A negative result does not preclude SARS-CoV-2 infection and should not be used as the sole basis for treatment or other patient management decisions.  A negative result may occur with improper specimen collection / handling, submission of specimen other than nasopharyngeal swab, presence of viral mutation(s) within the areas targeted by this assay, and inadequate number of viral copies (<250 copies / mL). A negative result must be combined with clinical observations, patient history, and epidemiological information.  Fact Sheet for Patients:   StrictlyIdeas.no  Fact Sheet for Healthcare  Providers: BankingDealers.co.za  This test is not yet approved or  cleared by the [place] FDA and has been authorized for detection and/or diagnosis of SARS-CoV-2 by FDA under an Emergency Use Authorization (EUA).  This EUA will remain in effect (meaning this test can be used) for the duration of the COVID-19 declaration under Section 564(b)(1) of the Act, 21 U.S.C. section 360bbb-3(b)(1), unless the authorization is terminated or revoked sooner.  Performed at [facility], [address] [place], [place] [address]      Radiology Studies: No results found. CT ABDOMEN PELVIS W CONTRAST  Final Result       Scheduled Meds: .  enoxaparin (LOVENOX) injection  40 mg Subcutaneous Q24H  . hydrALAZINE  25 mg Oral Q8H  . lisinopril  20 mg Oral Once  . [START ON 03/28/2020] lisinopril  40 mg Oral Daily   PRN Meds: acetaminophen, HYDROmorphone (DILAUDID) injection, hydrOXYzine, labetalol, ondansetron **OR** ondansetron (ZOFRAN) IV, oxyCODONE Continuous Infusions: . cefTRIAXone (ROCEPHIN)  IV 2 g (03/27/20 1510)  . lactated ringers 100 mL/hr at 03/27/20 1134  . metronidazole 500 mg (03/27/20 1616)      LOS: 2 days  Time spent: Greater than 50% of the 35 minute visit was spent in counseling/coordination of care for the patient as laid out in the A&P.   Dwyane Dee, MD [facility] 03/27/2020, 4:26 PM   Contact via secure chat.  To contact the attending provider between 7A-7P or the covering provider during after hours 7P-7A, please log into the web site [email or web address] and access using universal [place] password for that web site. If you do not have the password, please call the hospital operator.

## 2020-03-27 NOTE — Progress Notes (Signed)
[facility] Progress Note     Subjective: Patient feeling much improved, hoping to get home soon. Pain is improving. Denies nausea. Tolerating CLD and had a BM. Last colonoscopy was by Dr. Noe Gens at [facility] about 7 years ago. We discussed repeat colonoscopy in 6-8 weeks. This is her 1st bout of diverticulitis.   Objective: Vital signs in last 24 hours: Temp:  [97.8 F (36.6 C)-98.4 F (36.9 C)] 98.4 F (36.9 C) (07/08 0807) Pulse Rate:  [55-91] 75 (07/08 0807) Resp:  [9-26] 20 (07/08 0600) BP: (110-192)/(64-91) 159/85 (07/08 0807) SpO2:  [93 %-100 %] 95 % (07/08 0807) Last BM Date: 03/27/20  Intake/Output from previous day: 07/07 0701 - 07/08 0700 In: 3947.9 [P.O.:720; I.V.:2627.9; IV Piggyback:600] Out: -  Intake/Output this shift: No intake/output data recorded.  PE: General: pleasant, WD, WN female who is laying in bed in NAD HEENT:  Sclera are noninjected.  PERRL.  Ears and nose without any masses or lesions.  Mouth is pink and moist Heart: regular, rate, and rhythm. Palpable radial and pedal pulses bilaterally Lungs: CTAB, no wheezes, rhonchi, or rales noted.  Respiratory effort nonlabored Abd: soft, minimally ttp in LLQ, no peritonitis, ND, +BS, no masses, hernias, or organomegaly MS: all 4 extremities are symmetrical with no cyanosis, clubbing, or edema. Skin: warm and dry with no masses, lesions, or rashes Neuro: Cranial nerves 2-12 grossly intact, sensation grossly intact throughout Psych: A&Ox3 with an appropriate affect.   Lab Results:  Recent Labs    03/26/20 0500 03/27/20 0553  WBC 6.0 5.8  HGB 12.7 13.1  HCT 39.6 40.8  PLT 171 201   BMET Recent Labs    03/26/20 0500 03/27/20 0553  NA 137 140  K 3.8 3.8  CL 107 105  CO2 21* 25  GLUCOSE 87 79  BUN 10 8  CREATININE 0.79 0.85  CALCIUM 9.0 9.3   PT/INR No results for input(s): LABPROT, INR in the last 72 hours. CMP     Component Value Date/Time   NA 140 03/27/2020  0553   K 3.8 03/27/2020 0553   CL 105 03/27/2020 0553   CO2 25 03/27/2020 0553   GLUCOSE 79 03/27/2020 0553   BUN 8 03/27/2020 0553   CREATININE 0.85 03/27/2020 0553   CALCIUM 9.3 03/27/2020 0553   PROT 7.1 03/25/2020 1420   ALBUMIN 4.2 03/25/2020 1420   AST 17 03/25/2020 1420   ALT 13 03/25/2020 1420   ALKPHOS 80 03/25/2020 1420   BILITOT 0.8 03/25/2020 1420   GFRNONAA >60 03/27/2020 0553   GFRAA >60 03/27/2020 0553   Lipase     Component Value Date/Time   LIPASE 18 03/25/2020 1420       Studies/Results: CT ABDOMEN PELVIS W CONTRAST  Result Date: 03/25/2020 CLINICAL DATA:  Abdominal pain.  LEFT-sided abdominal pain EXAM: CT ABDOMEN AND PELVIS WITH CONTRAST TECHNIQUE: Multidetector CT imaging of the abdomen and pelvis was performed using the standard protocol following bolus administration of intravenous contrast. CONTRAST:  OMNIPAQUE IOHEXOL 300 MG/ML  SOLN COMPARISON:  None. FINDINGS: Lower chest: Lung bases are clear. Hepatobiliary: Multiple small hypodense lesions in liver likely represent small cysts. Gallbladder normal. Pancreas: Pancreas is normal. No ductal dilatation. No pancreatic inflammation. Spleen: Normal spleen Adrenals/urinary tract: Adrenal glands and kidneys are normal. The ureters and bladder normal. Stomach/Bowel: Stomach, duodenum small-bowel. Cecum and appendix are normal. Ascending transverse colon normal. Descending colon normal. There is inflammatory process along the anti mesenteric wall of the proximal sigmoid  colon. This inflammation occurs over approximately 5 cm segment (image 65/2). There appears to be small abscess within the wall of the sigmoid colon with a rim enhancing low-density collection measuring 1.7 cm image 65/2. This is through a region of extensive diverticulosis. NO frank perforation or extraluminal abscess. Vascular/Lymphatic: Abdominal aorta is normal caliber with atherosclerotic calcification. There is no retroperitoneal or periportal  lymphadenopathy. No pelvic lymphadenopathy. Reproductive: Post hysterectomy no adnexal abnormality Other: No free fluid. Musculoskeletal: No aggressive osseous lesion. IMPRESSION: 1. Acute diverticulitis of the proximal sigmoid with intramural abscess. NO frank perforation or extraluminal abscess identified. 2. As there is asymmetric thickening through the sigmoid colon recommend follow-up imaging to exclude an unlikely underlying neoplasm. Electronically Signed   By: Genevive Bi M.D.   On: 03/25/2020 15:46    Anti-infectives: Anti-infectives (From admission, onward)   Start     Dose/Rate Route Frequency Ordered Stop   03/26/20 1600  cefTRIAXone (ROCEPHIN) 2 g in sodium chloride 0.9 % 100 mL IVPB     Discontinue     2 g 200 mL/hr over 30 Minutes Intravenous Every 24 hours 03/25/20 2011     03/26/20 0000  metroNIDAZOLE (FLAGYL) IVPB 500 mg     Discontinue     500 mg 100 mL/hr over 60 Minutes Intravenous Every 8 hours 03/25/20 2011     03/25/20 1630  cefTRIAXone (ROCEPHIN) 2 g in sodium chloride 0.9 % 100 mL IVPB       "And" Linked Group Details   2 g 200 mL/hr over 30 Minutes Intravenous  Once 03/25/20 1616 03/25/20 2111   03/25/20 1630  metroNIDAZOLE (FLAGYL) IVPB 500 mg       "And" Linked Group Details   500 mg 100 mL/hr over 60 Minutes Intravenous  Once 03/25/20 1616 03/25/20 2112       Assessment/Plan HTN HLD  Sigmoid diverticulitis with intramural abscess - CT 7/6 consistent with above dx - WBC 5.8, afebrile - advance to FLD, ok to advance to soft for dinner if tolerating well - repeat AM labs tomorrow - likely home tomorrow if continuing to do well - will need a total of 14 days abx, will need colonoscopy in 6-8 weeks  - nutrition consult for education on low fiber diet for 4-6 weeks followed by high fiber diet - no indication for acute surgical intervention at this time  FEN: CLD, IVF VTE: lovenox ID: [email or web address] 7/6>>  LOS: 2 days    [name] ,  [facility] Surgery 03/27/2020, 9:52 AM Please see Amion for pager number during day hours 7:00am-4:30pm

## 2020-03-28 ENCOUNTER — Encounter (HOSPITAL_COMMUNITY): Payer: Self-pay | Admitting: Internal Medicine

## 2020-03-28 ENCOUNTER — Inpatient Hospital Stay (HOSPITAL_COMMUNITY): Payer: Medicare Other

## 2020-03-28 DIAGNOSIS — R030 Elevated blood-pressure reading, without diagnosis of hypertension: Secondary | ICD-10-CM

## 2020-03-28 LAB — BASIC METABOLIC PANEL
Anion gap: 11 (ref 5–15)
BUN: 7 mg/dL — ABNORMAL LOW (ref 8–23)
CO2: 25 mmol/L (ref 22–32)
Calcium: 9.1 mg/dL (ref 8.9–10.3)
Chloride: 103 mmol/L (ref 98–111)
Creatinine, Ser: 0.86 mg/dL (ref 0.44–1.00)
GFR calc Af Amer: >60 mL/min (ref >60)
GFR calc non Af Amer: >60 mL/min (ref >60)
Glucose, Bld: 90 mg/dL (ref 70–99)
Potassium: 4.4 mmol/L (ref 3.5–5.1)
Sodium: 139 mmol/L (ref 135–145)

## 2020-03-28 LAB — CBC WITH DIFFERENTIAL/PLATELET
Abs Immature Granulocytes: 0.03 10*3/uL (ref 0.00–0.07)
Basophils Absolute: 0 10*3/uL (ref 0.0–0.1)
Basophils Relative: 0 %
Eosinophils Absolute: 0 10*3/uL (ref 0.0–0.5)
Eosinophils Relative: 1 %
HCT: 39.4 % (ref 36.0–46.0)
Hemoglobin: 13.1 g/dL (ref 12.0–15.0)
Immature Granulocytes: 1 %
Lymphocytes Relative: 17 %
Lymphs Abs: 1 10*3/uL (ref 0.7–4.0)
MCH: 33.6 pg (ref 26.0–34.0)
MCHC: 33.2 g/dL (ref 30.0–36.0)
MCV: 101 fL — ABNORMAL HIGH (ref 80.0–100.0)
Monocytes Absolute: 0.8 10*3/uL (ref 0.1–1.0)
Monocytes Relative: 13 %
Neutro Abs: 4.2 10*3/uL (ref 1.7–7.7)
Neutrophils Relative %: 68 %
Platelets: 190 10*3/uL (ref 150–400)
RBC: 3.9 MIL/uL (ref 3.87–5.11)
RDW: 13.1 % (ref 11.5–15.5)
WBC: 6.1 10*3/uL (ref 4.0–10.5)
nRBC: 0 % (ref 0.0–0.2)

## 2020-03-28 LAB — MAGNESIUM: Magnesium: 1.9 mg/dL (ref 1.7–2.4)

## 2020-03-28 MED ORDER — CIPROFLOXACIN HCL 500 MG PO TABS
500.0000 mg | ORAL_TABLET | Freq: Two times a day (BID) | ORAL | 0 refills | Status: AC
Start: 2020-03-28 — End: 2020-04-09

## 2020-03-28 MED ORDER — BOOST / RESOURCE BREEZE PO LIQD CUSTOM
1.0000 | Freq: Three times a day (TID) | ORAL | Status: DC
  Administered 2020-03-28: 1 via ORAL

## 2020-03-28 MED ORDER — SODIUM CHLORIDE (PF) 0.9 % IJ SOLN
INTRAMUSCULAR | Status: AC
  Filled 2020-03-28: qty 50

## 2020-03-28 MED ORDER — METRONIDAZOLE 500 MG PO TABS
500.0000 mg | ORAL_TABLET | Freq: Three times a day (TID) | ORAL | 0 refills | Status: AC
Start: 2020-03-28 — End: 2020-04-09

## 2020-03-28 MED ORDER — PRO-STAT SUGAR FREE PO LIQD
30.0000 mL | Freq: Two times a day (BID) | ORAL | Status: DC
  Administered 2020-03-28: 30 mL via ORAL
  Filled 2020-03-28: qty 30

## 2020-03-28 MED ORDER — IOHEXOL 9 MG/ML PO SOLN
ORAL | Status: AC
  Filled 2020-03-28: qty 1000

## 2020-03-28 MED ORDER — LISINOPRIL 40 MG PO TABS: 40.0000 mg | ORAL_TABLET | Freq: Every day | ORAL | 3 refills | Status: DC

## 2020-03-28 MED ORDER — IOHEXOL 300 MG/ML  SOLN
100.0000 mL | Freq: Once | INTRAMUSCULAR | Status: AC | PRN
  Administered 2020-03-28: 100 mL via INTRAVENOUS

## 2020-03-28 MED ORDER — IOHEXOL 9 MG/ML PO SOLN
500.0000 mL | ORAL | Status: AC
  Administered 2020-03-28 (×2): 500 mL via ORAL

## 2020-03-28 MED ORDER — PROSOURCE PLUS PO LIQD
30.0000 mL | Freq: Two times a day (BID) | ORAL | Status: DC
  Filled 2020-03-28: qty 30

## 2020-03-28 NOTE — Care Management Important Message (Signed)
Important Message  Patient Details IM Letter presented to the Patient Name: Morgan Sparks MRN: [id number] Date of Birth: 10-Dec-1950   Medicare Important Message Given:  Yes     Caren Macadam 03/28/2020, 10:58 AM

## 2020-03-28 NOTE — Progress Notes (Signed)
Still waiting for CT scan to be done, called the department and was informed they are on their way to pick-up patient, patient notified.

## 2020-03-28 NOTE — Progress Notes (Signed)
[facility] Progress Note     Subjective: Patient tol soft diet, having bowel function. Pain is minimal and well controlled. Getting repeat CT this AM and hopefully home later. Discussed importance of colonoscopy in a few weeks.   Objective: Vital signs in last 24 hours: Temp:  [97.8 F (36.6 C)-99.1 F (37.3 C)] 98.9 F (37.2 C) (07/09 0534) Pulse Rate:  [80-91] 81 (07/09 0534) Resp:  [16-19] 19 (07/09 0534) BP: (144-199)/(71-95) 151/85 (07/09 0849) SpO2:  [98 %-100 %] 100 % (07/09 0534) Last BM Date: 03/28/20  Intake/Output from previous day: 07/08 0701 - 07/09 0700 In: 1840 [P.O.:240; I.V.:1400; IV Piggyback:200] Out: -  Intake/Output this shift: No intake/output data recorded.  PE: General: pleasant, WD, WNfemale who is laying in bed in NAD HEENT: Sclera are noninjected. PERRL. Ears and nose without any masses or lesions. Mouth is pink and moist Heart: regular, rate, and rhythm. Palpable radial and pedal pulses bilaterally Lungs: CTAB, no wheezes, rhonchi, or rales noted. Respiratory effort nonlabored Abd: soft,minimally ttp in LLQ, no peritonitis, ND, +BS, no masses, hernias, or organomegaly MS: all 4 extremities are symmetrical with no cyanosis, clubbing, or edema. Skin: warm and dry with no masses, lesions, or rashes Neuro: Cranial nerves 2-12 grossly intact, sensation grossly intact throughout Psych: A&Ox3 with an appropriate affect.   Lab Results:  Recent Labs    03/27/20 0553 03/28/20 0516  WBC 5.8 6.1  HGB 13.1 13.1  HCT 40.8 39.4  PLT 201 190   BMET Recent Labs    03/27/20 0553 03/28/20 0516  NA 140 139  K 3.8 4.4  CL 105 103  CO2 25 25  GLUCOSE 79 90  BUN 8 7*  CREATININE 0.85 0.86  CALCIUM 9.3 9.1   PT/INR No results for input(s): LABPROT, INR in the last 72 hours. CMP     Component Value Date/Time   NA 139 03/28/2020 0516   K 4.4 03/28/2020 0516   CL 103 03/28/2020 0516   CO2 25 03/28/2020 0516   GLUCOSE 90  03/28/2020 0516   BUN 7 (L) 03/28/2020 0516   CREATININE 0.86 03/28/2020 0516   CALCIUM 9.1 03/28/2020 0516   PROT 7.1 03/25/2020 1420   ALBUMIN 4.2 03/25/2020 1420   AST 17 03/25/2020 1420   ALT 13 03/25/2020 1420   ALKPHOS 80 03/25/2020 1420   BILITOT 0.8 03/25/2020 1420   GFRNONAA >60 03/28/2020 0516   GFRAA >60 03/28/2020 0516   Lipase     Component Value Date/Time   LIPASE 18 03/25/2020 1420       Studies/Results: No results found.  Anti-infectives: Anti-infectives (From admission, onward)   Start     Dose/Rate Route Frequency Ordered Stop   03/26/20 1600  cefTRIAXone (ROCEPHIN) 2 g in sodium chloride 0.9 % 100 mL IVPB     Discontinue     2 g 200 mL/hr over 30 Minutes Intravenous Every 24 hours 03/25/20 2011     03/26/20 0000  metroNIDAZOLE (FLAGYL) IVPB 500 mg     Discontinue     500 mg 100 mL/hr over 60 Minutes Intravenous Every 8 hours 03/25/20 2011     03/25/20 1630  cefTRIAXone (ROCEPHIN) 2 g in sodium chloride 0.9 % 100 mL IVPB       "And" Linked Group Details   2 g 200 mL/hr over 30 Minutes Intravenous  Once 03/25/20 1616 03/25/20 2111   03/25/20 1630  metroNIDAZOLE (FLAGYL) IVPB 500 mg       "And" Linked  Group Details   500 mg 100 mL/hr over 60 Minutes Intravenous  Once 03/25/20 1616 03/25/20 2112       Assessment/Plan HTN HLD  Sigmoid diverticulitis with intramural abscess - CT 7/6 consistent with above dx - WBC 56.1, afebrile - primary ordered repeat CT prior to dc - this is pending  - ok to discharge from a surgical perspective - will need a total of 14 days abx, will need colonoscopy in 6-8 weeks  - nutrition consult for education on low fiber diet for 4-6 weeks followed by high fiber diet - no indication for acute surgical intervention at this time  FEN: soft diet  VTE: lovenox ID: [email or web address] 7/6>>  LOS: 3 days    [name] , [facility] Surgery 03/28/2020, 10:50 AM Please see Amion for pager number during day  hours 7:00am-4:30pm

## 2020-03-28 NOTE — Progress Notes (Signed)
Patient back from CT scan asking for discharge. Notified Dr. Frederick Peers, MD stated he is waiting for the result. Patient informed that MD is waiting to get the CT scan result back before discharge, patient stated it's taking too long, she is ready to go. MD informed that patient is getting mad, MD on the way to talk to patient.

## 2020-03-28 NOTE — Discharge Summary (Signed)
Physician Discharge Summary  Melessa Cowell EXH:[id number] DOB: July 26, 1951 DOA: 03/25/2020  PCP: Karle Plumber, MD  Admit date: 03/25/2020 Discharge date: 03/28/2020  Admitted From: home Disposition:  home Discharging physician: Lewie Chamber, MD  Recommendations for Outpatient Follow-up:  1. Refer to GI for scheduling CLN 2. Modify lisinopril as BP tolerates   Patient discharged to Home in Discharge Condition: stable CODE STATUS: Full Diet recommendation:  Diet Orders (From admission, onward)    Start     Ordered   03/28/20 0000  Diet general        03/28/20 1545   03/27/20 1400  DIET SOFT Room service appropriate? Yes; Fluid consistency: Thin  Diet effective 1400       Question Answer Comment  Room service appropriate? Yes   Fluid consistency: Thin      03/27/20 0945          Hospital Course: Ms. Schadt is a 69 yo female with PMH diverticulosis, HLD who presented with abdominal pain.  She underwent CT abdomen/pelvis which revealed acute sigmoid diverticulitis with intramural abscess (1.7 cm) and no signs of perforation.  She was started on Rocephin and Flagyl and admitted to the hospital.  Surgery was also consulted. She did have good clinical improvement with bowel rest initially followed by slow advancement of diet. Antibiotics and IVF were continued.   Her blood pressure remained elevated during hospitalization however she carried no prior diagnosis of HTN nor was she on medication at home. She was treated with lisinopril and discharged with a prescription in case of ongoing elevated BP at home. She was instructed to check her BP consistently and if it remained elevated to then continue the medication and discuss further with her PCP, and if her BP did become low then to discontinue the medication.   A repeat CT abd/pelvis was ordered on 03/28/20 for serial monitoring of her diverticulitis with abscess as is typical and appropriate management. Unfortunately, the patient became  agitated and wished to be discharged prior to results of CT scan being back. She was prescribed cipro and flagyl to complete a total of 14 day course. She will follow up with her PCP and obtain referral to GI for a CLN after her diverticulitis episode has resolved. She was discharged home in stable condition.    Elevated blood pressure reading -Treated with lisinopril during hospitalization.  No prior history of hypertension and patient states blood pressure readings are normal outpatient.  She denied pain playing a significant role in her blood pressure during the hospitalization -Prescription provided for lisinopril at discharge and patient instructed to monitor her blood pressure closely.  She will follow-up with PCP regarding blood pressure levels as well.  She knows to discontinue if blood pressure becomes low and to continue if her blood pressure tolerates.  Diverticulitis of large intestine with abscess -No previous history of diverticulitis.  CT shows sigmoid diverticulitis with abscess, no perforation -Continue Rocephin and Flagyl during inpatient -Surgery also following, appreciate assistance -tolerated diet advancement and pain continues to improve - overall improving; repeat CT abd/pelvis ordered for 03/28/20; patient became agitated and did not wish to remain for results; explained to patient prior to discharge that serial imaging is recommended for diverticulitis with abscess that has responded to abx; patient discharged with cipro/flagyl to complete 14 day course total - outpatient follow up with PCP for GI referral for CLN  Hypercholesteremia -Vytorin resumed at discharge    The patient's chronic medical conditions were treated accordingly per  the patient's home medication regimen except as noted.  On day of discharge, patient was felt deemed stable for discharge. Patient/family member advised to call PCP or come back to ER if needed.   Discharge Diagnoses:   Principal  Diagnosis: Diverticulitis of large intestine with abscess  Active Hospital Problems   Diagnosis Date Noted  . Diverticulitis of large intestine with abscess 03/25/2020    Priority: High  . Elevated blood pressure reading 03/25/2020  . Hypercholesteremia 03/25/2020    Resolved Hospital Problems  No resolved problems to display.    Discharge Instructions    Diet general   Complete by: As directed    Discharge instructions   Complete by: As directed    Check you blood pressure at home. If remains elevated, continue taking lisinopril. If it does become low (less than 90/60) then stop taking lisinopril and discuss further need for medication with your primary care provider.   Please obtain a referral to GI for a colonoscopy following recovery from diverticulitis.   Increase activity slowly   Complete by: As directed      Allergies as of 03/28/2020      Reactions   Doxycycline Hives   Strawberry (diagnostic) Hives      Medication List    TAKE these medications   albuterol 108 (90 Base) MCG/ACT inhaler Commonly known as: VENTOLIN HFA Inhale 2 puffs into the lungs daily as needed for wheezing or shortness of breath.   aspirin 81 MG tablet Take 81 mg by mouth See admin instructions. Takes twice a week   ciprofloxacin 500 MG tablet Commonly known as: Cipro Take 1 tablet (500 mg total) by mouth 2 (two) times daily for 12 days.   ezetimibe-simvastatin 10-20 MG tablet Commonly known as: VYTORIN Take 1 tablet by mouth daily.   lisinopril 40 MG tablet Commonly known as: ZESTRIL Take 1 tablet (40 mg total) by mouth daily. Start taking on: March 29, 2020   metroNIDAZOLE 500 MG tablet Commonly known as: Flagyl Take 1 tablet (500 mg total) by mouth 3 (three) times daily for 12 days. Notes to patient: 03/28/20   sodium chloride 0.65 % Soln nasal spray Commonly known as: OCEAN Place 1 spray into both nostrils as needed for congestion.       Follow-up Information    Surgery,  [facility]. Call.   Specialty: General Surgery Why: Call and schedule a follow up appointment with a colorectal surgeon (Dr. Cliffton Asters, Dr. Michaell Cowing, or Dr. Maisie Fus) after repeat colonoscopy.  Contact information: [address] [place] [address] [phone]        Karle Plumber, MD. Call.   Specialty: Internal Medicine Why: Call and schedule follow up to be seen in 1-2 weeks. You will need a repeat colonoscopy in 6-8 weeks.  Contact information: [address] [place] [address] [phone]              Allergies  Allergen Reactions  . Doxycycline Hives  . Strawberry (Diagnostic) Hives   Consultations: GI   Procedures/Studies: CT ABDOMEN PELVIS W CONTRAST  Result Date: 03/28/2020 CLINICAL DATA:  Diverticulitis follow-up. EXAM: CT ABDOMEN AND PELVIS WITH CONTRAST TECHNIQUE: Multidetector CT imaging of the abdomen and pelvis was performed using the standard protocol following bolus administration of intravenous contrast. CONTRAST:  OMNIPAQUE IOHEXOL 300 MG/ML  SOLN COMPARISON:  CT dated March 25, 2020 FINDINGS: Lower chest: There is a new trace right-sided pleural effusion.The heart size is normal. Hepatobiliary: There are  multiple small hypoechoic nodules in the liver that are too small to characterize but are statistically most likely to represent benign cysts. Status post cholecystectomy.There is no biliary ductal dilation. Pancreas: Normal contours without ductal dilatation. No peripancreatic fluid collection. Spleen: Unremarkable. Adrenals/Urinary Tract: --Adrenal glands: Unremarkable. --Right kidney/ureter: No hydronephrosis or radiopaque kidney stones. --Left kidney/ureter: No hydronephrosis or radiopaque kidney stones. --Urinary bladder: There is some mild asymmetric bladder wall thickening on the left, likely reactive. Stomach/Bowel: --Stomach/Duodenum: No hiatal hernia or other gastric abnormality. Normal duodenal course and caliber. --Small bowel:  Unremarkable. --Colon: Again noted are findings of sigmoid diverticulitis. There is a small intramural abscess as before which has significantly improved since the prior study currently measuring approximately 2 cm (axial series 2, image 62), previously measuring approximately 4.4 cm. Again, there is no adjacent abscess or free air. There is circumferential wall thickening at this level that is somewhat asymmetric. --Appendix: Normal. Vascular/Lymphatic: Atherosclerotic calcification is present within the non-aneurysmal abdominal aorta, without hemodynamically significant stenosis. --No retroperitoneal lymphadenopathy. --No mesenteric lymphadenopathy. --No pelvic or inguinal lymphadenopathy. Reproductive: Status post hysterectomy. No adnexal mass. Other: There is a small amount of reactive free fluid in the patient's pelvis. The abdominal wall is normal. Musculoskeletal. No acute displaced fractures. IMPRESSION: 1. Persistent but improving findings of sigmoid diverticulitis. There is a small intramural abscess as before which has significantly improved since the prior study currently measuring approximately 2 cm, previously measuring approximately 4.4 cm. Again, there is no adjacent abscess or free air. Again, as there is asymmetric wall thickening at this level, outpatient colonoscopy is recommended for further evaluation. 2. New trace right-sided pleural effusion. 3. Mild asymmetric bladder wall thickening on the left, likely reactive. 4. Trace pelvic free fluid, likely reactive. Aortic Atherosclerosis (ICD10-I70.0). Electronically Signed   By: Katherine Mantle M.D.   On: 03/28/2020 15:53   CT ABDOMEN PELVIS W CONTRAST  Result Date: 03/25/2020 CLINICAL DATA:  Abdominal pain.  LEFT-sided abdominal pain EXAM: CT ABDOMEN AND PELVIS WITH CONTRAST TECHNIQUE: Multidetector CT imaging of the abdomen and pelvis was performed using the standard protocol following bolus administration of intravenous contrast. CONTRAST:   OMNIPAQUE IOHEXOL 300 MG/ML  SOLN COMPARISON:  None. FINDINGS: Lower chest: Lung bases are clear. Hepatobiliary: Multiple small hypodense lesions in liver likely represent small cysts. Gallbladder normal. Pancreas: Pancreas is normal. No ductal dilatation. No pancreatic inflammation. Spleen: Normal spleen Adrenals/urinary tract: Adrenal glands and kidneys are normal. The ureters and bladder normal. Stomach/Bowel: Stomach, duodenum small-bowel. Cecum and appendix are normal. Ascending transverse colon normal. Descending colon normal. There is inflammatory process along the anti mesenteric wall of the proximal sigmoid colon. This inflammation occurs over approximately 5 cm segment (image 65/2). There appears to be small abscess within the wall of the sigmoid colon with a rim enhancing low-density collection measuring 1.7 cm image 65/2. This is through a region of extensive diverticulosis. NO frank perforation or extraluminal abscess. Vascular/Lymphatic: Abdominal aorta is normal caliber with atherosclerotic calcification. There is no retroperitoneal or periportal lymphadenopathy. No pelvic lymphadenopathy. Reproductive: Post hysterectomy no adnexal abnormality Other: No free fluid. Musculoskeletal: No aggressive osseous lesion. IMPRESSION: 1. Acute diverticulitis of the proximal sigmoid with intramural abscess. NO frank perforation or extraluminal abscess identified. 2. As there is asymmetric thickening through the sigmoid colon recommend follow-up imaging to exclude an unlikely underlying neoplasm. Electronically Signed   By: Genevive Bi M.D.   On: 03/25/2020 15:46    Discharge Exam: BP (!) 181/94 (BP Location: Right Arm)  Pulse 83   Temp 97.8 F (36.6 C) (Oral)   Resp 20   Ht 5\' 5"  (1.651 m)   Wt 66.9 kg   SpO2 100%   BMI 24.54 kg/m  General appearance: alert, cooperative and no distress Head: Normocephalic, without obvious abnormality Eyes: EOMI Lungs: clear to auscultation  bilaterally Heart: regular rate and rhythm and S1, S2 normal Abdomen: Minimal tenderness to palpation in lower quadrants, significantly improved from previous exam.  Bowel sounds present, abdomen is soft Extremities: no edema Skin: mobility and turgor normal Neurologic: Grossly normal  The results of significant diagnostics from this hospitalization (including imaging, microbiology, ancillary and laboratory) are listed below for reference.     Microbiology: Recent Results (from the past 240 hour(s))  SARS Coronavirus 2 by RT PCR (hospital order, performed in [facility] lab) Nasopharyngeal Nasopharyngeal Swab     Status: None   Collection Time: 03/25/20  4:42 PM   Specimen: Nasopharyngeal Swab  Result Value Ref Range Status   SARS Coronavirus 2 NEGATIVE NEGATIVE Final    Comment: (NOTE) SARS-CoV-2 target nucleic acids are NOT DETECTED.  The SARS-CoV-2 RNA is generally detectable in upper and lower respiratory specimens during the acute phase of infection. The lowest concentration of SARS-CoV-2 viral copies this assay can detect is 250 copies / mL. A negative result does not preclude SARS-CoV-2 infection and should not be used as the sole basis for treatment or other patient management decisions.  A negative result may occur with improper specimen collection / handling, submission of specimen other than nasopharyngeal swab, presence of viral mutation(s) within the areas targeted by this assay, and inadequate number of viral copies (<250 copies / mL). A negative result must be combined with clinical observations, patient history, and epidemiological information.  Fact Sheet for Patients:   BoilerBrush.com.cyhttps://www.fda.gov/media/136312/download  Fact Sheet for Healthcare Providers: https://pope.com/https://www.fda.gov/media/136313/download  This test is not yet approved or  cleared by the [place] FDA and has been authorized for detection and/or diagnosis of SARS-CoV-2 by FDA under an Emergency  Use Authorization (EUA).  This EUA will remain in effect (meaning this test can be used) for the duration of the COVID-19 declaration under Section 564(b)(1) of the Act, 21 U.S.C. section 360bbb-3(b)(1), unless the authorization is terminated or revoked sooner.  Performed at [facility] [place], [address] [place], [place] [id number]      Labs: BNP (last 3 results) No results for input(s): BNP in the last 8760 hours. Basic Metabolic Panel: Recent Labs  Lab 03/25/20 1420 03/26/20 0500 03/27/20 0553 03/28/20 0516  NA 134* 137 140 139  K 4.1 3.8 3.8 4.4  CL 103 107 105 103  CO2 19* 21* 25 25  GLUCOSE 91 87 79 90  BUN 10 10 8  7*  CREATININE 0.86 0.79 0.85 0.86  CALCIUM 9.4 9.0 9.3 9.1  MG  --   --  1.9 1.9   Liver Function Tests: Recent Labs  Lab 03/25/20 1420  AST 17  ALT 13  ALKPHOS 80  BILITOT 0.8  PROT 7.1  ALBUMIN 4.2   Recent Labs  Lab 03/25/20 1420  LIPASE 18   No results for input(s): AMMONIA in the last 168 hours. CBC: Recent Labs  Lab 03/25/20 1349 03/26/20 0500 03/27/20 0553 03/28/20 0516  WBC 8.4 6.0 5.8 6.1  NEUTROABS  --   --  4.1 4.2  HGB 15.2* 12.7 13.1 13.1  HCT 46.4* 39.6 40.8 39.4  MCV 101.8* 103.4* 102.0* 101.0*  PLT  212 171 201 190   Cardiac Enzymes: No results for input(s): CKTOTAL, CKMB, CKMBINDEX, TROPONINI in the last 168 hours. BNP: Invalid input(s): POCBNP CBG: No results for input(s): GLUCAP in the last 168 hours. D-Dimer No results for input(s): DDIMER in the last 72 hours. Hgb A1c No results for input(s): HGBA1C in the last 72 hours. Lipid Profile No results for input(s): CHOL, HDL, LDLCALC, TRIG, CHOLHDL, LDLDIRECT in the last 72 hours. Thyroid function studies No results for input(s): TSH, T4TOTAL, T3FREE, THYROIDAB in the last 72 hours.  Invalid input(s): FREET3 Anemia work up No results for input(s): VITAMINB12, FOLATE, FERRITIN, TIBC, IRON, RETICCTPCT in the last 72 hours. Urinalysis    Component  Value Date/Time   COLORURINE ORANGE (A) 03/25/2020 1332   APPEARANCEUR CLEAR 03/25/2020 1332   LABSPEC <1.005 (L) 03/25/2020 1332   PHURINE 5.0 03/25/2020 1332   GLUCOSEU 250 (A) 03/25/2020 1332   HGBUR NEGATIVE 03/25/2020 1332   BILIRUBINUR SMALL (A) 03/25/2020 1332   KETONESUR NEGATIVE 03/25/2020 1332   PROTEINUR 30 (A) 03/25/2020 1332   NITRITE POSITIVE (A) 03/25/2020 1332   LEUKOCYTESUR NEGATIVE 03/25/2020 1332   Sepsis Labs Invalid input(s): PROCALCITONIN,  WBC,  LACTICIDVEN Microbiology Recent Results (from the past 240 hour(s))  SARS Coronavirus 2 by RT PCR (hospital order, performed in [facility] hospital lab) Nasopharyngeal Nasopharyngeal Swab     Status: None   Collection Time: 03/25/20  4:42 PM   Specimen: Nasopharyngeal Swab  Result Value Ref Range Status   SARS Coronavirus 2 NEGATIVE NEGATIVE Final    Comment: (NOTE) SARS-CoV-2 target nucleic acids are NOT DETECTED.  The SARS-CoV-2 RNA is generally detectable in upper and lower respiratory specimens during the acute phase of infection. The lowest concentration of SARS-CoV-2 viral copies this assay can detect is 250 copies / mL. A negative result does not preclude SARS-CoV-2 infection and should not be used as the sole basis for treatment or other patient management decisions.  A negative result may occur with improper specimen collection / handling, submission of specimen other than nasopharyngeal swab, presence of viral mutation(s) within the areas targeted by this assay, and inadequate number of viral copies (<250 copies / mL). A negative result must be combined with clinical observations, patient history, and epidemiological information.  Fact Sheet for Patients:   BoilerBrush.com.cy  Fact Sheet for Healthcare Providers: https://pope.com/  This test is not yet approved or  cleared by the [place] FDA and has been authorized for detection and/or diagnosis  of SARS-CoV-2 by FDA under an Emergency Use Authorization (EUA).  This EUA will remain in effect (meaning this test can be used) for the duration of the COVID-19 declaration under Section 564(b)(1) of the Act, 21 U.S.C. section 360bbb-3(b)(1), unless the authorization is terminated or revoked sooner.  Performed at [facility], [address] [place], [place] [address]      Time coordinating discharge: Over 30 minutes    Lewie Chamber, MD  [facility] 03/28/2020, 4:08 PM Pager: Secure chat  If 7PM-7AM, please contact night-coverage [email or web address] Password [email or web address]

## 2020-03-28 NOTE — Progress Notes (Signed)
Patient discharged home, discharge instructions given and explained to patient, she verbalized understanding, denies any pain/distress, skin intact, no wound noted. accompanied home by family.

## 2020-03-28 NOTE — Progress Notes (Signed)
Initial Nutrition Assessment  DOCUMENTATION CODES:   Not applicable  INTERVENTION:  Provided education on low and high fiber diets  Boost Breeze po TID, each supplement provides 250 kcal and 9 grams of protein  51ml Prosource po BID, each supplement provides 100 kcal and 15 grams of protein  NUTRITION DIAGNOSIS:   Inadequate oral intake related to poor appetite as evidenced by per patient/family report.    GOAL:   Patient will meet greater than or equal to 90% of their needs    MONITOR:   PO intake, Supplement acceptance, Diet advancement, Labs, Weight trends, I & O's  REASON FOR ASSESSMENT:   Consult Diet education  ASSESSMENT:   Pt admitted with diverticulitis of large intestine with abscess. PMH includes diverticulosis, HLD.   Pt reports poor appetite. PTA, pt was eating 1-2 balanced meals per day with snacks between meals. Pt reports this has been typical throughout her life.   Provided pt with education on low fiber diet and a high fiber diet; also discussed transition from low to high fiber.   Limited wt hx available for review. Pt denies unintentional wt loss over the last year.   PO Intake: 30-50% x 2 recorded meals  Labs and medications reviewed.   NUTRITION - FOCUSED PHYSICAL EXAM:    Most Recent Value  Orbital Region No depletion  Upper Arm Region No depletion  Thoracic and Lumbar Region No depletion  Buccal Region No depletion  Temple Region No depletion  Clavicle Bone Region No depletion  Clavicle and Acromion Bone Region No depletion  Scapular Bone Region No depletion  Dorsal Hand No depletion  Patellar Region No depletion  Anterior Thigh Region No depletion  Posterior Calf Region No depletion  Edema (RD Assessment) None  Hair Reviewed  Eyes Reviewed  Mouth Reviewed  Skin Reviewed  Nails Reviewed       Diet Order:   Diet Order            DIET SOFT Room service appropriate? Yes; Fluid consistency: Thin  Diet effective 1400                  EDUCATION NEEDS:   Education needs have been addressed  Skin:  Skin Assessment: Reviewed RN Assessment  Last BM:  7/9  Height:   Ht Readings from Last 1 Encounters:  03/25/20 5\' 5"  (1.651 m)    Weight:   Wt Readings from Last 1 Encounters:  03/25/20 66.9 kg    BMI:  Body mass index is 24.54 kg/m.  Estimated Nutritional Needs:   Kcal:  1600-1800  Protein:  80-90 grams  Fluid:  >1.6L/d    05/26/20, MS, RD, LDN RD pager number and weekend/on-call pager number located in Amion.

## 2020-03-28 NOTE — Progress Notes (Signed)
Notified CT department that patient is done with her contrast, they stated they will send for her after they are done with the other patients they are working on. Patient informed.

## 2021-03-23 IMAGING — CT CT ABD-PELV W/ CM
2 of 5 series · 16 of 46 positions shown, 18 images · IV contrast (Omnipaque)
Comparison: None.

CLINICAL DATA: Abdominal pain.  LEFT-sided abdominal pain

EXAM:
CT ABDOMEN AND PELVIS WITH CONTRAST
TECHNIQUE: Multidetector CT imaging of the abdomen and pelvis was performed
using the standard protocol following bolus administration of
intravenous contrast.
CONTRAST:  100mL OMNIPAQUE IOHEXOL 300 MG/ML  SOLN

[Series 2: axial st · axial · 0.84mm/px · z∈[-512,-122]mm · 13 of 88 slices shown, 15 images]
[im 5/88  soft-tissue]
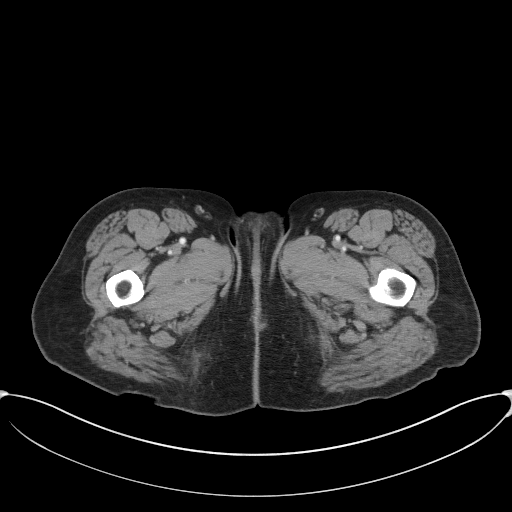
[im 5/88  bone]
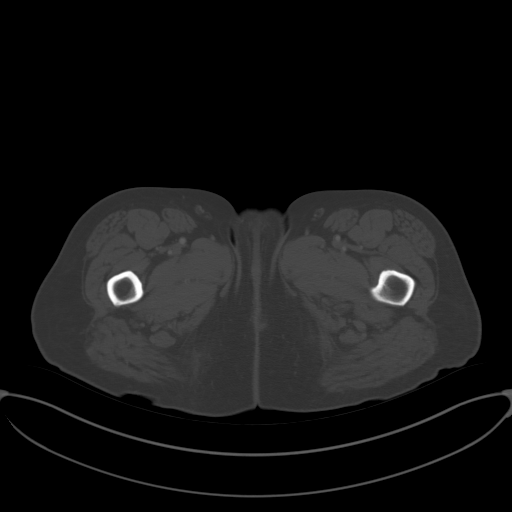
[im 10/88  soft-tissue]
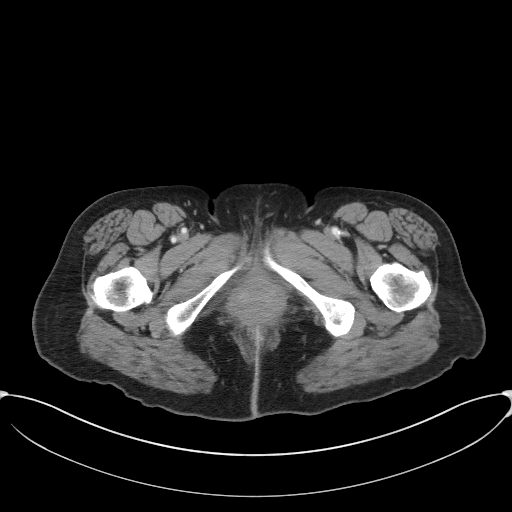
[im 20/88  soft-tissue]
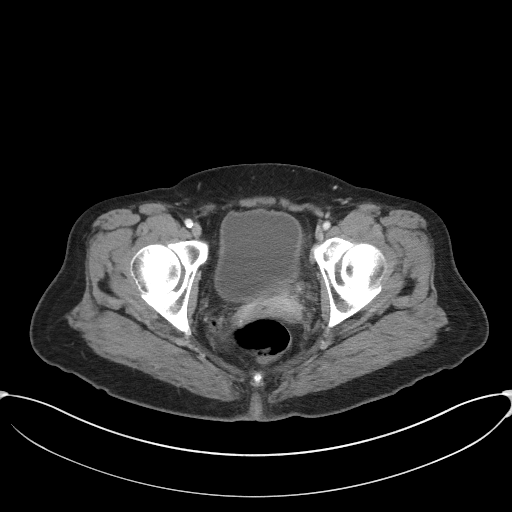
[im 25/88  soft-tissue]
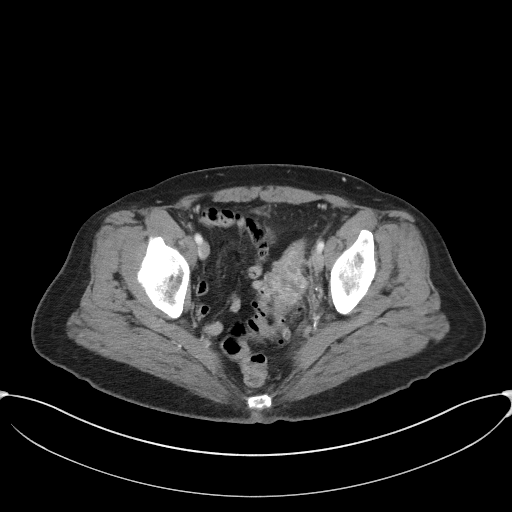
[im 30/88  soft-tissue]
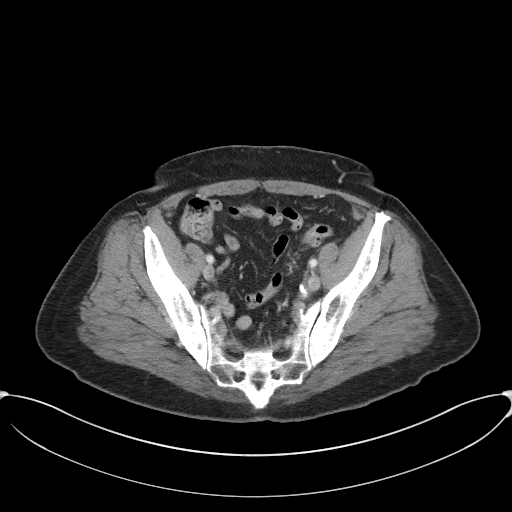
[im 39/88  soft-tissue]
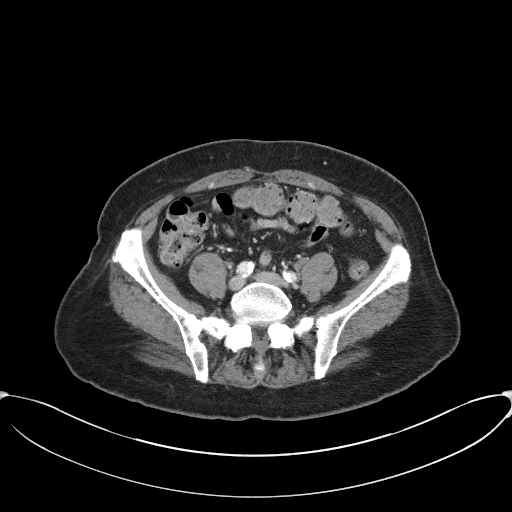
[im 44/88  soft-tissue]
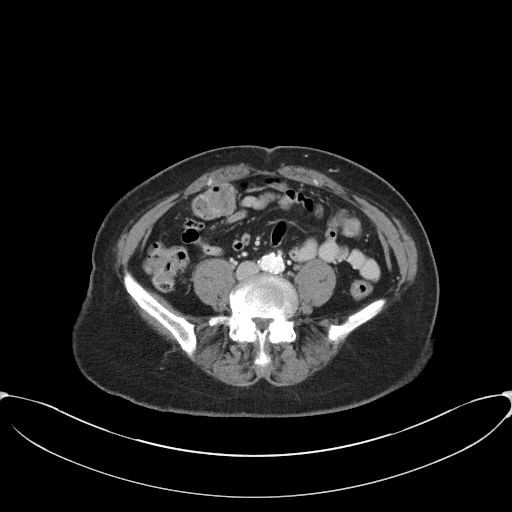
[im 49/88  soft-tissue]
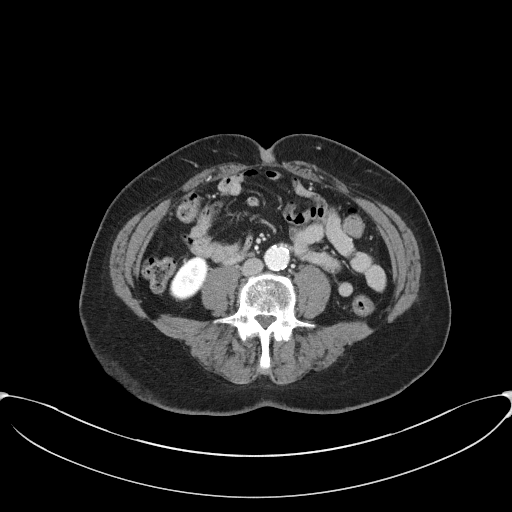
[im 59/88  soft-tissue]
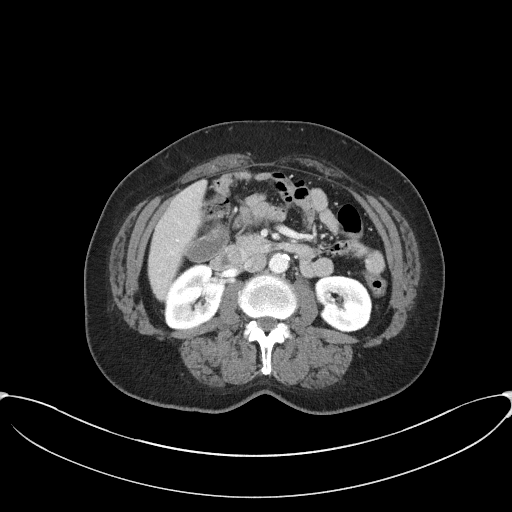
[im 59/88  bone]
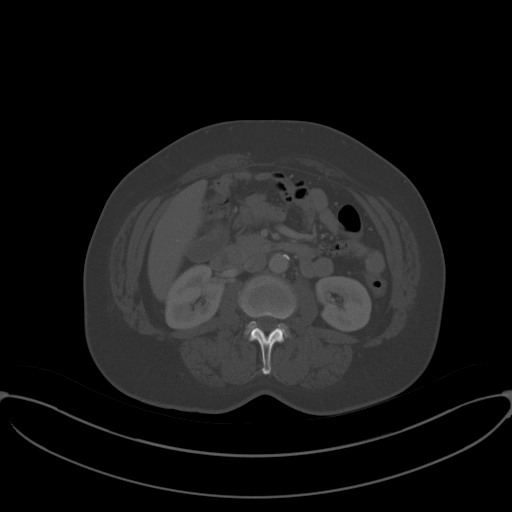
[im 63/88  soft-tissue]
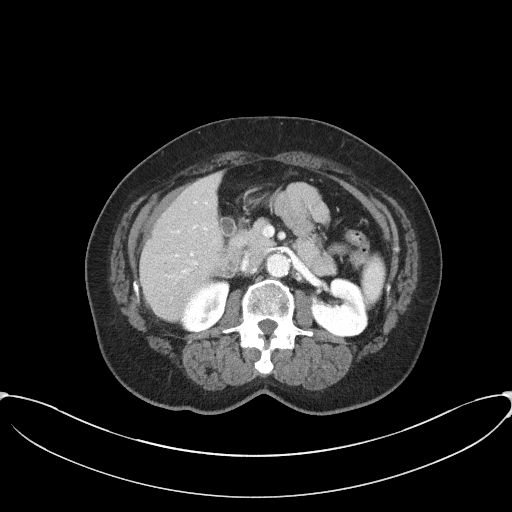
[im 68/88  soft-tissue]
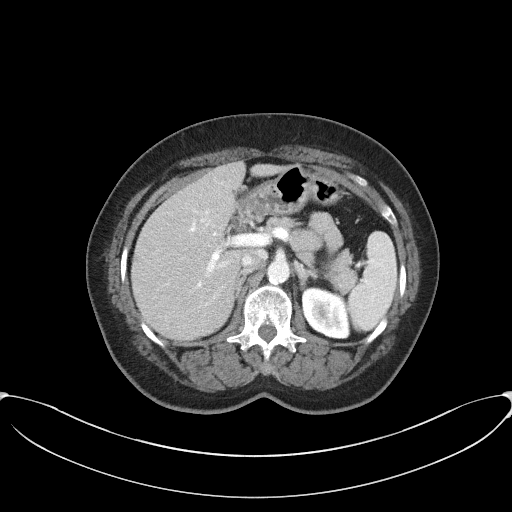
[im 78/88  soft-tissue]
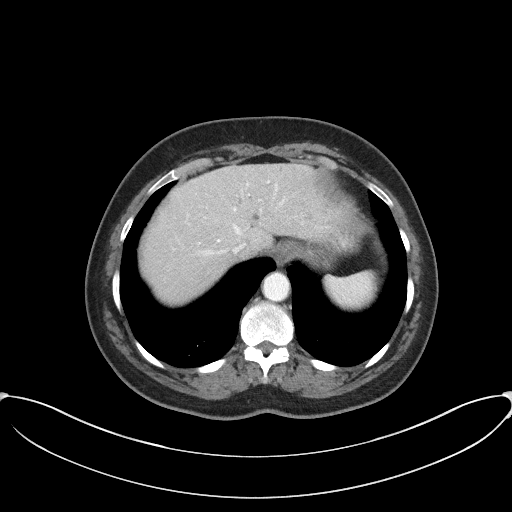
[im 83/88  soft-tissue]
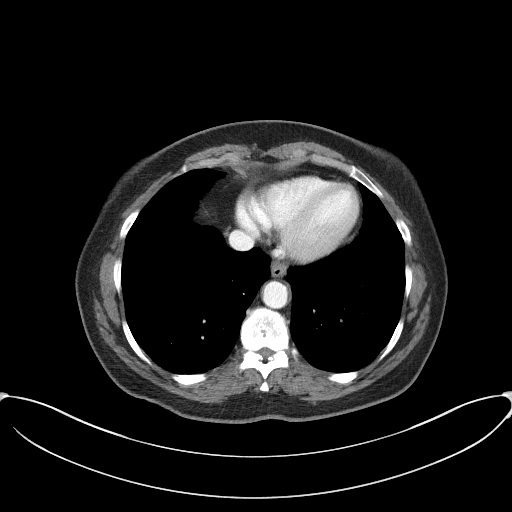

[Series 4: coronal st · coronal · 0.81mm/px · 3 of 91 slices shown]
[im 31/91  soft-tissue]
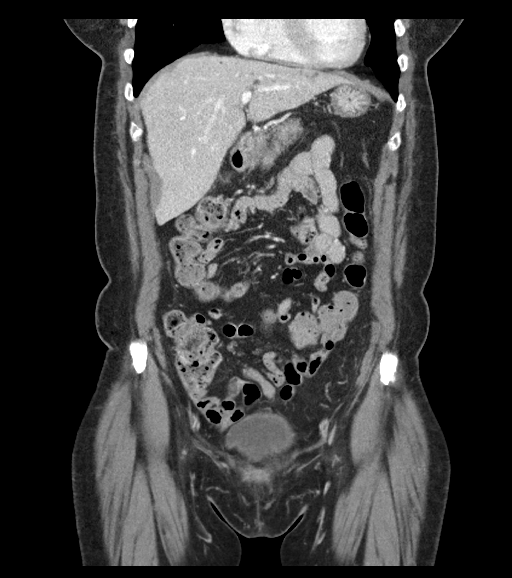
[im 41/91  soft-tissue]
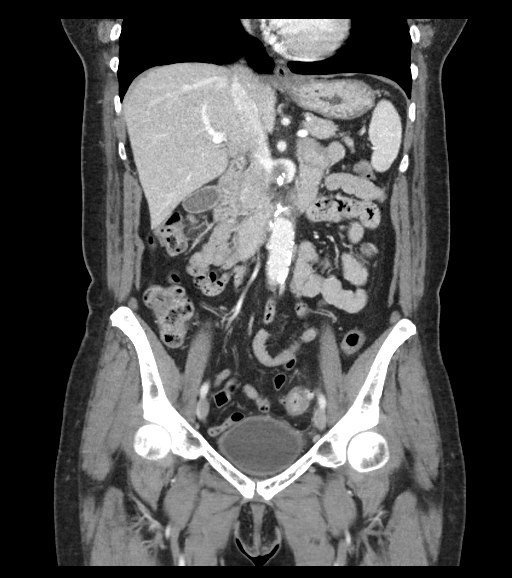
[im 51/91  soft-tissue]
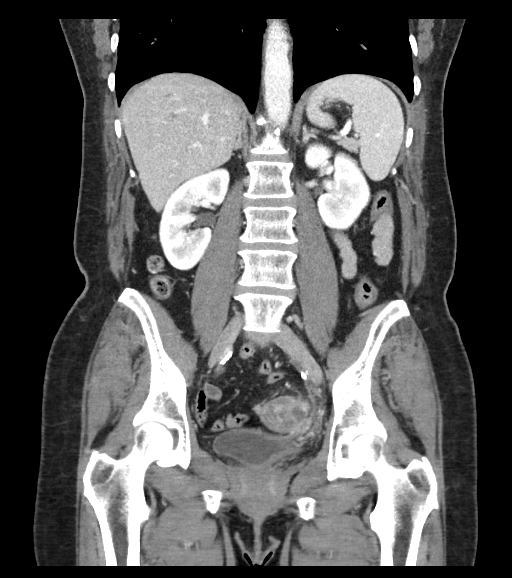

[16 of 46 positions shown; findings below may reference images not displayed]

FINDINGS: Lower chest: Lung bases are clear.

Hepatobiliary: Multiple small hypodense lesions in liver likely
represent small cysts. Gallbladder normal.

Pancreas: Pancreas is normal. No ductal dilatation. No pancreatic
inflammation.

Spleen: Normal spleen

Adrenals/urinary tract: Adrenal glands and kidneys are normal. The
ureters and bladder normal.

Stomach/Bowel: Stomach, duodenum small-bowel. Cecum and appendix are
normal. Ascending transverse colon normal. Descending colon normal.

There is inflammatory process along the anti mesenteric wall of the
proximal sigmoid colon. This inflammation occurs over approximately
5 cm segment (image 65/2). There appears to be small abscess within
the wall of the sigmoid colon with a rim enhancing low-density
collection measuring 1.7 cm image 65/2. This is through a region of
extensive diverticulosis. NO frank perforation or extraluminal
abscess.

Vascular/Lymphatic: Abdominal aorta is normal caliber with
atherosclerotic calcification. There is no retroperitoneal or
periportal lymphadenopathy. No pelvic lymphadenopathy.

Reproductive: Post hysterectomy no adnexal abnormality

Other: No free fluid.

Musculoskeletal: No aggressive osseous lesion.
IMPRESSION: 1. Acute diverticulitis of the proximal sigmoid with intramural
abscess. NO frank perforation or extraluminal abscess identified.
2. As there is asymmetric thickening through the sigmoid colon
recommend follow-up imaging to exclude an unlikely underlying
neoplasm.

## 2021-12-19 ENCOUNTER — Emergency Department (HOSPITAL_BASED_OUTPATIENT_CLINIC_OR_DEPARTMENT_OTHER)
Admission: EM | Admit: 2021-12-19 | Discharge: 2021-12-19 | Disposition: A | Discharge status: Home / Self Care | Payer: Medicare Other | Attending: Emergency Medicine | Admitting: Emergency Medicine

## 2021-12-19 ENCOUNTER — Other Ambulatory Visit: Payer: Self-pay

## 2021-12-19 ENCOUNTER — Emergency Department (HOSPITAL_BASED_OUTPATIENT_CLINIC_OR_DEPARTMENT_OTHER): Payer: Medicare Other

## 2021-12-19 ENCOUNTER — Encounter (HOSPITAL_BASED_OUTPATIENT_CLINIC_OR_DEPARTMENT_OTHER): Payer: Self-pay

## 2021-12-19 DIAGNOSIS — J069 Acute upper respiratory infection, unspecified: Secondary | ICD-10-CM | POA: Diagnosis not present

## 2021-12-19 DIAGNOSIS — R059 Cough, unspecified: Secondary | ICD-10-CM | POA: Diagnosis present

## 2021-12-19 DIAGNOSIS — Z7982 Long term (current) use of aspirin: Secondary | ICD-10-CM | POA: Insufficient documentation

## 2021-12-19 MED ORDER — AZITHROMYCIN 250 MG PO TABS: 250.0000 mg | ORAL_TABLET | Freq: Every day | ORAL | 0 refills | Status: DC

## 2021-12-19 MED ORDER — PREDNISONE 50 MG PO TABS
60.0000 mg | ORAL_TABLET | Freq: Once | ORAL | Status: AC
  Administered 2021-12-19: 60 mg via ORAL
  Filled 2021-12-19: qty 1

## 2021-12-19 MED ORDER — PREDNISONE 20 MG PO TABS: ORAL_TABLET | ORAL | 0 refills | Status: DC

## 2021-12-19 NOTE — ED Provider Notes (Signed)
?[facility] EMERGENCY DEPARTMENT ?Provider Note ? ? ?CSN: [id number] ?Arrival date & time: 12/19/21  1937 ? ?  ? ?History ? ?Chief Complaint  ?Patient presents with  ? Cough  ? ? ?Nesa Distel is a 71 y.o. female. ? ?71 yo F with a cc of with a chief complaints of cough.  This been going on for couple weeks.  She has been trying her medications at home without significant improvement.  She denies fevers or chills denies difficulty breathing.  She has been using her inhaler at home a little bit more often than normal and thinks that that is been helping.  No known sick contacts.  No abdominal discomfort. ? ? ?Cough ? ?  ? ?Home Medications ?Prior to Admission medications   ?Medication Sig Start Date End Date Taking? Authorizing Provider  ?azithromycin (ZITHROMAX) 250 MG tablet Take 1 tablet (250 mg total) by mouth daily. Take first 2 tablets together, then 1 every day until finished. 12/19/21  Yes Melene Plan, DO  ?predniSONE (DELTASONE) 20 MG tablet 2 tabs po daily x 4 days 12/19/21  Yes Melene Plan, DO  ?albuterol (VENTOLIN HFA) 108 (90 Base) MCG/ACT inhaler Inhale 2 puffs into the lungs daily as needed for wheezing or shortness of breath.  12/18/19   [provider]  ?aspirin 81 MG tablet Take 81 mg by mouth See admin instructions. Takes twice a week    [provider]  ?ezetimibe-simvastatin (VYTORIN) 10-20 MG tablet Take 1 tablet by mouth daily.    [provider]  ?lisinopril (ZESTRIL) 40 MG tablet Take 1 tablet (40 mg total) by mouth daily. 03/29/20   Lewie Chamber, MD  ?sodium chloride (OCEAN) 0.65 % SOLN nasal spray Place 1 spray into both nostrils as needed for congestion.    [provider]  ?   ? ?Allergies    ?Doxycycline and Strawberry (diagnostic)   ? ?Review of Systems   ?Review of Systems  ?Respiratory:  Positive for cough.   ? ?Physical Exam ?Updated Vital Signs ?BP 138/85   Pulse 78   Temp 98.4 ?F (36.9 ?C) (Oral)   Resp 18   Ht 5\' 6"  (1.676 m)   Wt 69.4  kg   SpO2 98%   BMI 24.69 kg/m?  ?Physical Exam ?Vitals and nursing note reviewed.  ?Constitutional:   ?   General: She is not in acute distress. ?   Appearance: She is well-developed. She is not diaphoretic.  ?HENT:  ?   Head: Normocephalic and atraumatic.  ?Eyes:  ?   Pupils: Pupils are equal, round, and reactive to light.  ?Cardiovascular:  ?   Rate and Rhythm: Normal rate and regular rhythm.  ?   Heart sounds: No murmur heard. ?  No friction rub. No gallop.  ?Pulmonary:  ?   Effort: Pulmonary effort is normal.  ?   Breath sounds: No wheezing or rales.  ?   Comments: Bronchospastic cough with diminished breath sounds in all fields ?Abdominal:  ?   General: There is no distension.  ?   Palpations: Abdomen is soft.  ?   Tenderness: There is no abdominal tenderness.  ?Musculoskeletal:     ?   General: No tenderness.  ?   Cervical back: Normal range of motion and neck supple.  ?Skin: ?   General: Skin is warm and dry.  ?Neurological:  ?   Mental Status: She is alert and oriented to person, place, and time.  ?Psychiatric:     ?  Behavior: Behavior normal.  ? ? ?ED Results / Procedures / Treatments   ?Labs ?(all labs ordered are listed, but only abnormal results are displayed) ?Labs Reviewed - No data to display ? ?EKG ?None ? ?Radiology ?DG Chest Port 1 View ? ?Result Date: 12/19/2021 ?CLINICAL DATA:  Cough. EXAM: PORTABLE CHEST 1 VIEW COMPARISON:  None. FINDINGS: The heart size and mediastinal contours are within normal limits. Both lungs are clear. The visualized skeletal structures are unremarkable. IMPRESSION: No active disease. Electronically Signed   By: Darliss Cheney M.D.   On: 12/19/2021 20:44   ? ?Procedures ?Procedures  ? ? ?Medications Ordered in ED ?Medications  ?predniSONE (DELTASONE) tablet 60 mg (60 mg Oral Given 12/19/21 2045)  ? ? ?ED Course/ Medical Decision Making/ A&P ?  ?                        ?Medical Decision Making ?Amount and/or Complexity of Data Reviewed ?Radiology:  ordered. ? ?Risk ?Prescription drug management. ? ? ?71 yo F with a chief complaints of cough going on for a few weeks now.  Bronchospastic cough on exam.  Offered 3 DuoNebs which she declined.  We will give a dose of steroids here chest x-ray to evaluate for pneumonia.  With progressive cough would consider treatment for pertussis. ?Chest x-ray independently interpreted by me without focal infiltrate or pneumothorax.  PCP follow-up. ? ?10:42 PM:  I have discussed the diagnosis/risks/treatment options with the patient.  Evaluation and diagnostic testing in the emergency department does not suggest an emergent condition requiring admission or immediate intervention beyond what has been performed at this time.  They will follow up with  PCP. We also discussed returning to the ED immediately if new or worsening sx occur. We discussed the sx which are most concerning (e.g., sudden worsening pain, fever, inability to tolerate by mouth) that necessitate immediate return. Medications administered to the patient during their visit and any new prescriptions provided to the patient are listed below. ? ?Medications given during this visit ?Medications  ?predniSONE (DELTASONE) tablet 60 mg (60 mg Oral Given 12/19/21 2045)  ? ? ? ?The patient appears reasonably screen and/or stabilized for discharge and I doubt any other medical condition or other [facility] requiring further screening, evaluation, or treatment in the ED at this time prior to discharge.  ? ? ? ? ? ? ? ? ?Final Clinical Impression(s) / ED Diagnoses ?Final diagnoses:  ?Viral URI with cough  ? ? ?Rx / DC Orders ?ED Discharge Orders   ? ?      Ordered  ?  predniSONE (DELTASONE) 20 MG tablet       ? 12/19/21 2047  ?  azithromycin (ZITHROMAX) 250 MG tablet  Daily       ? 12/19/21 2047  ? ?  ?  ? ?  ? ? ?  ?Melene Plan, DO ?12/19/21 2242 ? ?

## 2021-12-19 NOTE — ED Triage Notes (Signed)
Pt complains of cough over past 2 weeks, trying otc remedies with minimal relief.  Neb tx, MDI, zyrtec.  Denies fever.   ?

## 2021-12-19 NOTE — Discharge Instructions (Signed)
I am treating you with an antibiotic for possible pertussis.  Please use your breathing medicine every 4 hours while you are awake for at least the next couple days.  I have prescribed you steroids that should hopefully help with this as well.  Your chest x-ray did not show an obvious pneumonia.  Please follow-up with your doctor in the office.  Please return to the Emergency Department for worsening breathing confusion inability eat or drink. ?

## 2022-08-02 ENCOUNTER — Emergency Department (HOSPITAL_BASED_OUTPATIENT_CLINIC_OR_DEPARTMENT_OTHER): Payer: Medicare Other

## 2022-08-02 ENCOUNTER — Encounter (HOSPITAL_BASED_OUTPATIENT_CLINIC_OR_DEPARTMENT_OTHER): Payer: Self-pay | Admitting: Pediatrics

## 2022-08-02 ENCOUNTER — Other Ambulatory Visit: Payer: Self-pay

## 2022-08-02 ENCOUNTER — Emergency Department (HOSPITAL_BASED_OUTPATIENT_CLINIC_OR_DEPARTMENT_OTHER)
Admission: EM | Admit: 2022-08-02 | Discharge: 2022-08-02 | Disposition: A | Discharge status: Home / Self Care | Payer: Medicare Other | Attending: Emergency Medicine | Admitting: Emergency Medicine

## 2022-08-02 DIAGNOSIS — R0981 Nasal congestion: Secondary | ICD-10-CM | POA: Insufficient documentation

## 2022-08-02 DIAGNOSIS — J988 Other specified respiratory disorders: Secondary | ICD-10-CM

## 2022-08-02 DIAGNOSIS — Z7982 Long term (current) use of aspirin: Secondary | ICD-10-CM | POA: Diagnosis not present

## 2022-08-02 DIAGNOSIS — R059 Cough, unspecified: Secondary | ICD-10-CM | POA: Diagnosis not present

## 2022-08-02 DIAGNOSIS — R062 Wheezing: Secondary | ICD-10-CM | POA: Diagnosis not present

## 2022-08-02 MED ORDER — PREDNISONE 50 MG PO TABS
60.0000 mg | ORAL_TABLET | Freq: Once | ORAL | Status: AC
  Administered 2022-08-02: 60 mg via ORAL
  Filled 2022-08-02: qty 1

## 2022-08-02 MED ORDER — PREDNISONE 20 MG PO TABS: ORAL_TABLET | ORAL | 0 refills | Status: DC

## 2022-08-02 NOTE — ED Notes (Signed)
Discharge instructions reviewed with patient. Patient verbalizes understanding, no further questions at this time. Medications/prescriptions and follow up information provided. No acute distress noted at time of departure.  

## 2022-08-02 NOTE — ED Triage Notes (Signed)
C/O cough x 1 month; reported hx of asthma, denies fever, muscle aches and had a negative covid test at home.

## 2022-08-02 NOTE — Discharge Instructions (Signed)
Use your inhaler every 4 hours(6 puffs) while awake, return for sudden worsening shortness of breath, or if you need to use your inhaler more often.  ° °

## 2022-08-02 NOTE — ED Provider Notes (Signed)
[facility] EMERGENCY DEPARTMENT Provider Note   CSN: [id number] Arrival date & time: 08/02/22  1218     History  Chief Complaint  Patient presents with   Cough    Morgan Sparks is a 71 y.o. female.  71 yo F with a chief complaints of cough congestion wheezing.  This been going on for almost a month now.  She had recently seen her family doctor and gotten her Tdap.  She has been using her albuterol at home without significant improvement.  Came here for evaluation.  Was last here back in April and felt like steroids really helped.   Cough      Home Medications Prior to Admission medications   Medication Sig Start Date End Date Taking? Authorizing Provider  predniSONE (DELTASONE) 20 MG tablet 2 tabs po daily x 4 days 08/02/22  Yes Melene Plan, DO  albuterol (VENTOLIN HFA) 108 (90 Base) MCG/ACT inhaler Inhale 2 puffs into the lungs daily as needed for wheezing or shortness of breath.  12/18/19   [provider]  aspirin 81 MG tablet Take 81 mg by mouth See admin instructions. Takes twice a week    [provider]  azithromycin (ZITHROMAX) 250 MG tablet Take 1 tablet (250 mg total) by mouth daily. Take first 2 tablets together, then 1 every day until finished. 12/19/21   Melene Plan, DO  ezetimibe-simvastatin (VYTORIN) 10-20 MG tablet Take 1 tablet by mouth daily.    [provider]  lisinopril (ZESTRIL) 40 MG tablet Take 1 tablet (40 mg total) by mouth daily. 03/29/20   Lewie Chamber, MD  sodium chloride (OCEAN) 0.65 % SOLN nasal spray Place 1 spray into both nostrils as needed for congestion.    [provider]      Allergies    Doxycycline and Strawberry (diagnostic)    Review of Systems   Review of Systems  Respiratory:  Positive for cough.     Physical Exam Updated Vital Signs BP (!) 177/91 (BP Location: Left Arm)   Pulse 81   Temp 98 F (36.7 C) (Oral)   Resp 18   Ht 5\' 5"  (1.651 m)   Wt 68.9 kg   SpO2 97%   BMI 25.29  kg/m  Physical Exam Vitals and nursing note reviewed.  Constitutional:      General: She is not in acute distress.    Appearance: She is well-developed. She is not diaphoretic.  HENT:     Head: Normocephalic and atraumatic.  Eyes:     Pupils: Pupils are equal, round, and reactive to light.  Cardiovascular:     Rate and Rhythm: Normal rate and regular rhythm.     Heart sounds: No murmur heard.    No friction rub. No gallop.  Pulmonary:     Effort: Pulmonary effort is normal.     Breath sounds: No wheezing or rales.     Comments: Good aeration, very faint end expiratory wheezes with prolonged expiratory effort.  Bronchospastic cough. Abdominal:     General: There is no distension.     Palpations: Abdomen is soft.     Tenderness: There is no abdominal tenderness.  Musculoskeletal:        General: No tenderness.     Cervical back: Normal range of motion and neck supple.  Skin:    General: Skin is warm and dry.  Neurological:     Mental Status: She is alert and oriented to person, place, and time.  Psychiatric:  Behavior: Behavior normal.     ED Results / Procedures / Treatments   Labs (all labs ordered are listed, but only abnormal results are displayed) Labs Reviewed - No data to display  EKG None  Radiology DG Chest 2 View  Result Date: 08/02/2022 CLINICAL DATA:  Cough. EXAM: CHEST - 2 VIEW COMPARISON:  Chest radiograph 12/19/2021 FINDINGS: The cardiomediastinal silhouette is within normal limits. The lungs are well inflated. No airspace consolidation, edema, pleural effusion, or pneumothorax is identified. No acute osseous abnormality is seen. IMPRESSION: No active cardiopulmonary disease. Electronically Signed   By: Sebastian Ache M.D.   On: 08/02/2022 12:48    Procedures Procedures    Medications Ordered in ED Medications  predniSONE (DELTASONE) tablet 60 mg (has no administration in time range)    ED Course/ Medical Decision Making/ A&P                            Medical Decision Making Amount and/or Complexity of Data Reviewed Radiology: ordered.  Risk Prescription drug management.   71 yo F with a chief complaints of cough congestion and difficulty breathing.  This has been going on now for about a month.  She is well-appearing and nontoxic.  Has very faint end expiratory wheezes and a bronchospastic cough on my exam.  She tells me that steroids really helped her the last time that she had an illness like this.  We will give her a burst dose prednisone.  PCP follow-up.  Chest x-ray independently interpreted by me without focal infiltrate or pneumothorax.  1:31 PM:  I have discussed the diagnosis/risks/treatment options with the patient.  Evaluation and diagnostic testing in the emergency department does not suggest an emergent condition requiring admission or immediate intervention beyond what has been performed at this time.  They will follow up with PCP. We also discussed returning to the ED immediately if new or worsening sx occur. We discussed the sx which are most concerning (e.g., sudden worsening pain, fever, inability to tolerate by mouth) that necessitate immediate return. Medications administered to the patient during their visit and any new prescriptions provided to the patient are listed below.  Medications given during this visit Medications  predniSONE (DELTASONE) tablet 60 mg (has no administration in time range)     The patient appears reasonably screen and/or stabilized for discharge and I doubt any other medical condition or other [facility] requiring further screening, evaluation, or treatment in the ED at this time prior to discharge.          Final Clinical Impression(s) / ED Diagnoses Final diagnoses:  Wheezing-associated respiratory infection (WARI)    Rx / DC Orders ED Discharge Orders          Ordered    predniSONE (DELTASONE) 20 MG tablet        08/02/22 1329              Melene Plan,  DO 08/02/22 1331

## 2022-12-17 IMAGING — DX DG CHEST 1V PORT
1 series · 1 of 1 positions shown · non-contrast
Comparison: None.

CLINICAL DATA: Cough.

EXAM:
PORTABLE CHEST 1 VIEW

[chest ap]
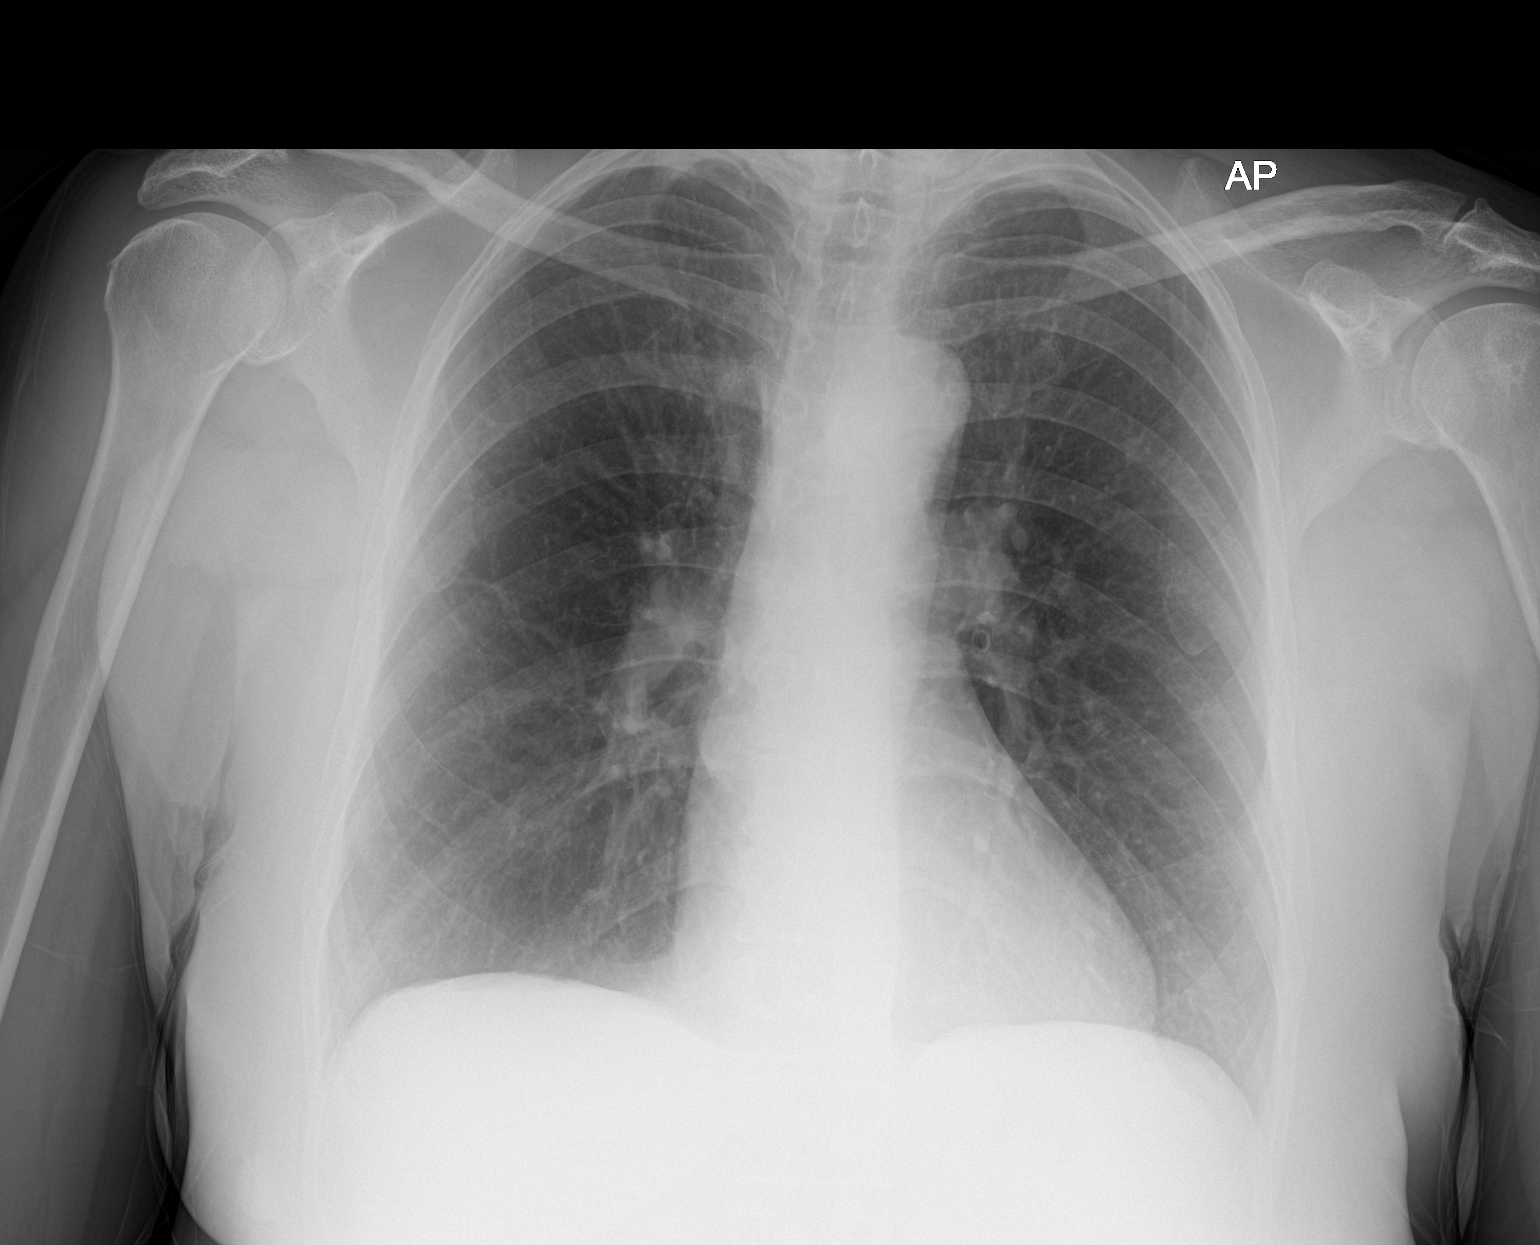

[1 of 1 positions shown; findings below may reference images not displayed]

FINDINGS: The heart size and mediastinal contours are within normal limits.
Both lungs are clear. The visualized skeletal structures are
unremarkable.
IMPRESSION: No active disease.

## 2022-12-19 ENCOUNTER — Inpatient Hospital Stay (HOSPITAL_BASED_OUTPATIENT_CLINIC_OR_DEPARTMENT_OTHER)
Admission: EM | Admit: 2022-12-19 | Discharge: 2022-12-21 | DRG: 445 | Disposition: A | Discharge status: Home / Self Care | Payer: Medicare Other | Attending: Internal Medicine | Admitting: Internal Medicine

## 2022-12-19 ENCOUNTER — Emergency Department (HOSPITAL_BASED_OUTPATIENT_CLINIC_OR_DEPARTMENT_OTHER): Payer: Medicare Other

## 2022-12-19 ENCOUNTER — Encounter (HOSPITAL_BASED_OUTPATIENT_CLINIC_OR_DEPARTMENT_OTHER): Payer: Self-pay

## 2022-12-19 ENCOUNTER — Other Ambulatory Visit: Payer: Self-pay

## 2022-12-19 DIAGNOSIS — Z90710 Acquired absence of both cervix and uterus: Secondary | ICD-10-CM | POA: Diagnosis not present

## 2022-12-19 DIAGNOSIS — R8271 Bacteriuria: Secondary | ICD-10-CM | POA: Diagnosis present

## 2022-12-19 DIAGNOSIS — G8929 Other chronic pain: Secondary | ICD-10-CM | POA: Diagnosis present

## 2022-12-19 DIAGNOSIS — Z7951 Long term (current) use of inhaled steroids: Secondary | ICD-10-CM | POA: Diagnosis not present

## 2022-12-19 DIAGNOSIS — Z79899 Other long term (current) drug therapy: Secondary | ICD-10-CM | POA: Diagnosis not present

## 2022-12-19 DIAGNOSIS — R9431 Abnormal electrocardiogram [ECG] [EKG]: Secondary | ICD-10-CM | POA: Diagnosis present

## 2022-12-19 DIAGNOSIS — R109 Unspecified abdominal pain: Secondary | ICD-10-CM

## 2022-12-19 DIAGNOSIS — Z91018 Allergy to other foods: Secondary | ICD-10-CM

## 2022-12-19 DIAGNOSIS — N179 Acute kidney failure, unspecified: Secondary | ICD-10-CM | POA: Diagnosis present

## 2022-12-19 DIAGNOSIS — Z87891 Personal history of nicotine dependence: Secondary | ICD-10-CM | POA: Diagnosis not present

## 2022-12-19 DIAGNOSIS — K2970 Gastritis, unspecified, without bleeding: Secondary | ICD-10-CM | POA: Diagnosis present

## 2022-12-19 DIAGNOSIS — K219 Gastro-esophageal reflux disease without esophagitis: Secondary | ICD-10-CM | POA: Diagnosis present

## 2022-12-19 DIAGNOSIS — Z881 Allergy status to other antibiotic agents status: Secondary | ICD-10-CM | POA: Diagnosis not present

## 2022-12-19 DIAGNOSIS — E785 Hyperlipidemia, unspecified: Secondary | ICD-10-CM | POA: Diagnosis present

## 2022-12-19 DIAGNOSIS — E7800 Pure hypercholesterolemia, unspecified: Secondary | ICD-10-CM | POA: Diagnosis present

## 2022-12-19 DIAGNOSIS — R911 Solitary pulmonary nodule: Secondary | ICD-10-CM | POA: Diagnosis present

## 2022-12-19 DIAGNOSIS — Z86010 Personal history of colonic polyps: Secondary | ICD-10-CM

## 2022-12-19 DIAGNOSIS — D7589 Other specified diseases of blood and blood-forming organs: Secondary | ICD-10-CM | POA: Diagnosis present

## 2022-12-19 DIAGNOSIS — E871 Hypo-osmolality and hyponatremia: Secondary | ICD-10-CM | POA: Diagnosis present

## 2022-12-19 DIAGNOSIS — R1011 Right upper quadrant pain: Principal | ICD-10-CM

## 2022-12-19 DIAGNOSIS — J449 Chronic obstructive pulmonary disease, unspecified: Secondary | ICD-10-CM | POA: Diagnosis present

## 2022-12-19 DIAGNOSIS — K810 Acute cholecystitis: Principal | ICD-10-CM | POA: Diagnosis present

## 2022-12-19 DIAGNOSIS — I10 Essential (primary) hypertension: Secondary | ICD-10-CM | POA: Diagnosis present

## 2022-12-19 DIAGNOSIS — Z8719 Personal history of other diseases of the digestive system: Secondary | ICD-10-CM

## 2022-12-19 DIAGNOSIS — Z7982 Long term (current) use of aspirin: Secondary | ICD-10-CM | POA: Diagnosis not present

## 2022-12-19 HISTORY — DX: Diverticulitis of intestine, part unspecified, without perforation or abscess without bleeding: K57.92

## 2022-12-19 LAB — URINALYSIS, MICROSCOPIC (REFLEX): RBC / HPF: NONE SEEN RBC/hpf (ref 0–5)

## 2022-12-19 LAB — URINALYSIS, ROUTINE W REFLEX MICROSCOPIC
Bilirubin Urine: NEGATIVE
Glucose, UA: NEGATIVE mg/dL
Hgb urine dipstick: NEGATIVE
Ketones, ur: NEGATIVE mg/dL
Nitrite: NEGATIVE
Protein, ur: NEGATIVE mg/dL
Specific Gravity, Urine: 1.015 (ref 1.005–1.030)
pH: 5.5 (ref 5.0–8.0)

## 2022-12-19 LAB — COMPREHENSIVE METABOLIC PANEL
ALT: 26 U/L (ref 0–44)
AST: 24 U/L (ref 15–41)
Albumin: 4.6 g/dL (ref 3.5–5.0)
Alkaline Phosphatase: 97 U/L (ref 38–126)
Anion gap: 11 (ref 5–15)
BUN: 25 mg/dL — ABNORMAL HIGH (ref 8–23)
CO2: 26 mmol/L (ref 22–32)
Calcium: 10.2 mg/dL (ref 8.9–10.3)
Chloride: 96 mmol/L — ABNORMAL LOW (ref 98–111)
Creatinine, Ser: 1.21 mg/dL — ABNORMAL HIGH (ref 0.44–1.00)
GFR, Estimated: 48 mL/min — ABNORMAL LOW (ref >60)
Glucose, Bld: 95 mg/dL (ref 70–99)
Potassium: 3.7 mmol/L (ref 3.5–5.1)
Sodium: 133 mmol/L — ABNORMAL LOW (ref 135–145)
Total Bilirubin: 0.8 mg/dL (ref 0.3–1.2)
Total Protein: 8.3 g/dL — ABNORMAL HIGH (ref 6.5–8.1)

## 2022-12-19 LAB — CBC
HCT: 46.7 % — ABNORMAL HIGH (ref 36.0–46.0)
Hemoglobin: 15.7 g/dL — ABNORMAL HIGH (ref 12.0–15.0)
MCH: 33.9 pg (ref 26.0–34.0)
MCHC: 33.6 g/dL (ref 30.0–36.0)
MCV: 100.9 fL — ABNORMAL HIGH (ref 80.0–100.0)
Platelets: 248 10*3/uL (ref 150–400)
RBC: 4.63 MIL/uL (ref 3.87–5.11)
RDW: 13.2 % (ref 11.5–15.5)
WBC: 5 10*3/uL (ref 4.0–10.5)
nRBC: 0 % (ref 0.0–0.2)

## 2022-12-19 LAB — TROPONIN I (HIGH SENSITIVITY)
Troponin I (High Sensitivity): <2 ng/L (ref <18)
Troponin I (High Sensitivity): <2 ng/L (ref <18)

## 2022-12-19 LAB — D-DIMER, QUANTITATIVE: D-Dimer, Quant: 0.46 ug/mL-FEU (ref 0.00–0.50)

## 2022-12-19 LAB — LIPASE, BLOOD: Lipase: 23 U/L (ref 11–51)

## 2022-12-19 MED ORDER — HYDROMORPHONE HCL 1 MG/ML IJ SOLN
0.5000 mg | Freq: Once | INTRAMUSCULAR | Status: AC
  Administered 2022-12-19: 0.5 mg via INTRAVENOUS
  Filled 2022-12-19: qty 1

## 2022-12-19 MED ORDER — HYDROMORPHONE HCL 1 MG/ML IJ SOLN
0.5000 mg | Freq: Once | INTRAMUSCULAR | Status: AC | PRN
  Administered 2022-12-19: 0.5 mg via INTRAVENOUS
  Filled 2022-12-19: qty 0.5

## 2022-12-19 MED ORDER — FENTANYL CITRATE PF 50 MCG/ML IJ SOSY
50.0000 ug | PREFILLED_SYRINGE | Freq: Once | INTRAMUSCULAR | Status: AC
  Administered 2022-12-19: 50 ug via INTRAVENOUS
  Filled 2022-12-19: qty 1

## 2022-12-19 MED ORDER — ONDANSETRON HCL 4 MG/2ML IJ SOLN
4.0000 mg | Freq: Once | INTRAMUSCULAR | Status: AC
  Administered 2022-12-19: 4 mg via INTRAVENOUS
  Filled 2022-12-19: qty 2

## 2022-12-19 MED ORDER — ORAL CARE MOUTH RINSE: 15.0000 mL | OROMUCOSAL | Status: DC | PRN

## 2022-12-19 MED ORDER — NALOXONE HCL 0.4 MG/ML IJ SOLN: 0.4000 mg | INTRAMUSCULAR | Status: DC | PRN

## 2022-12-19 MED ORDER — PIPERACILLIN-TAZOBACTAM 3.375 G IVPB 30 MIN
3.3750 g | Freq: Once | INTRAVENOUS | Status: AC
  Administered 2022-12-19: 3.375 g via INTRAVENOUS
  Filled 2022-12-19: qty 50

## 2022-12-19 MED ORDER — IOHEXOL 350 MG/ML SOLN
80.0000 mL | Freq: Once | INTRAVENOUS | Status: AC | PRN
  Administered 2022-12-19: 80 mL via INTRAVENOUS

## 2022-12-19 MED ORDER — SODIUM CHLORIDE 0.9 % IV SOLN: INTRAVENOUS | Status: DC

## 2022-12-19 MED ORDER — SODIUM CHLORIDE 0.9 % IV BOLUS
1000.0000 mL | Freq: Once | INTRAVENOUS | Status: AC
  Administered 2022-12-19: 1000 mL via INTRAVENOUS

## 2022-12-19 NOTE — ED Notes (Signed)
Called [facility] for transport at 4:14.

## 2022-12-19 NOTE — ED Triage Notes (Signed)
Pt reports pain and cramping under right breast  Pain began this am while getting ready for church. Denies N/V/D. Endorses belching

## 2022-12-19 NOTE — ED Notes (Signed)
Attempted report at 1620. RN unavailable and will return my call.

## 2022-12-19 NOTE — Consult Note (Signed)
Reason for Consult: Abdominal pain Referring Physician: Dr. Ignacia Palma is an 72 y.o. female.  HPI: Patient is a 72 year old female, with a history of hyperlipidemia, diverticulitis has a surgical history of abdominal hysterectomy.  She comes in today secondary to abdominal pain that she states in the epigastrium that radiates to the right upper quadrant area.  She states that the pain began this morning after eating a banana.  She did not have any significant meal.  This was on her way to church.  States that she had the similar pain in approximately 2 to 3 days ago.  States this was also on an empty stomach.  Patient was seen in outside ED underwent CT scan and ultrasound.  Ultrasound showed no gallstones.  There was some 5 mm gallbladder wall thickening.  CT scan was negative for any acute biliary process.  Patient with normal LFTs.  Patient with no white count.  I did review the patient's CT scan and laboratory studies personally.  Of note patient denies any BC powder, NSAID use.    Past Medical History:  Diagnosis Date   Diverticulitis    High cholesterol     Past Surgical History:  Procedure Laterality Date   ABDOMINAL HYSTERECTOMY      History reviewed. No pertinent family history.  Social History:  reports that she has quit smoking. She has never used smokeless tobacco. She reports current alcohol use. No history on file for drug use.  Allergies:  Allergies  Allergen Reactions   Doxycycline Hives   Strawberry (Diagnostic) Hives    Medications: I have reviewed the patient's current medications.  Results for orders placed or performed during the hospital encounter of 12/19/22 (from the past 48 hour(s))  Lipase, blood     Status: None   Collection Time: 12/19/22  9:45 AM  Result Value Ref Range   Lipase 23 11 - 51 U/L    Comment: Performed at [facility], [address] [place], [place] [address]  Comprehensive metabolic panel     Status:  Abnormal   Collection Time: 12/19/22  9:45 AM  Result Value Ref Range   Sodium 133 (L) 135 - 145 mmol/L   Potassium 3.7 3.5 - 5.1 mmol/L   Chloride 96 (L) 98 - 111 mmol/L   CO2 26 22 - 32 mmol/L   Glucose, Bld 95 70 - 99 mg/dL    Comment: Glucose reference range applies only to samples taken after fasting for at least 8 hours.   BUN 25 (H) 8 - 23 mg/dL   Creatinine, Ser 1.21 (H) 0.44 - 1.00 mg/dL   Calcium 10.2 8.9 - 10.3 mg/dL   Total Protein 8.3 (H) 6.5 - 8.1 g/dL   Albumin 4.6 3.5 - 5.0 g/dL   AST 24 15 - 41 U/L   ALT 26 0 - 44 U/L   Alkaline Phosphatase 97 38 - 126 U/L   Total Bilirubin 0.8 0.3 - 1.2 mg/dL   GFR, Estimated 48 (L) >60 mL/min    Comment: (NOTE) Calculated using the CKD-EPI Creatinine Equation (2021)    Anion gap 11 5 - 15    Comment: Performed at [facility], [address] [place], [place] [address]  CBC     Status: Abnormal   Collection Time: 12/19/22  9:45 AM  Result Value Ref Range   WBC 5.0 4.0 - 10.5 K/uL   RBC 4.63 3.87 - 5.11 MIL/uL   Hemoglobin 15.7 (H)  12.0 - 15.0 g/dL   HCT 46.7 (H) 36.0 - 46.0 %   MCV 100.9 (H) 80.0 - 100.0 fL   MCH 33.9 26.0 - 34.0 pg   MCHC 33.6 30.0 - 36.0 g/dL   RDW 13.2 11.5 - 15.5 %   Platelets 248 150 - 400 K/uL   nRBC 0.0 0.0 - 0.2 %    Comment: Performed at [facility], [address] [place], [place] [address]  Troponin I (High Sensitivity)     Status: None   Collection Time: 12/19/22  9:45 AM  Result Value Ref Range   Troponin I (High Sensitivity) <2 <18 ng/L    Comment: (NOTE) Elevated high sensitivity troponin I (hsTnI) values and significant  changes across serial measurements may suggest ACS but many other  chronic and acute conditions are known to elevate hsTnI results.  Refer to the "Links" section for chest pain algorithms and additional  guidance. Performed at [facility], [address] [place], [place] [address]   D-dimer, quantitative     Status: None    Collection Time: 12/19/22  9:45 AM  Result Value Ref Range   D-Dimer, Quant 0.46 0.00 - 0.50 ug/mL-FEU    Comment: (NOTE) At the manufacturer cut-off value of 0.5 g/mL FEU, this assay has a negative predictive value of 95-100%.This assay is intended for use in conjunction with a clinical pretest probability (PTP) assessment model to exclude pulmonary embolism (PE) and deep venous thrombosis (DVT) in outpatients suspected of PE or DVT. Results should be correlated with clinical presentation. Performed at [facility], [address] [place], [place] [address]   Urinalysis, Routine w reflex microscopic -Urine, Clean Catch     Status: Abnormal   Collection Time: 12/19/22 10:02 AM  Result Value Ref Range   Color, Urine YELLOW YELLOW   APPearance CLEAR CLEAR   Specific Gravity, Urine 1.015 1.005 - 1.030   pH 5.5 5.0 - 8.0   Glucose, UA NEGATIVE NEGATIVE mg/dL   Hgb urine dipstick NEGATIVE NEGATIVE   Bilirubin Urine NEGATIVE NEGATIVE   Ketones, ur NEGATIVE NEGATIVE mg/dL   Protein, ur NEGATIVE NEGATIVE mg/dL   Nitrite NEGATIVE NEGATIVE   Leukocytes,Ua TRACE (A) NEGATIVE    Comment: Performed at [facility], [address] [place], [place] [address]  Urinalysis, Microscopic (reflex)     Status: Abnormal   Collection Time: 12/19/22 10:02 AM  Result Value Ref Range   RBC / HPF NONE SEEN 0 - 5 RBC/hpf   WBC, UA 6-10 0 - 5 WBC/hpf   Bacteria, UA MANY (A) NONE SEEN   Squamous Epithelial / HPF 6-10 0 - 5 /HPF    Comment: Performed at [facility], [address] [place], [place] [address]  Troponin I (High Sensitivity)     Status: None   Collection Time: 12/19/22 11:46 AM  Result Value Ref Range   Troponin I (High Sensitivity) <2 <18 ng/L    Comment: (NOTE) Elevated high sensitivity troponin I (hsTnI) values and significant  changes across serial measurements may suggest ACS but many other  chronic and acute conditions are known to elevate hsTnI  results.  Refer to the "Links" section for chest pain algorithms and additional  guidance. Performed at [facility], [address] [place], [place] [address]     CT Angio Chest/Abd/Pel for Dissection W and/or Wo Contrast  Result Date: 12/19/2022 CLINICAL DATA:  Pain and cramping under the right breast with concern for acute aortic syndrome EXAM: CT ANGIOGRAPHY CHEST, ABDOMEN AND PELVIS TECHNIQUE: Non-contrast CT of the chest was initially obtained. Multidetector CT imaging through the chest, abdomen and pelvis was performed using the standard protocol during bolus administration of intravenous contrast. Multiplanar reconstructed images and MIPs were obtained and reviewed to evaluate the vascular anatomy. RADIATION DOSE REDUCTION: This exam was performed according to the departmental dose-optimization program which includes automated exposure control, adjustment of the mA and/or kV according to patient size and/or use of iterative reconstruction technique. CONTRAST:  73mL OMNIPAQUE IOHEXOL 350 MG/ML SOLN COMPARISON:  CT abdomen and pelvis dated 03/28/2020 FINDINGS: CTA CHEST FINDINGS Cardiovascular: Preferential opacification of the thoracic aorta. No evidence of thoracic aortic aneurysm or dissection. Normal heart size. No pericardial effusion. Coronary artery calcifications. No central pulmonary emboli. Moderate narrowing of the left subclavian artery origin due to atherosclerotic plaque. Mediastinum/Nodes: Imaged thyroid gland without nodules meeting criteria for imaging follow-up by size. Normal esophagus. No pathologically enlarged axillary, supraclavicular, mediastinal, or hilar lymph nodes. Lungs/Pleura: The central airways are patent. Layering secretions within the trachea. No focal consolidation. Lateral right apical nodule measures 4 x 3 mm (6:9 no pneumothorax. No pleural effusion. Musculoskeletal: No acute or abnormal lytic or blastic osseous lesions. Review of the MIP images  confirms the above findings. CTA ABDOMEN AND PELVIS FINDINGS VASCULAR Aorta: Aortic atherosclerosis. Normal caliber aorta without aneurysm, dissection, vasculitis or significant stenosis. Celiac: Patent without evidence of aneurysm, dissection, vasculitis or significant stenosis. SMA: Patent without evidence of aneurysm, dissection, vasculitis or significant stenosis. Renals: Single renal arteries. Moderate narrowing of the right renal artery origin and mild narrowing of the left renal artery origin. No evidence of aneurysm, dissection, vasculitis, or fibromuscular dysplasia. IMA: Patent without evidence of aneurysm, dissection, vasculitis or significant stenosis. Inflow: Segmental mild-to-moderate narrowing due to atherosclerotic plaque. No evidence of aneurysm, dissection, or vasculitis. Proximal Outflow: Bilateral common femoral and visualized portions of the superficial and profunda femoral arteries are patent without evidence of aneurysm, dissection, vasculitis or significant stenosis. Veins: No obvious venous abnormality within the limitations of this arterial phase study. Review of the MIP images confirms the above findings. NON-VASCULAR Hepatobiliary: No focal hepatic lesions. No intra or extrahepatic biliary ductal dilation. Normal gallbladder. Pancreas: No focal lesions or main ductal dilation. Spleen: Normal in size without focal abnormality. Adrenals/Urinary Tract: No adrenal nodules. No suspicious renal mass, calculi or hydronephrosis. No focal bladder wall thickening. Stomach/Bowel: Normal appearance of the stomach. No evidence of bowel wall thickening, distention, or inflammatory changes. Colonic diverticulosis without acute diverticulitis. Normal appendix. Lymphatic: No enlarged abdominal or pelvic lymph nodes. Reproductive: No adnexal masses. Other: No free fluid, fluid collection, or free air. Musculoskeletal: No acute or abnormal lytic or blastic osseous lesions. Review of the MIP images confirms  the above findings. IMPRESSION: 1. No evidence of acute aortic syndrome. 2. No acute abnormality in the chest, abdomen, or pelvis. 3. Extensive calcified and noncalcified atherosclerotic plaque. Aortic Atherosclerosis (ICD10-I70.0). Coronary artery calcifications. Assessment for potential risk factor modification, dietary therapy or pharmacologic therapy may be warranted, if clinically indicated. 4. Right apical 4 x 3 mm pulmonary nodule. No follow-up needed if patient is low-risk.This recommendation follows the consensus statement: Guidelines for Management of Incidental Pulmonary Nodules Detected on CT Images: From the Fleischner Society 2017; Radiology 2017; 284:228-243. Electronically Signed   By: Darrin Nipper M.D.   On: 12/19/2022 13:21   US Abdomen Limited RUQ (LIVER/GB)  Result Date:  12/19/2022 CLINICAL DATA:  Right upper quadrant pain. EXAM: ULTRASOUND ABDOMEN LIMITED RIGHT UPPER QUADRANT COMPARISON:  None Available. FINDINGS: Gallbladder: Gallbladder wall thickening measuring up to 5 mm. Per the performing sonographer, there was a positive sonographic Murphy sign. There is no evidence of cholelithiasis. Common bile duct: Diameter: 2 mm, which is normal Liver: No focal lesion identified. Within normal limits in parenchymal echogenicity. Portal vein is patent on color Doppler imaging with normal direction of blood flow towards the liver. Other: None. IMPRESSION: Gallbladder wall thickening with positive sonographic Murphy sign. No evidence of cholelithiasis. Findings are equivocal for acute cholecystitis. Electronically Signed   By: Marin Roberts M.D.   On: 12/19/2022 11:59    Review of Systems  Constitutional:  Negative for chills and fever.  HENT:  Negative for ear discharge, hearing loss and sore throat.   Eyes:  Negative for discharge.  Respiratory:  Negative for cough and shortness of breath.   Cardiovascular:  Negative for chest pain and leg swelling.  Gastrointestinal:  Positive for abdominal  pain. Negative for constipation, diarrhea, nausea and vomiting.  Musculoskeletal:  Negative for myalgias and neck pain.  Skin:  Negative for rash.  Allergic/Immunologic: Negative for environmental allergies.  Neurological:  Negative for dizziness and seizures.  Hematological:  Does not bruise/bleed easily.  Psychiatric/Behavioral:  Negative for suicidal ideas.   All other systems reviewed and are negative.  Blood pressure (!) 153/90, pulse 73, temperature 97.7 F (36.5 C), temperature source Oral, resp. rate 18, SpO2 95 %. Physical Exam Constitutional:      Appearance: She is well-developed.     Comments: Conversant No acute distress  HENT:     Head: Normocephalic and atraumatic.  Eyes:     General: Lids are normal. No scleral icterus.    Pupils: Pupils are equal, round, and reactive to light.     Comments: Pupils are equal round and reactive No lid lag Moist conjunctiva  Neck:     Thyroid: No thyromegaly.     Trachea: No tracheal tenderness.     Comments: No cervical lymphadenopathy Cardiovascular:     Rate and Rhythm: Normal rate and regular rhythm.     Heart sounds: No murmur heard. Pulmonary:     Effort: Pulmonary effort is normal.     Breath sounds: Normal breath sounds. No wheezing or rales.  Abdominal:     Tenderness: There is abdominal tenderness (min) in the right upper quadrant.     Hernia: No hernia is present.  Musculoskeletal:     Cervical back: Normal range of motion and neck supple.  Skin:    General: Skin is warm.     Findings: No rash.     Nails: There is no clubbing.     Comments: Normal skin turgor  Neurological:     Mental Status: She is alert and oriented to person, place, and time.     Comments: Normal gait and station  Psychiatric:        Mood and Affect: Mood normal.        Thought Content: Thought content normal.        Judgment: Judgment normal.     Comments: Appropriate affect     Assessment/Plan: 72 year old female with epigastric  right upper quadrant pain with known gallstones on ultrasound. 1.  Would recommend HIDA scan at this point. 2.  Patient okay for liquids tonight 3.  Will follow-up and follow along with you.  If HIDA scan is negative patient may require GI  involvement for possible upper endoscopy.   Medical decision making: complex  Ralene Ok 12/19/2022, 7:54 PM

## 2022-12-19 NOTE — H&P (Signed)
History and Physical    Morgan Sparks [id number] DOB: Jul 07, 1951 DOA: 12/19/2022  PCP: Wynelle Fanny, DO  Patient coming from: Home  Chief Complaint: Abdominal pain  HPI: Morgan Sparks is a 72 y.o. female with medical history significant of sigmoid diverticulitis in 2021 with abscess which was managed conservatively, hypertension, hyperlipidemia, COPD presented to ED with acute onset severe epigastric and right upper quadrant abdominal pain.  Blood pressure elevated with systolic in the [id number] but remainder vital signs stable on arrival to the ED.  Labs showing no leukocytosis, hemoglobin 15.7, sodium 133, BUN 25, creatinine 1.2, lipase and LFTs normal, troponin negative x 2, D-dimer normal.  UA with negative nitrate, trace leukocytes, and microscopy showing 6-10 WBCs, and many bacteria.  CTA chest/abdomen/pelvis showing incidental pulmonary nodule but no acute finding. Patient received fentanyl, Dilaudid, Zofran, Zosyn, and 1 L normal saline bolus.  Right upper quadrant ultrasound showing gallbladder wall thickening with positive sonographic Murphy sign but no evidence of cholelithiasis.  Findings are equivocal for acute cholecystitis. Patient was evaluated by general surgery, recommended HIDA scan for further evaluation.  Patient okay for liquids tonight.  If HIDA scan negative, may require GI involvement for possible EGD.   Patient states she was getting ready for church in the morning when all of a sudden she felt excruciating pain in her abdomen (epigastrium and right upper quadrant).  She felt nauseous but has not vomited.  No fever.  No diarrhea or constipation.  She denies history of gallstones.  No shortness breath or chest pain.  No other complaints.  Review of Systems:  Review of Systems  All other systems reviewed and are negative.   Past Medical History:  Diagnosis Date   Diverticulitis    High cholesterol     Past Surgical History:  Procedure Laterality Date   ABDOMINAL  HYSTERECTOMY       reports that she has quit smoking. She has never used smokeless tobacco. She reports current alcohol use. No history on file for drug use.  Allergies  Allergen Reactions   Dilaudid [Hydromorphone] Itching   Doxycycline Hives   Strawberry (Diagnostic) Hives    History reviewed. No pertinent family history.  Prior to Admission medications   Medication Sig Start Date End Date Taking? Authorizing Provider  albuterol (VENTOLIN HFA) 108 (90 Base) MCG/ACT inhaler Inhale 2 puffs into the lungs daily as needed for wheezing or shortness of breath.  12/18/19  Yes [provider]  aspirin 81 MG tablet Take 81 mg by mouth 2 (two) times a week. Takes twice a week   Yes [provider]  BREZTRI AEROSPHERE 160-9-4.8 MCG/ACT AERO Take 2 puffs by mouth in the morning and at bedtime. 10/29/22  Yes [provider]  ezetimibe-simvastatin (VYTORIN) 10-20 MG tablet Take 1 tablet by mouth every evening.   Yes [provider]  fluticasone (FLONASE) 50 MCG/ACT nasal spray Place 1-2 sprays into both nostrils daily as needed for allergies. 03/07/22  Yes [provider]  hydrochlorothiazide (HYDRODIURIL) 25 MG tablet Take 25 mg by mouth every morning. 12/04/22  Yes [provider]  olmesartan (BENICAR) 20 MG tablet Take 20 mg by mouth daily.   Yes [provider]  azithromycin (ZITHROMAX) 250 MG tablet Take 1 tablet (250 mg total) by mouth daily. Take first 2 tablets together, then 1 every day until finished. Patient not taking: Reported on 12/19/2022 12/19/21   Deno Etienne, DO  lisinopril (ZESTRIL) 40 MG tablet Take 1 tablet (40 mg total) by  mouth daily. Patient not taking: Reported on 12/19/2022 03/29/20   Dwyane Dee, MD  predniSONE (DELTASONE) 20 MG tablet 2 tabs po daily x 4 days Patient not taking: Reported on 12/19/2022 08/02/22   Deno Etienne, DO    Physical Exam: Vitals:   12/19/22 1630 12/19/22 1730 12/19/22 1855 12/19/22 2015  BP:  133/76 136/86 (!) 153/90 (!) 144/80  Pulse: 81 79 73 77  Resp: 17 16 18 18   Temp:  97.8 F (36.6 C) 97.7 F (36.5 C) 97.8 F (36.6 C)  TempSrc:  Oral Oral Oral  SpO2: 92% 95%  99%  Weight:    72.1 kg  Height:    5\' 5"  (1.651 m)    Physical Exam Vitals reviewed.  Constitutional:      General: She is not in acute distress. HENT:     Head: Normocephalic and atraumatic.  Eyes:     Extraocular Movements: Extraocular movements intact.  Cardiovascular:     Rate and Rhythm: Normal rate and regular rhythm.     Pulses: Normal pulses.  Pulmonary:     Effort: Pulmonary effort is normal. No respiratory distress.     Breath sounds: Normal breath sounds. No wheezing or rales.  Abdominal:     General: Bowel sounds are normal. There is no distension.     Palpations: Abdomen is soft.     Tenderness: There is abdominal tenderness. There is guarding.     Comments: Right upper quadrant tender to palpation  Musculoskeletal:     Cervical back: Normal range of motion.     Right lower leg: No edema.     Left lower leg: No edema.  Skin:    General: Skin is warm and dry.  Neurological:     General: No focal deficit present.     Mental Status: She is alert and oriented to person, place, and time.     Labs on Admission: I have personally reviewed following labs and imaging studies  CBC: Recent Labs  Lab 12/19/22 0945  WBC 5.0  HGB 15.7*  HCT 46.7*  MCV 100.9*  PLT [id number]   Basic Metabolic Panel: Recent Labs  Lab 12/19/22 0945  NA 133*  K 3.7  CL 96*  CO2 26  GLUCOSE 95  BUN 25*  CREATININE 1.21*  CALCIUM 10.2   GFR: Estimated Creatinine Clearance: 43 mL/min (A) (by C-G formula based on SCr of 1.21 mg/dL (H)). Liver Function Tests: Recent Labs  Lab 12/19/22 0945  AST 24  ALT 26  ALKPHOS 97  BILITOT 0.8  PROT 8.3*  ALBUMIN 4.6   Recent Labs  Lab 12/19/22 0945  LIPASE 23   No results for input(s): "AMMONIA" in the last 168 hours. Coagulation Profile: No results  for input(s): "INR", "PROTIME" in the last 168 hours. Cardiac Enzymes: No results for input(s): "CKTOTAL", "CKMB", "CKMBINDEX", "TROPONINI" in the last 168 hours. BNP (last 3 results) No results for input(s): "PROBNP" in the last 8760 hours. HbA1C: No results for input(s): "HGBA1C" in the last 72 hours. CBG: No results for input(s): "GLUCAP" in the last 168 hours. Lipid Profile: No results for input(s): "CHOL", "HDL", "LDLCALC", "TRIG", "CHOLHDL", "LDLDIRECT" in the last 72 hours. Thyroid Function Tests: No results for input(s): "TSH", "T4TOTAL", "FREET4", "T3FREE", "THYROIDAB" in the last 72 hours. Anemia Panel: No results for input(s): "VITAMINB12", "FOLATE", "FERRITIN", "TIBC", "IRON", "RETICCTPCT" in the last 72 hours. Urine analysis:    Component Value Date/Time   COLORURINE YELLOW 12/19/2022 [place] 12/19/2022  1002   LABSPEC 1.015 12/19/2022 1002   PHURINE 5.5 12/19/2022 1002   GLUCOSEU NEGATIVE 12/19/2022 1002   HGBUR NEGATIVE 12/19/2022 [place] 12/19/2022 1002   [place] 12/19/2022 1002   PROTEINUR NEGATIVE 12/19/2022 1002   NITRITE NEGATIVE 12/19/2022 1002   LEUKOCYTESUR TRACE (A) 12/19/2022 1002    Radiological Exams on Admission: CT Angio Chest/Abd/Pel for Dissection W and/or Wo Contrast  Result Date: 12/19/2022 CLINICAL DATA:  Pain and cramping under the right breast with concern for acute aortic syndrome EXAM: CT ANGIOGRAPHY CHEST, ABDOMEN AND PELVIS TECHNIQUE: Non-contrast CT of the chest was initially obtained. Multidetector CT imaging through the chest, abdomen and pelvis was performed using the standard protocol during bolus administration of intravenous contrast. Multiplanar reconstructed images and MIPs were obtained and reviewed to evaluate the vascular anatomy. RADIATION DOSE REDUCTION: This exam was performed according to the departmental dose-optimization program which includes automated exposure control,  adjustment of the mA and/or kV according to patient size and/or use of iterative reconstruction technique. CONTRAST:  92mL OMNIPAQUE IOHEXOL 350 MG/ML SOLN COMPARISON:  CT abdomen and pelvis dated 03/28/2020 FINDINGS: CTA CHEST FINDINGS Cardiovascular: Preferential opacification of the thoracic aorta. No evidence of thoracic aortic aneurysm or dissection. Normal heart size. No pericardial effusion. Coronary artery calcifications. No central pulmonary emboli. Moderate narrowing of the left subclavian artery origin due to atherosclerotic plaque. Mediastinum/Nodes: Imaged thyroid gland without nodules meeting criteria for imaging follow-up by size. Normal esophagus. No pathologically enlarged axillary, supraclavicular, mediastinal, or hilar lymph nodes. Lungs/Pleura: The central airways are patent. Layering secretions within the trachea. No focal consolidation. Lateral right apical nodule measures 4 x 3 mm (6:9 no pneumothorax. No pleural effusion. Musculoskeletal: No acute or abnormal lytic or blastic osseous lesions. Review of the MIP images confirms the above findings. CTA ABDOMEN AND PELVIS FINDINGS VASCULAR Aorta: Aortic atherosclerosis. Normal caliber aorta without aneurysm, dissection, vasculitis or significant stenosis. Celiac: Patent without evidence of aneurysm, dissection, vasculitis or significant stenosis. SMA: Patent without evidence of aneurysm, dissection, vasculitis or significant stenosis. Renals: Single renal arteries. Moderate narrowing of the right renal artery origin and mild narrowing of the left renal artery origin. No evidence of aneurysm, dissection, vasculitis, or fibromuscular dysplasia. IMA: Patent without evidence of aneurysm, dissection, vasculitis or significant stenosis. Inflow: Segmental mild-to-moderate narrowing due to atherosclerotic plaque. No evidence of aneurysm, dissection, or vasculitis. Proximal Outflow: Bilateral common femoral and visualized portions of the superficial and  profunda femoral arteries are patent without evidence of aneurysm, dissection, vasculitis or significant stenosis. Veins: No obvious venous abnormality within the limitations of this arterial phase study. Review of the MIP images confirms the above findings. NON-VASCULAR Hepatobiliary: No focal hepatic lesions. No intra or extrahepatic biliary ductal dilation. Normal gallbladder. Pancreas: No focal lesions or main ductal dilation. Spleen: Normal in size without focal abnormality. Adrenals/Urinary Tract: No adrenal nodules. No suspicious renal mass, calculi or hydronephrosis. No focal bladder wall thickening. Stomach/Bowel: Normal appearance of the stomach. No evidence of bowel wall thickening, distention, or inflammatory changes. Colonic diverticulosis without acute diverticulitis. Normal appendix. Lymphatic: No enlarged abdominal or pelvic lymph nodes. Reproductive: No adnexal masses. Other: No free fluid, fluid collection, or free air. Musculoskeletal: No acute or abnormal lytic or blastic osseous lesions. Review of the MIP images confirms the above findings. IMPRESSION: 1. No evidence of acute aortic syndrome. 2. No acute abnormality in the chest, abdomen, or pelvis. 3. Extensive calcified and noncalcified atherosclerotic plaque. Aortic Atherosclerosis (ICD10-I70.0). Coronary artery calcifications. Assessment for  potential risk factor modification, dietary therapy or pharmacologic therapy may be warranted, if clinically indicated. 4. Right apical 4 x 3 mm pulmonary nodule. No follow-up needed if patient is low-risk.This recommendation follows the consensus statement: Guidelines for Management of Incidental Pulmonary Nodules Detected on CT Images: From the Fleischner Society 2017; Radiology 2017; 284:228-243. Electronically Signed   By: Darrin Nipper M.D.   On: 12/19/2022 13:21   US Abdomen Limited RUQ (LIVER/GB)  Result Date: 12/19/2022 CLINICAL DATA:  Right upper quadrant pain. EXAM: ULTRASOUND ABDOMEN LIMITED  RIGHT UPPER QUADRANT COMPARISON:  None Available. FINDINGS: Gallbladder: Gallbladder wall thickening measuring up to 5 mm. Per the performing sonographer, there was a positive sonographic Murphy sign. There is no evidence of cholelithiasis. Common bile duct: Diameter: 2 mm, which is normal Liver: No focal lesion identified. Within normal limits in parenchymal echogenicity. Portal vein is patent on color Doppler imaging with normal direction of blood flow towards the liver. Other: None. IMPRESSION: Gallbladder wall thickening with positive sonographic Murphy sign. No evidence of cholelithiasis. Findings are equivocal for acute cholecystitis. Electronically Signed   By: Marin Roberts M.D.   On: 12/19/2022 11:59    EKG: Independently reviewed.  Sinus rhythm, QTc 513, no acute ischemic changes.  Assessment and Plan  Severe epigastric and right upper quadrant abdominal pain ?Acute cholecystitis Normal lipase and LFTs.  No fever or leukocytosis.  Right upper quadrant ultrasound showing gallbladder wall thickening with positive sonographic Murphy sign but no evidence of cholelithiasis.  Findings are equivocal for acute cholecystitis.  Seen by general surgery, HIDA scan pending.  Okay for liquids tonight per general surgery.  If HIDA scan negative, may require GI involvement for possible EGD.  Will continue Zosyn at this time.  Continue pain management.  Monitor CBC and CMP.  AKI versus progression of CKD Creatinine 1.2, previously 0.7-0.8 in 2021.  No recent labs for comparison.  Continue IV fluid hydration and monitor renal function.  Avoid nephrotoxic agents/hold home hydrochlorothiazide, olmesartan, and lisinopril at this time.  Mild hyponatremia Continue IV fluid hydration and monitor labs.  Asymptomatic bacteriuria UA with negative nitrate, trace leukocytes, and microscopy showing 6-10 WBCs, and many bacteria.  Patient is not endorsing any urinary symptoms.  No indication for treatment at this time  although already on Zosyn due to concern for possible acute cholecystitis.  Hypertension Blood pressure improved after pain management.  IV hydralazine PRN.  Hold hydrochlorothiazide, olmesartan, and lisinopril at this time given concern for AKI.  Hyperlipidemia Continue ezetimibe-simvastatin.  Incidental pulmonary nodule CT showing right apical 4 x 3 mm pulmonary nodule.  She is a former smoker.  Will need close outpatient follow-up for monitoring.  QT prolongation QTc 513, no prior EKG for comparison.  Monitor potassium and magnesium levels, replace if low.  Avoid QT prolonging drugs and repeat EKG in a.m.  COPD Stable, no signs of acute exacerbation.  Continue Breztri, albuterol as needed.  DVT prophylaxis: SCDs Code Status: Full Code (discussed with the patient) Family Communication: No family available at this time. Consults called: General surgery Level of care: Telemetry bed Admission status: It is my clinical opinion that admission to INPATIENT is reasonable and necessary because of the expectation that this patient will require hospital care that crosses at least 2 midnights to treat this condition based on the medical complexity of the problems presented.  Given the aforementioned information, the predictability of an adverse outcome is felt to be significant.   Shela Leff MD [facility]  If  7PM-7AM, please contact night-coverage [email or web address]  12/19/2022, 11:55 PM

## 2022-12-19 NOTE — ED Provider Notes (Signed)
[facility] Provider Note   CSN: [id number] Arrival date & time: 12/19/22  [id number]     History  Chief Complaint  Patient presents with   Abdominal Pain    Morgan Sparks is a 72 y.o. female.  Patient with a history of hypertension, high cholesterol presenting with upper abdominal pain onset this morning while she was getting ready for church.  States she developed cramping to her upper abdomen but has since localized to her right upper quadrant.  She is concerned about her gallbladder.  Has had this pain in the past without clear diagnosis.  Pain is severe to her right upper quadrant today associate with nausea but no vomiting.  She denies chest pain.  She denies any lower abdominal pain, constipation or diarrhea.  No pain with urination or blood in the urine.  She reports no history of ulcers or acid reflux.  The pain is sharp and crampy and seems to be localized to her right upper quadrant.  She has no history of CAD or CHF.  The history is provided by the patient.  Abdominal Pain Associated symptoms: nausea   Associated symptoms: no cough, no dysuria, no fever, no shortness of breath and no vomiting        Home Medications Prior to Admission medications   Medication Sig Start Date End Date Taking? Authorizing Provider  albuterol (VENTOLIN HFA) 108 (90 Base) MCG/ACT inhaler Inhale 2 puffs into the lungs daily as needed for wheezing or shortness of breath.  12/18/19   [provider]  aspirin 81 MG tablet Take 81 mg by mouth See admin instructions. Takes twice a week    [provider]  azithromycin (ZITHROMAX) 250 MG tablet Take 1 tablet (250 mg total) by mouth daily. Take first 2 tablets together, then 1 every day until finished. 12/19/21   Deno Etienne, DO  ezetimibe-simvastatin (VYTORIN) 10-20 MG tablet Take 1 tablet by mouth daily.    [provider]  lisinopril (ZESTRIL) 40 MG tablet Take 1 tablet (40 mg total)  by mouth daily. 03/29/20   Dwyane Dee, MD  predniSONE (DELTASONE) 20 MG tablet 2 tabs po daily x 4 days 08/02/22   Deno Etienne, DO  sodium chloride (OCEAN) 0.65 % SOLN nasal spray Place 1 spray into both nostrils as needed for congestion.    [provider]      Allergies    Doxycycline and Strawberry (diagnostic)    Review of Systems   Review of Systems  Constitutional:  Negative for appetite change and fever.  HENT:  Negative for congestion and rhinorrhea.   Respiratory:  Negative for cough, chest tightness and shortness of breath.   Gastrointestinal:  Positive for abdominal pain and nausea. Negative for vomiting.  Genitourinary:  Negative for dysuria.  Musculoskeletal:  Negative for arthralgias and myalgias.  Skin:  Negative for rash.  Neurological:  Negative for dizziness, weakness and headaches.    all other systems are negative except as noted in the HPI and PMH.   Physical Exam Updated Vital Signs BP (!) 167/99 (BP Location: Right Arm)   Pulse 92   Temp 98 F (36.7 C)   Resp 17   SpO2 100%  Physical Exam Vitals and nursing note reviewed.  Constitutional:      General: She is not in acute distress.    Appearance: She is well-developed.  HENT:     Head: Normocephalic and atraumatic.     Mouth/Throat:  Pharynx: No oropharyngeal exudate.  Eyes:     Conjunctiva/sclera: Conjunctivae normal.     Pupils: Pupils are equal, round, and reactive to light.  Neck:     Comments: No meningismus. Cardiovascular:     Rate and Rhythm: Normal rate and regular rhythm.     Heart sounds: Normal heart sounds. No murmur heard. Pulmonary:     Effort: Pulmonary effort is normal. No respiratory distress.     Breath sounds: Normal breath sounds.  Abdominal:     Palpations: Abdomen is soft.     Tenderness: There is abdominal tenderness. There is guarding. There is no rebound.     Comments: Epigastric and RUQ tenderness with guarding  Musculoskeletal:        General: No  tenderness. Normal range of motion.     Cervical back: Normal range of motion and neck supple.  Skin:    General: Skin is warm.  Neurological:     Mental Status: She is alert and oriented to person, place, and time.     Cranial Nerves: No cranial nerve deficit.     Motor: No abnormal muscle tone.     Coordination: Coordination normal.     Comments:  5/5 strength throughout. CN 2-12 intact.Equal grip strength.   Psychiatric:        Behavior: Behavior normal.     ED Results / Procedures / Treatments   Labs (all labs ordered are listed, but only abnormal results are displayed) Labs Reviewed  COMPREHENSIVE METABOLIC PANEL - Abnormal; Notable for the following components:      Result Value   Sodium 133 (*)    Chloride 96 (*)    BUN 25 (*)    Creatinine, Ser 1.21 (*)    Total Protein 8.3 (*)    GFR, Estimated 48 (*)    All other components within normal limits  CBC - Abnormal; Notable for the following components:   Hemoglobin 15.7 (*)    HCT 46.7 (*)    MCV 100.9 (*)    All other components within normal limits  URINALYSIS, ROUTINE W REFLEX MICROSCOPIC - Abnormal; Notable for the following components:   Leukocytes,Ua TRACE (*)    All other components within normal limits  URINALYSIS, MICROSCOPIC (REFLEX) - Abnormal; Notable for the following components:   Bacteria, UA MANY (*)    All other components within normal limits  LIPASE, BLOOD  D-DIMER, QUANTITATIVE  TROPONIN I (HIGH SENSITIVITY)  TROPONIN I (HIGH SENSITIVITY)    EKG EKG Interpretation  Date/Time:  Sunday December 19 2022 09:44:16 EDT Ventricular Rate:  83 PR Interval:  174 QRS Duration: 84 QT Interval:  436 QTC Calculation: 513 R Axis:   33 Text Interpretation: Sinus rhythm Prolonged QT interval No previous ECGs available Confirmed by Ezequiel Essex [id number]) on 12/19/2022 9:58:30 AM  Radiology CT Angio Chest/Abd/Pel for Dissection W and/or Wo Contrast  Result Date: 12/19/2022 CLINICAL DATA:  Pain and  cramping under the right breast with concern for acute aortic syndrome EXAM: CT ANGIOGRAPHY CHEST, ABDOMEN AND PELVIS TECHNIQUE: Non-contrast CT of the chest was initially obtained. Multidetector CT imaging through the chest, abdomen and pelvis was performed using the standard protocol during bolus administration of intravenous contrast. Multiplanar reconstructed images and MIPs were obtained and reviewed to evaluate the vascular anatomy. RADIATION DOSE REDUCTION: This exam was performed according to the departmental dose-optimization program which includes automated exposure control, adjustment of the mA and/or kV according to patient size and/or use of iterative reconstruction technique. CONTRAST:  54mL OMNIPAQUE IOHEXOL 350 MG/ML SOLN COMPARISON:  CT abdomen and pelvis dated 03/28/2020 FINDINGS: CTA CHEST FINDINGS Cardiovascular: Preferential opacification of the thoracic aorta. No evidence of thoracic aortic aneurysm or dissection. Normal heart size. No pericardial effusion. Coronary artery calcifications. No central pulmonary emboli. Moderate narrowing of the left subclavian artery origin due to atherosclerotic plaque. Mediastinum/Nodes: Imaged thyroid gland without nodules meeting criteria for imaging follow-up by size. Normal esophagus. No pathologically enlarged axillary, supraclavicular, mediastinal, or hilar lymph nodes. Lungs/Pleura: The central airways are patent. Layering secretions within the trachea. No focal consolidation. Lateral right apical nodule measures 4 x 3 mm (6:9 no pneumothorax. No pleural effusion. Musculoskeletal: No acute or abnormal lytic or blastic osseous lesions. Review of the MIP images confirms the above findings. CTA ABDOMEN AND PELVIS FINDINGS VASCULAR Aorta: Aortic atherosclerosis. Normal caliber aorta without aneurysm, dissection, vasculitis or significant stenosis. Celiac: Patent without evidence of aneurysm, dissection, vasculitis or significant stenosis. SMA: Patent without  evidence of aneurysm, dissection, vasculitis or significant stenosis. Renals: Single renal arteries. Moderate narrowing of the right renal artery origin and mild narrowing of the left renal artery origin. No evidence of aneurysm, dissection, vasculitis, or fibromuscular dysplasia. IMA: Patent without evidence of aneurysm, dissection, vasculitis or significant stenosis. Inflow: Segmental mild-to-moderate narrowing due to atherosclerotic plaque. No evidence of aneurysm, dissection, or vasculitis. Proximal Outflow: Bilateral common femoral and visualized portions of the superficial and profunda femoral arteries are patent without evidence of aneurysm, dissection, vasculitis or significant stenosis. Veins: No obvious venous abnormality within the limitations of this arterial phase study. Review of the MIP images confirms the above findings. NON-VASCULAR Hepatobiliary: No focal hepatic lesions. No intra or extrahepatic biliary ductal dilation. Normal gallbladder. Pancreas: No focal lesions or main ductal dilation. Spleen: Normal in size without focal abnormality. Adrenals/Urinary Tract: No adrenal nodules. No suspicious renal mass, calculi or hydronephrosis. No focal bladder wall thickening. Stomach/Bowel: Normal appearance of the stomach. No evidence of bowel wall thickening, distention, or inflammatory changes. Colonic diverticulosis without acute diverticulitis. Normal appendix. Lymphatic: No enlarged abdominal or pelvic lymph nodes. Reproductive: No adnexal masses. Other: No free fluid, fluid collection, or free air. Musculoskeletal: No acute or abnormal lytic or blastic osseous lesions. Review of the MIP images confirms the above findings. IMPRESSION: 1. No evidence of acute aortic syndrome. 2. No acute abnormality in the chest, abdomen, or pelvis. 3. Extensive calcified and noncalcified atherosclerotic plaque. Aortic Atherosclerosis (ICD10-I70.0). Coronary artery calcifications. Assessment for potential risk factor  modification, dietary therapy or pharmacologic therapy may be warranted, if clinically indicated. 4. Right apical 4 x 3 mm pulmonary nodule. No follow-up needed if patient is low-risk.This recommendation follows the consensus statement: Guidelines for Management of Incidental Pulmonary Nodules Detected on CT Images: From the Fleischner Society 2017; Radiology 2017; 284:228-243. Electronically Signed   By: Darrin Nipper M.D.   On: 12/19/2022 13:21   US Abdomen Limited RUQ (LIVER/GB)  Result Date: 12/19/2022 CLINICAL DATA:  Right upper quadrant pain. EXAM: ULTRASOUND ABDOMEN LIMITED RIGHT UPPER QUADRANT COMPARISON:  None Available. FINDINGS: Gallbladder: Gallbladder wall thickening measuring up to 5 mm. Per the performing sonographer, there was a positive sonographic Murphy sign. There is no evidence of cholelithiasis. Common bile duct: Diameter: 2 mm, which is normal Liver: No focal lesion identified. Within normal limits in parenchymal echogenicity. Portal vein is patent on color Doppler imaging with normal direction of blood flow towards the liver. Other: None. IMPRESSION: Gallbladder wall thickening with positive sonographic Murphy sign. No evidence of cholelithiasis. Findings are  equivocal for acute cholecystitis. Electronically Signed   By: Marin Roberts M.D.   On: 12/19/2022 11:59    Procedures Procedures    Medications Ordered in ED Medications - No data to display  ED Course/ Medical Decision Making/ A&P                             Medical Decision Making Amount and/or Complexity of Data Reviewed Labs: ordered. Decision-making details documented in ED Course. Radiology: ordered and independent interpretation performed. Decision-making details documented in ED Course. ECG/medicine tests: ordered and independent interpretation performed. Decision-making details documented in ED Course.  Risk Prescription drug management. Decision regarding hospitalization.  Right upper quadrant pain and  epigastric pain that onset this morning while getting ready for church.  EKG without acute ischemia.  Patient given IV fluids, pain and nausea medications.  LFTs and lipase are normal.  No significant leukocytosis.  Troponin negative.  Presentation concerning for cholecystitis.  Patient given IV fluids, pain and nausea medication.  Labs are reassuring.  EKG was nonischemic.  Troponin is negative with low suspicion for ACS.  D-dimer negative with low suspicion for pulmonary embolism.  Ultrasound shows thickened gallbladder wall with positive Murphy sign however no gallstones.  Discussed with Dr. Rosendo Gros of general surgery.  He recommends CT scan and discussion after the results.  Antibiotics initiated.  CT scan shows no additional acute pathology.  Gallbladder appears normal.  No evidence of aortic aneurysm or dissection. CT also shows some vascular calcifications and pulmonary nodule.  Discussed with Dr. Rosendo Gros.  Agrees with medical admission for possible HIDA scan.  He will consult.  Pain has improved.  Admission discussed with Dr. Verlon Au to [facility].  He recommends n.p.o., continuous IV fluids.  HIDA scan not available at this facility.          Final Clinical Impression(s) / ED Diagnoses Final diagnoses:  RUQ pain    Rx / DC Orders ED Discharge Orders     None         Nixie Laube, Annie Main, MD 12/19/22 1414

## 2022-12-19 NOTE — Progress Notes (Signed)
Patient called out and stated that she was itching after receiving a PRN dose of dilaudid. Patient stated that she noticed that she was itching in the ED after receiving this medication, but did not realize what caused her to itch. She denies SOB, palpitations, and nausea. No hives, swelling, or rash noted. Dilaudid added to list of allergies.

## 2022-12-19 NOTE — H&P (Incomplete)
History and Physical    Morgan Sparks [id number] DOB: 1951/02/29 DOA: 12/19/2022  PCP: Wynelle Fanny, DO  Patient coming from: {Blank single:19197::"Home","SNF","ALF/ILF","Group Home","BHH","CIR","Hospice","Homeless","[facility] ED","[facility] ED","Outside Hospital","***"}  Chief Complaint: ***  HPI: Morgan Sparks is a 72 y.o. female with medical history significant of sigmoid diverticulitis in 2021 with abscess which was managed conservatively, hypertension, hyperlipidemia presented to ED with acute onset severe epigastric and right upper quadrant abdominal pain.  Blood pressure elevated with systolic in the [id number] but remainder vital signs stable on arrival to the ED.  Patient received fentanyl, Dilaudid, Zofran, Zosyn, and 1 L normal saline bolus.  Patient was evaluated by general surgery, recommended HIDA scan for further evaluation.  Patient okay for liquids tonight if HIDA scan negative, may require GI involvement for possible EGD.    Found to have an AKI on evaluation versus progression of chronic kidney disease  Imaging CT angiogram chest showed 3X 4 mm pulmonary nodule requiring outpatient close follow-up  US ABD = gallbladder thickening + Murphy sign no cholelithiasis?  Acute cholecystitis    LFTs are normal   surgery Dr. Rosendo Gros consulted   Patient given Zosyn, fluid bolus 1000 cc kept n.p.o. given fentanyl Dilaudid  Case discussed with Dr. Wyvonnia Dusky of the emergency room-HIDA pending    ED Course: ***  Review of Systems:  ROS  Past Medical History:  Diagnosis Date  . Diverticulitis   . High cholesterol     Past Surgical History:  Procedure Laterality Date  . ABDOMINAL HYSTERECTOMY       reports that she has quit smoking. She has never used smokeless tobacco. She reports current alcohol use. No history on file for drug use.  Allergies  Allergen Reactions  . Dilaudid [Hydromorphone] Itching  . Doxycycline Hives  . Strawberry (Diagnostic) Hives    History reviewed. No  pertinent family history.  Prior to Admission medications   Medication Sig Start Date End Date Taking? Authorizing Provider  albuterol (VENTOLIN HFA) 108 (90 Base) MCG/ACT inhaler Inhale 2 puffs into the lungs daily as needed for wheezing or shortness of breath.  12/18/19  Yes [provider]  aspirin 81 MG tablet Take 81 mg by mouth 2 (two) times a week. Takes twice a week   Yes [provider]  BREZTRI AEROSPHERE 160-9-4.8 MCG/ACT AERO Take 2 puffs by mouth in the morning and at bedtime. 10/29/22  Yes [provider]  ezetimibe-simvastatin (VYTORIN) 10-20 MG tablet Take 1 tablet by mouth every evening.   Yes [provider]  fluticasone (FLONASE) 50 MCG/ACT nasal spray Place 1-2 sprays into both nostrils daily as needed for allergies. 03/07/22  Yes [provider]  hydrochlorothiazide (HYDRODIURIL) 25 MG tablet Take 25 mg by mouth every morning. 12/04/22  Yes [provider]  olmesartan (BENICAR) 20 MG tablet Take 20 mg by mouth daily.   Yes [provider]  azithromycin (ZITHROMAX) 250 MG tablet Take 1 tablet (250 mg total) by mouth daily. Take first 2 tablets together, then 1 every day until finished. Patient not taking: Reported on 12/19/2022 12/19/21   Morgan Etienne, DO  lisinopril (ZESTRIL) 40 MG tablet Take 1 tablet (40 mg total) by mouth daily. Patient not taking: Reported on 12/19/2022 03/29/20   Morgan Dee, MD  predniSONE (DELTASONE) 20 MG tablet 2 tabs po daily x 4 days Patient not taking: Reported on 12/19/2022 08/02/22   Morgan Etienne, DO    Physical Exam: Vitals:   12/19/22 1630 12/19/22 1730 12/19/22 1855 12/19/22 2015  BP:  133/76 136/86 (!) 153/90 (!) 144/80  Pulse: 81 79 73 77  Resp: 17 16 18 18   Temp:  97.8 F (36.6 C) 97.7 F (36.5 C) 97.8 F (36.6 C)  TempSrc:  Oral Oral Oral  SpO2: 92% 95%  99%  Weight:    72.1 kg  Height:    5\' 5"  (1.651 m)    Physical Exam  Labs on Admission: I have personally reviewed  following labs and imaging studies  CBC: Recent Labs  Lab 12/19/22 0945  WBC 5.0  HGB 15.7*  HCT 46.7*  MCV 100.9*  PLT [id number]   Basic Metabolic Panel: Recent Labs  Lab 12/19/22 0945  NA 133*  K 3.7  CL 96*  CO2 26  GLUCOSE 95  BUN 25*  CREATININE 1.21*  CALCIUM 10.2   GFR: Estimated Creatinine Clearance: 43 mL/min (A) (by C-G formula based on SCr of 1.21 mg/dL (H)). Liver Function Tests: Recent Labs  Lab 12/19/22 0945  AST 24  ALT 26  ALKPHOS 97  BILITOT 0.8  PROT 8.3*  ALBUMIN 4.6   Recent Labs  Lab 12/19/22 0945  LIPASE 23   No results for input(s): "AMMONIA" in the last 168 hours. Coagulation Profile: No results for input(s): "INR", "PROTIME" in the last 168 hours. Cardiac Enzymes: No results for input(s): "CKTOTAL", "CKMB", "CKMBINDEX", "TROPONINI" in the last 168 hours. BNP (last 3 results) No results for input(s): "PROBNP" in the last 8760 hours. HbA1C: No results for input(s): "HGBA1C" in the last 72 hours. CBG: No results for input(s): "GLUCAP" in the last 168 hours. Lipid Profile: No results for input(s): "CHOL", "HDL", "LDLCALC", "TRIG", "CHOLHDL", "LDLDIRECT" in the last 72 hours. Thyroid Function Tests: No results for input(s): "TSH", "T4TOTAL", "FREET4", "T3FREE", "THYROIDAB" in the last 72 hours. Anemia Panel: No results for input(s): "VITAMINB12", "FOLATE", "FERRITIN", "TIBC", "IRON", "RETICCTPCT" in the last 72 hours. Urine analysis:    Component Value Date/Time   COLORURINE YELLOW 12/19/2022 1002   APPEARANCEUR CLEAR 12/19/2022 1002   LABSPEC 1.015 12/19/2022 1002   PHURINE 5.5 12/19/2022 1002   GLUCOSEU NEGATIVE 12/19/2022 1002   HGBUR NEGATIVE 12/19/2022 1002   BILIRUBINUR NEGATIVE 12/19/2022 1002   KETONESUR NEGATIVE 12/19/2022 1002   PROTEINUR NEGATIVE 12/19/2022 1002   NITRITE NEGATIVE 12/19/2022 1002   LEUKOCYTESUR TRACE (A) 12/19/2022 1002    Radiological Exams on Admission: CT Angio Chest/Abd/Pel for Dissection W  and/or Wo Contrast  Result Date: 12/19/2022 CLINICAL DATA:  Pain and cramping under the right breast with concern for acute aortic syndrome EXAM: CT ANGIOGRAPHY CHEST, ABDOMEN AND PELVIS TECHNIQUE: Non-contrast CT of the chest was initially obtained. Multidetector CT imaging through the chest, abdomen and pelvis was performed using the standard protocol during bolus administration of intravenous contrast. Multiplanar reconstructed images and MIPs were obtained and reviewed to evaluate the vascular anatomy. RADIATION DOSE REDUCTION: This exam was performed according to the departmental dose-optimization program which includes automated exposure control, adjustment of the mA and/or kV according to patient size and/or use of iterative reconstruction technique. CONTRAST:  58mL OMNIPAQUE IOHEXOL 350 MG/ML SOLN COMPARISON:  CT abdomen and pelvis dated 03/28/2020 FINDINGS: CTA CHEST FINDINGS Cardiovascular: Preferential opacification of the thoracic aorta. No evidence of thoracic aortic aneurysm or dissection. Normal heart size. No pericardial effusion. Coronary artery calcifications. No central pulmonary emboli. Moderate narrowing of the left subclavian artery origin due to atherosclerotic plaque. Mediastinum/Nodes: Imaged thyroid gland without nodules meeting criteria for imaging follow-up by size. Normal esophagus. No pathologically enlarged axillary, supraclavicular, mediastinal,  or hilar lymph nodes. Lungs/Pleura: The central airways are patent. Layering secretions within the trachea. No focal consolidation. Lateral right apical nodule measures 4 x 3 mm (6:9 no pneumothorax. No pleural effusion. Musculoskeletal: No acute or abnormal lytic or blastic osseous lesions. Review of the MIP images confirms the above findings. CTA ABDOMEN AND PELVIS FINDINGS VASCULAR Aorta: Aortic atherosclerosis. Normal caliber aorta without aneurysm, dissection, vasculitis or significant stenosis. Celiac: Patent without evidence of  aneurysm, dissection, vasculitis or significant stenosis. SMA: Patent without evidence of aneurysm, dissection, vasculitis or significant stenosis. Renals: Single renal arteries. Moderate narrowing of the right renal artery origin and mild narrowing of the left renal artery origin. No evidence of aneurysm, dissection, vasculitis, or fibromuscular dysplasia. IMA: Patent without evidence of aneurysm, dissection, vasculitis or significant stenosis. Inflow: Segmental mild-to-moderate narrowing due to atherosclerotic plaque. No evidence of aneurysm, dissection, or vasculitis. Proximal Outflow: Bilateral common femoral and visualized portions of the superficial and profunda femoral arteries are patent without evidence of aneurysm, dissection, vasculitis or significant stenosis. Veins: No obvious venous abnormality within the limitations of this arterial phase study. Review of the MIP images confirms the above findings. NON-VASCULAR Hepatobiliary: No focal hepatic lesions. No intra or extrahepatic biliary ductal dilation. Normal gallbladder. Pancreas: No focal lesions or main ductal dilation. Spleen: Normal in size without focal abnormality. Adrenals/Urinary Tract: No adrenal nodules. No suspicious renal mass, calculi or hydronephrosis. No focal bladder wall thickening. Stomach/Bowel: Normal appearance of the stomach. No evidence of bowel wall thickening, distention, or inflammatory changes. Colonic diverticulosis without acute diverticulitis. Normal appendix. Lymphatic: No enlarged abdominal or pelvic lymph nodes. Reproductive: No adnexal masses. Other: No free fluid, fluid collection, or free air. Musculoskeletal: No acute or abnormal lytic or blastic osseous lesions. Review of the MIP images confirms the above findings. IMPRESSION: 1. No evidence of acute aortic syndrome. 2. No acute abnormality in the chest, abdomen, or pelvis. 3. Extensive calcified and noncalcified atherosclerotic plaque. Aortic Atherosclerosis  (ICD10-I70.0). Coronary artery calcifications. Assessment for potential risk factor modification, dietary therapy or pharmacologic therapy may be warranted, if clinically indicated. 4. Right apical 4 x 3 mm pulmonary nodule. No follow-up needed if patient is low-risk.This recommendation follows the consensus statement: Guidelines for Management of Incidental Pulmonary Nodules Detected on CT Images: From the Fleischner Society 2017; Radiology 2017; 284:228-243. Electronically Signed   By: Darrin Nipper M.D.   On: 12/19/2022 13:21   US Abdomen Limited RUQ (LIVER/GB)  Result Date: 12/19/2022 CLINICAL DATA:  Right upper quadrant pain. EXAM: ULTRASOUND ABDOMEN LIMITED RIGHT UPPER QUADRANT COMPARISON:  None Available. FINDINGS: Gallbladder: Gallbladder wall thickening measuring up to 5 mm. Per the performing sonographer, there was a positive sonographic Murphy sign. There is no evidence of cholelithiasis. Common bile duct: Diameter: 2 mm, which is normal Liver: No focal lesion identified. Within normal limits in parenchymal echogenicity. Portal vein is patent on color Doppler imaging with normal direction of blood flow towards the liver. Other: None. IMPRESSION: Gallbladder wall thickening with positive sonographic Murphy sign. No evidence of cholelithiasis. Findings are equivocal for acute cholecystitis. Electronically Signed   By: Marin Roberts M.D.   On: 12/19/2022 11:59    EKG: Independently reviewed. ***  Assessment and Plan  DVT prophylaxis: {Blank single:19197::"Lovenox","SQ Heparin","IV heparin gtt","Xarelto","Eliquis","Coumadin","SCDs","***"} Code Status: {Blank single:19197::"Full Code","DNR","DNR/DNI","Comfort Care","***"} Family Communication: ***  Consults called: ***  Level of care: {Blank single:19197::"Med-Surg","Telemetry bed","Progressive Care Unit","Step Down Unit"} Admission status: *** Time Spent: 75+ minutes***  Shela Leff MD [facility]  If 7PM-7AM, please contact  night-coverage [email or web address]  12/19/2022, 11:55 PM

## 2022-12-20 ENCOUNTER — Inpatient Hospital Stay (HOSPITAL_COMMUNITY): Payer: Medicare Other

## 2022-12-20 DIAGNOSIS — N179 Acute kidney failure, unspecified: Secondary | ICD-10-CM

## 2022-12-20 DIAGNOSIS — R8271 Bacteriuria: Secondary | ICD-10-CM | POA: Diagnosis present

## 2022-12-20 DIAGNOSIS — K810 Acute cholecystitis: Secondary | ICD-10-CM | POA: Diagnosis present

## 2022-12-20 DIAGNOSIS — I10 Essential (primary) hypertension: Secondary | ICD-10-CM | POA: Diagnosis present

## 2022-12-20 DIAGNOSIS — R911 Solitary pulmonary nodule: Secondary | ICD-10-CM

## 2022-12-20 DIAGNOSIS — R9431 Abnormal electrocardiogram [ECG] [EKG]: Secondary | ICD-10-CM

## 2022-12-20 DIAGNOSIS — E871 Hypo-osmolality and hyponatremia: Secondary | ICD-10-CM | POA: Diagnosis not present

## 2022-12-20 LAB — COMPREHENSIVE METABOLIC PANEL
ALT: 20 U/L (ref 0–44)
AST: 18 U/L (ref 15–41)
Albumin: 3.7 g/dL (ref 3.5–5.0)
Alkaline Phosphatase: 67 U/L (ref 38–126)
Anion gap: 8 (ref 5–15)
BUN: 20 mg/dL (ref 8–23)
CO2: 23 mmol/L (ref 22–32)
Calcium: 8.8 mg/dL — ABNORMAL LOW (ref 8.9–10.3)
Chloride: 106 mmol/L (ref 98–111)
Creatinine, Ser: 1.11 mg/dL — ABNORMAL HIGH (ref 0.44–1.00)
GFR, Estimated: 53 mL/min — ABNORMAL LOW (ref >60)
Glucose, Bld: 88 mg/dL (ref 70–99)
Potassium: 3.7 mmol/L (ref 3.5–5.1)
Sodium: 137 mmol/L (ref 135–145)
Total Bilirubin: 0.8 mg/dL (ref 0.3–1.2)
Total Protein: 6.2 g/dL — ABNORMAL LOW (ref 6.5–8.1)

## 2022-12-20 LAB — HIV ANTIBODY (ROUTINE TESTING W REFLEX): HIV Screen 4th Generation wRfx: NONREACTIVE

## 2022-12-20 LAB — CBC
HCT: 38.8 % (ref 36.0–46.0)
Hemoglobin: 12.4 g/dL (ref 12.0–15.0)
MCH: 33.3 pg (ref 26.0–34.0)
MCHC: 32 g/dL (ref 30.0–36.0)
MCV: 104.3 fL — ABNORMAL HIGH (ref 80.0–100.0)
Platelets: 193 10*3/uL (ref 150–400)
RBC: 3.72 MIL/uL — ABNORMAL LOW (ref 3.87–5.11)
RDW: 13.4 % (ref 11.5–15.5)
WBC: 4.1 10*3/uL (ref 4.0–10.5)
nRBC: 0 % (ref 0.0–0.2)

## 2022-12-20 LAB — MAGNESIUM: Magnesium: 1.7 mg/dL (ref 1.7–2.4)

## 2022-12-20 MED ORDER — FLUTICASONE PROPIONATE 50 MCG/ACT NA SUSP: 1.0000 | Freq: Every day | NASAL | Status: DC | PRN

## 2022-12-20 MED ORDER — EZETIMIBE-SIMVASTATIN 10-20 MG PO TABS: 1.0000 | ORAL_TABLET | Freq: Every evening | ORAL | Status: DC

## 2022-12-20 MED ORDER — ALBUTEROL SULFATE HFA 108 (90 BASE) MCG/ACT IN AERS: 1.0000 | INHALATION_SPRAY | Freq: Four times a day (QID) | RESPIRATORY_TRACT | Status: DC | PRN

## 2022-12-20 MED ORDER — EZETIMIBE 10 MG PO TABS
10.0000 mg | ORAL_TABLET | Freq: Every evening | ORAL | Status: DC
  Administered 2022-12-20: 10 mg via ORAL
  Filled 2022-12-20: qty 1

## 2022-12-20 MED ORDER — SIMVASTATIN 20 MG PO TABS
20.0000 mg | ORAL_TABLET | Freq: Every day | ORAL | Status: DC
  Administered 2022-12-20: 20 mg via ORAL
  Filled 2022-12-20: qty 1

## 2022-12-20 MED ORDER — TECHNETIUM TC 99M MEBROFENIN IV KIT
5.2100 | PACK | Freq: Once | INTRAVENOUS | Status: AC
  Administered 2022-12-20: 5.21 via INTRAVENOUS

## 2022-12-20 MED ORDER — BUDESON-GLYCOPYRROL-FORMOTEROL 160-9-4.8 MCG/ACT IN AERO: 2.0000 | INHALATION_SPRAY | Freq: Two times a day (BID) | RESPIRATORY_TRACT | Status: DC

## 2022-12-20 MED ORDER — PANTOPRAZOLE SODIUM 40 MG IV SOLR
40.0000 mg | Freq: Every day | INTRAVENOUS | Status: DC
  Administered 2022-12-20: 40 mg via INTRAVENOUS
  Filled 2022-12-20: qty 10

## 2022-12-20 MED ORDER — ACETAMINOPHEN 325 MG PO TABS: 650.0000 mg | ORAL_TABLET | Freq: Four times a day (QID) | ORAL | Status: DC | PRN

## 2022-12-20 MED ORDER — UMECLIDINIUM-VILANTEROL 62.5-25 MCG/ACT IN AEPB
1.0000 | INHALATION_SPRAY | Freq: Every day | RESPIRATORY_TRACT | Status: DC
  Filled 2022-12-20: qty 14

## 2022-12-20 MED ORDER — FENTANYL CITRATE PF 50 MCG/ML IJ SOSY
12.5000 ug | PREFILLED_SYRINGE | INTRAMUSCULAR | Status: DC | PRN
  Administered 2022-12-20 (×2): 12.5 ug via INTRAVENOUS
  Filled 2022-12-20 (×2): qty 1

## 2022-12-20 MED ORDER — ALBUTEROL SULFATE (2.5 MG/3ML) 0.083% IN NEBU: 2.5000 mg | INHALATION_SOLUTION | Freq: Four times a day (QID) | RESPIRATORY_TRACT | Status: DC | PRN

## 2022-12-20 MED ORDER — DIPHENHYDRAMINE HCL 25 MG PO CAPS
25.0000 mg | ORAL_CAPSULE | Freq: Three times a day (TID) | ORAL | Status: DC | PRN
  Administered 2022-12-20: 25 mg via ORAL
  Filled 2022-12-20: qty 1

## 2022-12-20 MED ORDER — MAGNESIUM SULFATE 2 GM/50ML IV SOLN
2.0000 g | Freq: Once | INTRAVENOUS | Status: AC
  Administered 2022-12-20: 2 g via INTRAVENOUS
  Filled 2022-12-20: qty 50

## 2022-12-20 MED ORDER — PIPERACILLIN-TAZOBACTAM 3.375 G IVPB
3.3750 g | Freq: Three times a day (TID) | INTRAVENOUS | Status: DC
  Administered 2022-12-20 – 2022-12-21 (×5): 3.375 g via INTRAVENOUS
  Filled 2022-12-20 (×5): qty 50

## 2022-12-20 MED ORDER — BUDESONIDE 0.5 MG/2ML IN SUSP
0.5000 mg | Freq: Two times a day (BID) | RESPIRATORY_TRACT | Status: DC
  Filled 2022-12-20 (×3): qty 2

## 2022-12-20 MED ORDER — HYDRALAZINE HCL 20 MG/ML IJ SOLN: 5.0000 mg | Freq: Four times a day (QID) | INTRAMUSCULAR | Status: DC | PRN

## 2022-12-20 MED ORDER — ACETAMINOPHEN 650 MG RE SUPP: 650.0000 mg | Freq: Four times a day (QID) | RECTAL | Status: DC | PRN

## 2022-12-20 NOTE — Assessment & Plan Note (Signed)
Improving creatinine, at 1.11.  Baseline seems to be less than 1. -Monitor renal function

## 2022-12-20 NOTE — Assessment & Plan Note (Signed)
Patient admitted with epigastric and right upper quadrant pain.  No fever or leukocytosis.  Ultrasound concerning for gallbladder wall thickening and positive Murphy sign and no evidence of cholelithiasis.  General surgery was consulted and HIDA scan was done which was negative. -Recommendations from general surgery -Continue with Zosyn

## 2022-12-20 NOTE — Assessment & Plan Note (Signed)
Blood pressure mildly elevated. On and losartan and HCTZ at home which is being held due to concern of AKI -As needed hydralazine -Can resume home antihypertensives in a day, if renal functions remained stable

## 2022-12-20 NOTE — Consult Note (Signed)
Referring Provider: Dr. Reesa Chew Primary Care Physician:  Wynelle Fanny, DO Primary Gastroenterologist:  Althia Forts  Reason for Consultation:  RUQ pain  HPI: Morgan Sparks is a 72 y.o. female admitted for severe 10/10 RUQ pain with nausea and concern for possible acute cholecystitis on U/S. HIDA scan negative for cystic duct or CBD obstruction with normal EF. She reports intermittent abdominal pain that varies in location for months. Pain is 10/10 when it occurs. Prior to this admission pain started in RUQ but also reports it moved to her LUQ and at times radiates to her back. BMs are regular and daily and denies any change in her BMs with the abdominal pain. Denies melena. Reports history of an esophageal stricture and previous EGD dilation several years ago at [facility]. Also has a personal history of colon polyps on a colonoscopy 8 years ago, but she is not sure when she needs another one but does not want to go back to [facility]. Husband in room.  Past Medical History:  Diagnosis Date   Diverticulitis    High cholesterol     Past Surgical History:  Procedure Laterality Date   ABDOMINAL HYSTERECTOMY      Prior to Admission medications   Medication Sig Start Date End Date Taking? Authorizing Provider  albuterol (VENTOLIN HFA) 108 (90 Base) MCG/ACT inhaler Inhale 2 puffs into the lungs daily as needed for wheezing or shortness of breath.  12/18/19  Yes [provider]  aspirin 81 MG tablet Take 81 mg by mouth 2 (two) times a week. Takes twice a week   Yes [provider]  BREZTRI AEROSPHERE 160-9-4.8 MCG/ACT AERO Take 2 puffs by mouth in the morning and at bedtime. 10/29/22  Yes [provider]  ezetimibe-simvastatin (VYTORIN) 10-20 MG tablet Take 1 tablet by mouth every evening.   Yes [provider]  fluticasone (FLONASE) 50 MCG/ACT nasal spray Place 1-2 sprays into both nostrils daily as needed for allergies. 03/07/22  Yes [provider]   hydrochlorothiazide (HYDRODIURIL) 25 MG tablet Take 25 mg by mouth every morning. 12/04/22  Yes [provider]  olmesartan (BENICAR) 20 MG tablet Take 20 mg by mouth daily.   Yes [provider]  azithromycin (ZITHROMAX) 250 MG tablet Take 1 tablet (250 mg total) by mouth daily. Take first 2 tablets together, then 1 every day until finished. Patient not taking: Reported on 12/19/2022 12/19/21   Deno Etienne, DO  lisinopril (ZESTRIL) 40 MG tablet Take 1 tablet (40 mg total) by mouth daily. Patient not taking: Reported on 12/19/2022 03/29/20   Dwyane Dee, MD  predniSONE (DELTASONE) 20 MG tablet 2 tabs po daily x 4 days Patient not taking: Reported on 12/19/2022 08/02/22   Deno Etienne, DO    Scheduled Meds:  umeclidinium-vilanterol  1 puff Inhalation Daily   And   budesonide (PULMICORT) nebulizer solution  0.5 mg Nebulization BID   ezetimibe  10 mg Oral QPM   And   simvastatin  20 mg Oral q1800   pantoprazole (PROTONIX) IV  40 mg Intravenous QHS   Continuous Infusions:  sodium chloride 125 mL/hr at 12/20/22 1328   magnesium sulfate bolus IVPB 2 g (12/20/22 1844)   piperacillin-tazobactam (ZOSYN)  IV 3.375 g (12/20/22 1851)   PRN Meds:.acetaminophen **OR** acetaminophen, albuterol, diphenhydrAMINE, fentaNYL (SUBLIMAZE) injection, fluticasone, hydrALAZINE, naLOXone (NARCAN)  injection, mouth rinse  Allergies as of 12/19/2022 - Review Complete 12/19/2022  Allergen Reaction Noted   Dilaudid [hydromorphone] Itching 12/19/2022   Doxycycline Hives 03/25/2020  Strawberry (diagnostic) Hives 03/25/2020    History reviewed. No pertinent family history.  Social History   Socioeconomic History   Marital status: Single    Spouse name: Not on file   Number of children: Not on file   Years of education: Not on file   Highest education level: Not on file  Occupational History   Not on file  Tobacco Use   Smoking status: Former   Smokeless tobacco: Never  Vaping Use    Vaping Use: Never used  Substance and Sexual Activity   Alcohol use: Yes    Comment: occ   Drug use: Not on file   Sexual activity: Not on file  Other Topics Concern   Not on file  Social History Narrative   Not on file   Social Determinants of Health   Financial Resource Strain: Not on file  Food Insecurity: No Food Insecurity (12/19/2022)   Hunger Vital Sign    Worried About Running Out of Food in the Last Year: Never true    Ran Out of Food in the Last Year: Never true  Transportation Needs: No Transportation Needs (12/19/2022)   PRAPARE - Hydrologist (Medical): No    Lack of Transportation (Non-Medical): No  Physical Activity: Not on file  Stress: Not on file  Social Connections: Not on file  Intimate Partner Violence: Not At Risk (12/19/2022)   Humiliation, Afraid, Rape, and Kick questionnaire    Fear of Current or Ex-Partner: No    Emotionally Abused: No    Physically Abused: No    Sexually Abused: No    Review of Systems: All negative except as stated above in HPI.  Physical Exam: Vital signs: Vitals:   12/20/22 0437 12/20/22 1253  BP: (!) 148/82 (!) 147/68  Pulse: 75 94  Resp: 18   Temp: 97.6 F (36.4 C) 97.9 F (36.6 C)  SpO2: 91% 95%   Last BM Date : 12/19/22 General:   Lethargic, Well-developed, well-nourished, pleasant and cooperative in NAD Head: normocephalic, atraumatic Eyes: anicteric sclera ENT: oropharynx clear Neck: supple, nontender Lungs:  Clear throughout to auscultation.   No wheezes, crackles, or rhonchi. No acute distress. Heart:  Regular rate and rhythm; no murmurs, clicks, rubs,  or gallops. Abdomen: RUQ, epigastric and LUQ tenderness with guarding, soft, nondistended, +BS  Rectal:  Deferred Ext: no edema  GI:  Lab Results: Recent Labs    12/19/22 0945 12/20/22 0423  WBC 5.0 4.1  HGB 15.7* 12.4  HCT 46.7* 38.8  PLT 248 193   BMET Recent Labs    12/19/22 0945 12/20/22 0423  NA 133* 137  K  3.7 3.7  CL 96* 106  CO2 26 23  GLUCOSE 95 88  BUN 25* 20  CREATININE 1.21* 1.11*  CALCIUM 10.2 8.8*   LFT Recent Labs    12/20/22 0423  PROT 6.2*  ALBUMIN 3.7  AST 18  ALT 20  ALKPHOS 67  BILITOT 0.8   PT/INR No results for input(s): "LABPROT", "INR" in the last 72 hours.   Studies/Results: NM Hepato W/EF  Result Date: 12/20/2022 CLINICAL DATA:  Pain right upper quadrant EXAM: NUCLEAR MEDICINE HEPATOBILIARY IMAGING WITH GALLBLADDER EF TECHNIQUE: Sequential images of the abdomen were obtained out to 60 minutes following intravenous administration of radiopharmaceutical. After oral ingestion of Ensure, gallbladder ejection fraction was determined. At 60 min, normal ejection fraction is greater than 33%. RADIOPHARMACEUTICALS:  5.21 mCi Tc-21m  Choletec IV COMPARISON:  Abdomen sonogram done  on 12/19/2022 FINDINGS: Prompt uptake and biliary excretion of activity by the liver is seen. Gallbladder activity is visualized, consistent with patency of cystic duct. Biliary activity passes into small bowel, consistent with patent common bile duct. Liver appears to be enlarged. Calculated gallbladder ejection fraction is 81%. (Normal gallbladder ejection fraction with Ensure is greater than 33%.) IMPRESSION: There is no obstruction of cystic duct or common bile duct. Estimated gallbladder ejection fraction is 81%. Electronically Signed   By: Elmer Picker M.D.   On: 12/20/2022 13:04   CT Angio Chest/Abd/Pel for Dissection W and/or Wo Contrast  Result Date: 12/19/2022 CLINICAL DATA:  Pain and cramping under the right breast with concern for acute aortic syndrome EXAM: CT ANGIOGRAPHY CHEST, ABDOMEN AND PELVIS TECHNIQUE: Non-contrast CT of the chest was initially obtained. Multidetector CT imaging through the chest, abdomen and pelvis was performed using the standard protocol during bolus administration of intravenous contrast. Multiplanar reconstructed images and MIPs were obtained and reviewed  to evaluate the vascular anatomy. RADIATION DOSE REDUCTION: This exam was performed according to the departmental dose-optimization program which includes automated exposure control, adjustment of the mA and/or kV according to patient size and/or use of iterative reconstruction technique. CONTRAST:  8mL OMNIPAQUE IOHEXOL 350 MG/ML SOLN COMPARISON:  CT abdomen and pelvis dated 03/28/2020 FINDINGS: CTA CHEST FINDINGS Cardiovascular: Preferential opacification of the thoracic aorta. No evidence of thoracic aortic aneurysm or dissection. Normal heart size. No pericardial effusion. Coronary artery calcifications. No central pulmonary emboli. Moderate narrowing of the left subclavian artery origin due to atherosclerotic plaque. Mediastinum/Nodes: Imaged thyroid gland without nodules meeting criteria for imaging follow-up by size. Normal esophagus. No pathologically enlarged axillary, supraclavicular, mediastinal, or hilar lymph nodes. Lungs/Pleura: The central airways are patent. Layering secretions within the trachea. No focal consolidation. Lateral right apical nodule measures 4 x 3 mm (6:9 no pneumothorax. No pleural effusion. Musculoskeletal: No acute or abnormal lytic or blastic osseous lesions. Review of the MIP images confirms the above findings. CTA ABDOMEN AND PELVIS FINDINGS VASCULAR Aorta: Aortic atherosclerosis. Normal caliber aorta without aneurysm, dissection, vasculitis or significant stenosis. Celiac: Patent without evidence of aneurysm, dissection, vasculitis or significant stenosis. SMA: Patent without evidence of aneurysm, dissection, vasculitis or significant stenosis. Renals: Single renal arteries. Moderate narrowing of the right renal artery origin and mild narrowing of the left renal artery origin. No evidence of aneurysm, dissection, vasculitis, or fibromuscular dysplasia. IMA: Patent without evidence of aneurysm, dissection, vasculitis or significant stenosis. Inflow: Segmental mild-to-moderate  narrowing due to atherosclerotic plaque. No evidence of aneurysm, dissection, or vasculitis. Proximal Outflow: Bilateral common femoral and visualized portions of the superficial and profunda femoral arteries are patent without evidence of aneurysm, dissection, vasculitis or significant stenosis. Veins: No obvious venous abnormality within the limitations of this arterial phase study. Review of the MIP images confirms the above findings. NON-VASCULAR Hepatobiliary: No focal hepatic lesions. No intra or extrahepatic biliary ductal dilation. Normal gallbladder. Pancreas: No focal lesions or main ductal dilation. Spleen: Normal in size without focal abnormality. Adrenals/Urinary Tract: No adrenal nodules. No suspicious renal mass, calculi or hydronephrosis. No focal bladder wall thickening. Stomach/Bowel: Normal appearance of the stomach. No evidence of bowel wall thickening, distention, or inflammatory changes. Colonic diverticulosis without acute diverticulitis. Normal appendix. Lymphatic: No enlarged abdominal or pelvic lymph nodes. Reproductive: No adnexal masses. Other: No free fluid, fluid collection, or free air. Musculoskeletal: No acute or abnormal lytic or blastic osseous lesions. Review of the MIP images confirms the above findings. IMPRESSION: 1. No evidence  of acute aortic syndrome. 2. No acute abnormality in the chest, abdomen, or pelvis. 3. Extensive calcified and noncalcified atherosclerotic plaque. Aortic Atherosclerosis (ICD10-I70.0). Coronary artery calcifications. Assessment for potential risk factor modification, dietary therapy or pharmacologic therapy may be warranted, if clinically indicated. 4. Right apical 4 x 3 mm pulmonary nodule. No follow-up needed if patient is low-risk.This recommendation follows the consensus statement: Guidelines for Management of Incidental Pulmonary Nodules Detected on CT Images: From the Fleischner Society 2017; Radiology 2017; 284:228-243. Electronically Signed    By: Darrin Nipper M.D.   On: 12/19/2022 13:21   US Abdomen Limited RUQ (LIVER/GB)  Result Date: 12/19/2022 CLINICAL DATA:  Right upper quadrant pain. EXAM: ULTRASOUND ABDOMEN LIMITED RIGHT UPPER QUADRANT COMPARISON:  None Available. FINDINGS: Gallbladder: Gallbladder wall thickening measuring up to 5 mm. Per the performing sonographer, there was a positive sonographic Murphy sign. There is no evidence of cholelithiasis. Common bile duct: Diameter: 2 mm, which is normal Liver: No focal lesion identified. Within normal limits in parenchymal echogenicity. Portal vein is patent on color Doppler imaging with normal direction of blood flow towards the liver. Other: None. IMPRESSION: Gallbladder wall thickening with positive sonographic Murphy sign. No evidence of cholelithiasis. Findings are equivocal for acute cholecystitis. Electronically Signed   By: Marin Roberts M.D.   On: 12/19/2022 11:59    Impression/Plan: Chronic abdominal pain with concern for acute cholecystitis on U/S but HIDA scan negative. LFTs and lipase normal. Her symptoms seem to be functional and not focal and doubt peptic ulcer disease or obstructive process. Recommend Dicyclomine prn and cautioned on potential side effects. I do not think an EGD is needed at this time. She is likely overdue for a colonoscopy if her polyps in the past were adenomatous but no need to do colonoscopy as an inpt unless abdominal pain returns and persists. Advance diet. D/C Zosyn. Will have her f/u with me in May.    LOS: 1 day   Lear Ng  12/20/2022, 7:35 PM  Questions please call [phone]

## 2022-12-20 NOTE — Progress Notes (Signed)
RT note: Pt. declined Pulmicort Nebulizer this visit, using own Inhaler.

## 2022-12-20 NOTE — Progress Notes (Signed)
Progress Note   Patient: Morgan Sparks [id number] DOB: 1951-01-06 DOA: 12/19/2022     1 DOS: the patient was seen and examined on 12/20/2022   Brief hospital course: Taken from H&P.  Morgan Sparks is a 72 y.o. female with medical history significant of sigmoid diverticulitis in 2021 with abscess which was managed conservatively, hypertension, hyperlipidemia, COPD presented to ED with acute onset severe epigastric and right upper quadrant abdominal pain.  Blood pressure elevated with systolic in the [id number] but remainder vital signs stable on arrival to the ED.  Labs showing no leukocytosis, hemoglobin 15.7, sodium 133, BUN 25, creatinine 1.2, lipase and LFTs normal, troponin negative x 2, D-dimer normal.  UA with negative nitrate, trace leukocytes, and microscopy showing 6-10 WBCs, and many bacteria.  CTA chest/abdomen/pelvis showing incidental pulmonary nodule but no acute finding.  Right upper quadrant ultrasound showing gallbladder wall thickening with positive sonographic Murphy sign but no evidence of cholelithiasis. Findings are equivocal for acute cholecystitis. Patient was evaluated by general surgery, recommended HIDA scan for further evaluation.  She was started on Zosyn.  4/1: Vital stable, afebrile.  Labs with magnesium of 1.7, creatinine improving at 1.11.  CBC with macrocytosis at 104.  HIDA scan negative.  Surgery is on board awaiting final recommendations.    Assessment and Plan: * Acute cholecystitis Patient admitted with epigastric and right upper quadrant pain.  No fever or leukocytosis.  Ultrasound concerning for gallbladder wall thickening and positive Murphy sign and no evidence of cholelithiasis.  General surgery was consulted and HIDA scan was done which was negative. -Recommendations from general surgery -Continue with Zosyn  AKI (acute kidney injury) Improving creatinine, at 1.11.  Baseline seems to be less than 1. -Monitor renal function  Hyponatremia Resolved  with IV fluid  Asymptomatic bacteriuria UA with negative nitrate, trace leukocytes, and microscopy showing 6-10 WBCs, and many bacteria.  Patient is not endorsing any urinary symptoms.  No indication for treatment at this time although already on Zosyn due to concern for possible acute cholecystitis.   Essential hypertension Blood pressure mildly elevated. On and losartan and HCTZ at home which is being held due to concern of AKI -As needed hydralazine -Can resume home antihypertensives in a day, if renal functions remained stable  Hyperlipidemia -Continue home ezetimibe and simvastatin  QT prolongation QTc 513, no prior EKG for comparison. Monitor potassium and magnesium levels, replace if low. Avoid QT prolonging drugs   Pulmonary nodule Incidental finding on CT showing right apical 4 x 3 mm pulmonary nodule. She is a former smoker. Will need close outpatient follow-up for monitoring.    Subjective: Patient continued to have right upper quadrant pain, stating that it is much improved than before.  The help of pain medications.  Physical Exam: Vitals:   12/19/22 2015 12/20/22 0030 12/20/22 0437 12/20/22 1253  BP: (!) 144/80 (!) 152/85 (!) 148/82 (!) 147/68  Pulse: 77 72 75 94  Resp: 18 16 18    Temp: 97.8 F (36.6 C) 97.7 F (36.5 C) 97.6 F (36.4 C) 97.9 F (36.6 C)  TempSrc: Oral Oral Oral Oral  SpO2: 99% 99% 91% 95%  Weight: 72.1 kg     Height: 5\' 5"  (1.651 m)      General.  Pleasant elderly lady, in no acute distress. Pulmonary.  Lungs clear bilaterally, normal respiratory effort. CV.  Regular rate and rhythm, no JVD, rub or murmur. Abdomen.  Soft, RUQ tenderness with positive Murphy, nondistended, BS positive. CNS.  Alert and oriented .  No focal neurologic deficit. Extremities.  No edema, no cyanosis, pulses intact and symmetrical. Psychiatry.  Judgment and insight appears normal.   Data Reviewed: Prior data reviewed  Family Communication: Discussed with  daughter at bedside  Disposition: Status is: Inpatient Remains inpatient appropriate because: Severity of illness  Planned Discharge Destination: Home  Time spent: 50 minutes  This record has been created using Systems analyst. Errors have been sought and corrected,but may not always be located. Such creation errors do not reflect on the standard of care.   Author: Lorella Nimrod, MD 12/20/2022 2:15 PM  For on call review [email or web address].

## 2022-12-20 NOTE — Progress Notes (Addendum)
[facility] Progress Note     Subjective: CC:   Patient reports multiple days of upper abdominal pain that got acutely worse on Sunday (yesterday). Pain described as RUQ and epigastric but also moves to her left side. Says the pain is not associated with PO intake. Associated w/ nausea but denies vomiting. Patients daughter and friend at the bedside state the pain has been going on for months, not days. She has acid reflux at baseline. Reports upper endoscopy about 2 years ago for esophageal dilation and denies known history of stomach ulcers. Denies blood in her stool or melena. Past abdominal surgeries: hysterectomy.   Objective: Vital signs in last 24 hours: Temp:  [97.6 F (36.4 C)-98 F (36.7 C)] 97.6 F (36.4 C) (04/01 0437) Pulse Rate:  [72-92] 75 (04/01 0437) Resp:  [16-27] 18 (04/01 0437) BP: (133-172)/(76-99) 148/82 (04/01 0437) SpO2:  [91 %-100 %] 91 % (04/01 0437) Weight:  [72.1 kg] 72.1 kg (03/31 2015) Last BM Date : 12/19/22  Intake/Output from previous day: 03/31 0701 - 04/01 0700 In: 1340 [P.O.:240; IV Piggyback:1100] Out: 450 [Urine:450] Intake/Output this shift: No intake/output data recorded.  PE: Gen:  Alert, NAD, pleasant Card:  Regular rate and rhythm Pulm:  Normal effort ORA Abd: Soft, mild distention, TTP RUQ/epigastric regions with guarding, no peritonitis  Skin: warm and dry, no rashes  Psych: A&Ox3   Lab Results:  Recent Labs    12/19/22 0945 12/20/22 0423  WBC 5.0 4.1  HGB 15.7* 12.4  HCT 46.7* 38.8  PLT 248 193   BMET Recent Labs    12/19/22 0945 12/20/22 0423  NA 133* 137  K 3.7 3.7  CL 96* 106  CO2 26 23  GLUCOSE 95 88  BUN 25* 20  CREATININE 1.21* 1.11*  CALCIUM 10.2 8.8*   PT/INR No results for input(s): "LABPROT", "INR" in the last 72 hours. CMP     Component Value Date/Time   NA 137 12/20/2022 0423   K 3.7 12/20/2022 0423   CL 106 12/20/2022 0423   CO2 23 12/20/2022 0423   GLUCOSE 88 12/20/2022 0423    BUN 20 12/20/2022 0423   CREATININE 1.11 (H) 12/20/2022 0423   CALCIUM 8.8 (L) 12/20/2022 0423   PROT 6.2 (L) 12/20/2022 0423   ALBUMIN 3.7 12/20/2022 0423   AST 18 12/20/2022 0423   ALT 20 12/20/2022 0423   ALKPHOS 67 12/20/2022 0423   BILITOT 0.8 12/20/2022 0423   GFRNONAA 53 (L) 12/20/2022 0423   GFRAA >60 03/28/2020 0516   Lipase     Component Value Date/Time   LIPASE 23 12/19/2022 0945       Studies/Results: CT Angio Chest/Abd/Pel for Dissection W and/or Wo Contrast  Result Date: 12/19/2022 CLINICAL DATA:  Pain and cramping under the right breast with concern for acute aortic syndrome EXAM: CT ANGIOGRAPHY CHEST, ABDOMEN AND PELVIS TECHNIQUE: Non-contrast CT of the chest was initially obtained. Multidetector CT imaging through the chest, abdomen and pelvis was performed using the standard protocol during bolus administration of intravenous contrast. Multiplanar reconstructed images and MIPs were obtained and reviewed to evaluate the vascular anatomy. RADIATION DOSE REDUCTION: This exam was performed according to the departmental dose-optimization program which includes automated exposure control, adjustment of the mA and/or kV according to patient size and/or use of iterative reconstruction technique. CONTRAST:  38mL OMNIPAQUE IOHEXOL 350 MG/ML SOLN COMPARISON:  CT abdomen and pelvis dated 03/28/2020 FINDINGS: CTA CHEST FINDINGS Cardiovascular: Preferential opacification of the thoracic aorta. No evidence of  thoracic aortic aneurysm or dissection. Normal heart size. No pericardial effusion. Coronary artery calcifications. No central pulmonary emboli. Moderate narrowing of the left subclavian artery origin due to atherosclerotic plaque. Mediastinum/Nodes: Imaged thyroid gland without nodules meeting criteria for imaging follow-up by size. Normal esophagus. No pathologically enlarged axillary, supraclavicular, mediastinal, or hilar lymph nodes. Lungs/Pleura: The central airways are  patent. Layering secretions within the trachea. No focal consolidation. Lateral right apical nodule measures 4 x 3 mm (6:9 no pneumothorax. No pleural effusion. Musculoskeletal: No acute or abnormal lytic or blastic osseous lesions. Review of the MIP images confirms the above findings. CTA ABDOMEN AND PELVIS FINDINGS VASCULAR Aorta: Aortic atherosclerosis. Normal caliber aorta without aneurysm, dissection, vasculitis or significant stenosis. Celiac: Patent without evidence of aneurysm, dissection, vasculitis or significant stenosis. SMA: Patent without evidence of aneurysm, dissection, vasculitis or significant stenosis. Renals: Single renal arteries. Moderate narrowing of the right renal artery origin and mild narrowing of the left renal artery origin. No evidence of aneurysm, dissection, vasculitis, or fibromuscular dysplasia. IMA: Patent without evidence of aneurysm, dissection, vasculitis or significant stenosis. Inflow: Segmental mild-to-moderate narrowing due to atherosclerotic plaque. No evidence of aneurysm, dissection, or vasculitis. Proximal Outflow: Bilateral common femoral and visualized portions of the superficial and profunda femoral arteries are patent without evidence of aneurysm, dissection, vasculitis or significant stenosis. Veins: No obvious venous abnormality within the limitations of this arterial phase study. Review of the MIP images confirms the above findings. NON-VASCULAR Hepatobiliary: No focal hepatic lesions. No intra or extrahepatic biliary ductal dilation. Normal gallbladder. Pancreas: No focal lesions or main ductal dilation. Spleen: Normal in size without focal abnormality. Adrenals/Urinary Tract: No adrenal nodules. No suspicious renal mass, calculi or hydronephrosis. No focal bladder wall thickening. Stomach/Bowel: Normal appearance of the stomach. No evidence of bowel wall thickening, distention, or inflammatory changes. Colonic diverticulosis without acute diverticulitis. Normal  appendix. Lymphatic: No enlarged abdominal or pelvic lymph nodes. Reproductive: No adnexal masses. Other: No free fluid, fluid collection, or free air. Musculoskeletal: No acute or abnormal lytic or blastic osseous lesions. Review of the MIP images confirms the above findings. IMPRESSION: 1. No evidence of acute aortic syndrome. 2. No acute abnormality in the chest, abdomen, or pelvis. 3. Extensive calcified and noncalcified atherosclerotic plaque. Aortic Atherosclerosis (ICD10-I70.0). Coronary artery calcifications. Assessment for potential risk factor modification, dietary therapy or pharmacologic therapy may be warranted, if clinically indicated. 4. Right apical 4 x 3 mm pulmonary nodule. No follow-up needed if patient is low-risk.This recommendation follows the consensus statement: Guidelines for Management of Incidental Pulmonary Nodules Detected on CT Images: From the Fleischner Society 2017; Radiology 2017; 284:228-243. Electronically Signed   By: Darrin Nipper M.D.   On: 12/19/2022 13:21   US Abdomen Limited RUQ (LIVER/GB)  Result Date: 12/19/2022 CLINICAL DATA:  Right upper quadrant pain. EXAM: ULTRASOUND ABDOMEN LIMITED RIGHT UPPER QUADRANT COMPARISON:  None Available. FINDINGS: Gallbladder: Gallbladder wall thickening measuring up to 5 mm. Per the performing sonographer, there was a positive sonographic Murphy sign. There is no evidence of cholelithiasis. Common bile duct: Diameter: 2 mm, which is normal Liver: No focal lesion identified. Within normal limits in parenchymal echogenicity. Portal vein is patent on color Doppler imaging with normal direction of blood flow towards the liver. Other: None. IMPRESSION: Gallbladder wall thickening with positive sonographic Murphy sign. No evidence of cholelithiasis. Findings are equivocal for acute cholecystitis. Electronically Signed   By: Marin Roberts M.D.   On: 12/19/2022 11:59    Anti-infectives: Anti-infectives (From admission, onward)  Start      Dose/Rate Route Frequency Ordered Stop   12/20/22 0200  piperacillin-tazobactam (ZOSYN) IVPB 3.375 g        3.375 g 12.5 mL/hr over 240 Minutes Intravenous Every 8 hours 12/20/22 0113     12/19/22 1215  piperacillin-tazobactam (ZOSYN) IVPB 3.375 g        3.375 g 100 mL/hr over 30 Minutes Intravenous  Once 12/19/22 1210 12/19/22 1412        Assessment/Plan RUQ/epigastric abdominal pain and nausea, no gallstones Agree with HIDA to r/o cholecystitis. Patient should go down any time for the study. [facility] will follow   No signs of gastric ulcer on CT. History of GERD and has epigastric pain. Will start PPI empirically incase this is related to PUD/gastritis.      LOS: 1 day   I reviewed nursing notes, hospitalist notes, last 24 h vitals and pain scores, last 48 h intake and output, last 24 h labs and trends, and last 24 h imaging results.  This care required moderate level of medical decision making.   Obie Dredge, PA-C [facility] Please see Amion for pager number during day hours 7:00am-4:30pm

## 2022-12-20 NOTE — Assessment & Plan Note (Signed)
UA with negative nitrate, trace leukocytes, and microscopy showing 6-10 WBCs, and many bacteria.  Patient is not endorsing any urinary symptoms.  No indication for treatment at this time although already on Zosyn due to concern for possible acute cholecystitis.

## 2022-12-20 NOTE — Progress Notes (Signed)
Pharmacy Antibiotic Note  Timberlynn Stanback is a 72 y.o. female admitted on 12/19/2022 with  acute cholecystitis .  Pharmacy has been consulted for Zosyn dosing.  Plan: Zosyn 3.375g IV q8h (4 hour infusion). No dose adjustments anticipated.  Pharmacy will sign off and monitor peripherally via electronic surveillance software for any changes in renal function or micro data.   Height: 5\' 5"  (165.1 cm) Weight: 72.1 kg (158 lb 15.2 oz) IBW/kg (Calculated) : 57  Temp (24hrs), Avg:97.8 F (36.6 C), Min:97.6 F (36.4 C), Max:98 F (36.7 C)  Recent Labs  Lab 12/19/22 0945  WBC 5.0  CREATININE 1.21*    Estimated Creatinine Clearance: 43 mL/min (A) (by C-G formula based on SCr of 1.21 mg/dL (H)).    Allergies  Allergen Reactions   Dilaudid [Hydromorphone] Itching   Doxycycline Hives   Strawberry (Diagnostic) Hives    Thank you for allowing pharmacy to be a part of this patient's care.  Netta Cedars PharmD 12/20/2022 1:14 AM

## 2022-12-20 NOTE — Assessment & Plan Note (Signed)
-  Continue home ezetimibe and simvastatin

## 2022-12-20 NOTE — Assessment & Plan Note (Signed)
Resolved with IV fluid °

## 2022-12-20 NOTE — Assessment & Plan Note (Signed)
QTc 513, no prior EKG for comparison. Monitor potassium and magnesium levels, replace if low. Avoid QT prolonging drugs

## 2022-12-20 NOTE — Assessment & Plan Note (Signed)
Incidental finding on CT showing right apical 4 x 3 mm pulmonary nodule. She is a former smoker. Will need close outpatient follow-up for monitoring.

## 2022-12-21 DIAGNOSIS — R1011 Right upper quadrant pain: Principal | ICD-10-CM

## 2022-12-21 DIAGNOSIS — I10 Essential (primary) hypertension: Secondary | ICD-10-CM | POA: Diagnosis not present

## 2022-12-21 DIAGNOSIS — K810 Acute cholecystitis: Secondary | ICD-10-CM | POA: Diagnosis not present

## 2022-12-21 DIAGNOSIS — R8271 Bacteriuria: Secondary | ICD-10-CM | POA: Diagnosis not present

## 2022-12-21 LAB — BASIC METABOLIC PANEL
Anion gap: 6 (ref 5–15)
BUN: 12 mg/dL (ref 8–23)
CO2: 23 mmol/L (ref 22–32)
Calcium: 9 mg/dL (ref 8.9–10.3)
Chloride: 108 mmol/L (ref 98–111)
Creatinine, Ser: 1.18 mg/dL — ABNORMAL HIGH (ref 0.44–1.00)
GFR, Estimated: 50 mL/min — ABNORMAL LOW (ref >60)
Glucose, Bld: 98 mg/dL (ref 70–99)
Potassium: 4.5 mmol/L (ref 3.5–5.1)
Sodium: 137 mmol/L (ref 135–145)

## 2022-12-21 MED ORDER — BUDESON-GLYCOPYRROL-FORMOTEROL 160-9-4.8 MCG/ACT IN AERO
2.0000 | INHALATION_SPRAY | Freq: Two times a day (BID) | RESPIRATORY_TRACT | Status: DC
  Administered 2022-12-21: 2 via RESPIRATORY_TRACT

## 2022-12-21 MED ORDER — BUDESON-GLYCOPYRROL-FORMOTEROL 160-9-4.8 MCG/ACT IN AERO: 2.0000 | INHALATION_SPRAY | Freq: Two times a day (BID) | RESPIRATORY_TRACT | Status: DC

## 2022-12-21 MED ORDER — DICYCLOMINE HCL 10 MG PO CAPS: 10.0000 mg | ORAL_CAPSULE | Freq: Two times a day (BID) | ORAL | 0 refills | Status: DC | PRN

## 2022-12-21 MED ORDER — IRBESARTAN 150 MG PO TABS
150.0000 mg | ORAL_TABLET | Freq: Every day | ORAL | Status: DC
  Administered 2022-12-21: 150 mg via ORAL
  Filled 2022-12-21: qty 1

## 2022-12-21 MED ORDER — OMEPRAZOLE 20 MG PO CPDR: 20.0000 mg | DELAYED_RELEASE_CAPSULE | Freq: Every day | ORAL | 1 refills | Status: DC

## 2022-12-21 NOTE — Progress Notes (Signed)
PT is taking med from home. [facility] is showing on Worklist but not [facility]- Pharmacy aware.

## 2022-12-21 NOTE — Discharge Instructions (Signed)
Please follow-up with the pulmonologist for a concern of pulmonary nodule and you will need a repeat CT chest in 3 to 4 months

## 2022-12-21 NOTE — TOC Progression Note (Signed)
Transition of Care [facility]) - Progression Note    Patient Details  Name: Morgan Sparks MRN: [id number] Date of Birth: 04/17/1951  Transition of Care [facility]) CM/SW Contact  Purcell Mouton, RN Phone Number: 12/21/2022, 12:52 PM  Clinical Narrative:     Transition of Care (TOC) Screening Note   Patient Details  Name: Morgan Sparks Date of Birth: 02-21-1951   Transition of Care [facility]) CM/SW Contact:    Purcell Mouton, RN Phone Number: 12/21/2022, 12:52 PM    Transition of Care [facility]) has reviewed patient and no TOC needs have been identified at this time. We will continue to monitor patient advancement through interdisciplinary progression rounds. If new patient transition needs arise, please place a TOC consult.          Expected Discharge Plan and Services         Expected Discharge Date: 12/21/22                                     Social Determinants of Health (SDOH) Interventions SDOH Screenings   Food Insecurity: No Food Insecurity (12/19/2022)  Housing: Low Risk  (12/19/2022)  Transportation Needs: No Transportation Needs (12/19/2022)  Utilities: Not At Risk (12/19/2022)  Tobacco Use: Medium Risk (12/19/2022)    Readmission Risk Interventions     No data to display

## 2022-12-21 NOTE — Discharge Summary (Signed)
Physician Discharge Summary   Patient: Morgan Sparks MRN: [id number] DOB: [birth year]  Admit date:     12/19/2022  Discharge date: 12/21/22  Discharge Physician: Lorella Nimrod   PCP: Wynelle Fanny, DO   Recommendations at discharge:  Please obtain CBC and BMP in 1 week Follow-up with primary care provider Follow with gastroenterology Follow-up with pulmonary.  Patient with incidental finding of pulmonary nodule which need follow-up.  Discharge Diagnoses: Principal Problem:   Acute cholecystitis Active Problems:   AKI (acute kidney injury)   Hyponatremia   Asymptomatic bacteriuria   Essential hypertension   Hyperlipidemia   QT prolongation   Pulmonary nodule   RUQ pain   Hospital Course: Taken from H&P.  Morgan Sparks is a 72 y.o. female with medical history significant of sigmoid diverticulitis in 2021 with abscess which was managed conservatively, hypertension, hyperlipidemia, COPD presented to ED with acute onset severe epigastric and right upper quadrant abdominal pain.  Blood pressure elevated with systolic in the [id number] but remainder vital signs stable on arrival to the ED.  Labs showing no leukocytosis, hemoglobin 15.7, sodium 133, BUN 25, creatinine 1.2, lipase and LFTs normal, troponin negative x 2, D-dimer normal.  UA with negative nitrate, trace leukocytes, and microscopy showing 6-10 WBCs, and many bacteria.  CTA chest/abdomen/pelvis showing incidental pulmonary nodule but no acute finding.  Right upper quadrant ultrasound showing gallbladder wall thickening with positive sonographic Murphy sign but no evidence of cholelithiasis. Findings are equivocal for acute cholecystitis. Patient was evaluated by general surgery, recommended HIDA scan for further evaluation.  She was started on Zosyn.  4/1: Vital stable, afebrile.  Labs with magnesium of 1.7, creatinine improving at 1.11.  CBC with macrocytosis at 104.  HIDA scan negative.  Surgery is on board awaiting final  recommendations.  4/2: Patient remained stable.  Surgery signed off as HIDA scan was negative so it is not her gallbladder that issue, no need for any surgical intervention.  They also recommended follow-up with gastroenterologist which was consulted and they were recommending outpatient follow-up.  Very unlikely peptic ulcer disease. Patient with significant improvement in her symptoms and pain.  Tolerating advancement in diet well.  Antibiotics were discontinued as advised by gastroenterologist.  No need for EGD at this time.  Patient will need a repeat colonoscopy and will follow-up with them as an outpatient for that need.  Did advise using Bentyl as needed if have some spasmodic pain.  Patient also need to follow-up with a pulmonologist for concern of pulmonary nodule and will need a repeat CT chest in 79-month.  Patient will continue on her home medications and need to have a close follow-up with her providers for further recommendations.    Assessment and Plan: * Acute cholecystitis Patient admitted with epigastric and right upper quadrant pain.  No fever or leukocytosis.  Ultrasound concerning for gallbladder wall thickening and positive Murphy sign and no evidence of cholelithiasis.  General surgery was consulted and HIDA scan was done which was negative. -Recommendations from general surgery -Continue with Zosyn  AKI (acute kidney injury) Improving creatinine, at 1.11.  Baseline seems to be less than 1. -Monitor renal function  Hyponatremia Resolved with IV fluid  Asymptomatic bacteriuria UA with negative nitrate, trace leukocytes, and microscopy showing 6-10 WBCs, and many bacteria.  Patient is not endorsing any urinary symptoms.  No indication for treatment at this time although already on Zosyn due to concern for possible acute cholecystitis.   Essential hypertension Blood pressure mildly elevated. On and  losartan and HCTZ at home which is being held due to concern of  AKI -As needed hydralazine -Can resume home antihypertensives in a day, if renal functions remained stable  Hyperlipidemia -Continue home ezetimibe and simvastatin  QT prolongation QTc 513, no prior EKG for comparison. Monitor potassium and magnesium levels, replace if low. Avoid QT prolonging drugs   Pulmonary nodule Incidental finding on CT showing right apical 4 x 3 mm pulmonary nodule. She is a former smoker. Will need close outpatient follow-up for monitoring.    Consultants: General surgery.  Gastroenterology Procedures performed: None Disposition: Home Diet recommendation:  Discharge Diet Orders (From admission, onward)     Start     Ordered   12/21/22 0000  Diet - low sodium heart healthy        12/21/22 1157           Cardiac diet DISCHARGE MEDICATION: Allergies as of 12/21/2022       Reactions   Dilaudid [hydromorphone] Itching   Doxycycline Hives   Strawberry (diagnostic) Hives        Medication List     STOP taking these medications    azithromycin 250 MG tablet Commonly known as: ZITHROMAX   lisinopril 40 MG tablet Commonly known as: ZESTRIL   predniSONE 20 MG tablet Commonly known as: DELTASONE       TAKE these medications    albuterol 108 (90 Base) MCG/ACT inhaler Commonly known as: VENTOLIN HFA Inhale 2 puffs into the lungs daily as needed for wheezing or shortness of breath.   aspirin 81 MG tablet Take 81 mg by mouth 2 (two) times a week. Takes twice a week   Breztri Aerosphere 160-9-4.8 MCG/ACT Aero Generic drug: Budeson-Glycopyrrol-Formoterol Take 2 puffs by mouth in the morning and at bedtime.   dicyclomine 10 MG capsule Commonly known as: BENTYL Take 1 capsule (10 mg total) by mouth 2 (two) times daily as needed for spasms.   ezetimibe-simvastatin 10-20 MG tablet Commonly known as: VYTORIN Take 1 tablet by mouth every evening.   fluticasone 50 MCG/ACT nasal spray Commonly known as: FLONASE Place 1-2 sprays into both  nostrils daily as needed for allergies.   hydrochlorothiazide 25 MG tablet Commonly known as: HYDRODIURIL Take 25 mg by mouth every morning.   olmesartan 20 MG tablet Commonly known as: BENICAR Take 20 mg by mouth daily.   omeprazole 20 MG capsule Commonly known as: PRILOSEC Take 1 capsule (20 mg total) by mouth daily.        Follow-up Information     Wynelle Fanny, DO. Schedule an appointment as soon as possible for a visit in 1 week(s).   Specialty: Internal Medicine Contact information: [address] [place] [address] [phone]         Wilford Corner, MD. Schedule an appointment as soon as possible for a visit.   Specialty: Gastroenterology Contact information: [id number] [place]  [address] [phone]                Discharge Exam: Danley Danker Weights   12/19/22 2015  Weight: 72.1 kg   General.     In no acute distress. Pulmonary.  Lungs clear bilaterally, normal respiratory effort. CV.  Regular rate and rhythm, no JVD, rub or murmur. Abdomen.  Soft, nontender, nondistended, BS positive. CNS.  Alert and oriented .  No focal neurologic deficit. Extremities.  No edema, no cyanosis, pulses intact and symmetrical. Psychiatry.  Judgment and insight appears normal.  Condition at discharge: stable  The results of significant diagnostics from this hospitalization (including imaging, microbiology, ancillary and laboratory) are listed below for reference.   Imaging Studies: NM Hepato W/EF  Result Date: 12/20/2022 CLINICAL DATA:  Pain right upper quadrant EXAM: NUCLEAR MEDICINE HEPATOBILIARY IMAGING WITH GALLBLADDER EF TECHNIQUE: Sequential images of the abdomen were obtained out to 60 minutes following intravenous administration of radiopharmaceutical. After oral ingestion of Ensure, gallbladder ejection fraction was determined. At 60 min, normal ejection fraction is greater than 33%. RADIOPHARMACEUTICALS:  5.21 mCi Tc-58m   Choletec IV COMPARISON:  Abdomen sonogram done on 12/19/2022 FINDINGS: Prompt uptake and biliary excretion of activity by the liver is seen. Gallbladder activity is visualized, consistent with patency of cystic duct. Biliary activity passes into small bowel, consistent with patent common bile duct. Liver appears to be enlarged. Calculated gallbladder ejection fraction is 81%. (Normal gallbladder ejection fraction with Ensure is greater than 33%.) IMPRESSION: There is no obstruction of cystic duct or common bile duct. Estimated gallbladder ejection fraction is 81%. Electronically Signed   By: Elmer Picker M.D.   On: 12/20/2022 13:04   CT Angio Chest/Abd/Pel for Dissection W and/or Wo Contrast  Result Date: 12/19/2022 CLINICAL DATA:  Pain and cramping under the right breast with concern for acute aortic syndrome EXAM: CT ANGIOGRAPHY CHEST, ABDOMEN AND PELVIS TECHNIQUE: Non-contrast CT of the chest was initially obtained. Multidetector CT imaging through the chest, abdomen and pelvis was performed using the standard protocol during bolus administration of intravenous contrast. Multiplanar reconstructed images and MIPs were obtained and reviewed to evaluate the vascular anatomy. RADIATION DOSE REDUCTION: This exam was performed according to the departmental dose-optimization program which includes automated exposure control, adjustment of the mA and/or kV according to patient size and/or use of iterative reconstruction technique. CONTRAST:  39mL OMNIPAQUE IOHEXOL 350 MG/ML SOLN COMPARISON:  CT abdomen and pelvis dated 03/28/2020 FINDINGS: CTA CHEST FINDINGS Cardiovascular: Preferential opacification of the thoracic aorta. No evidence of thoracic aortic aneurysm or dissection. Normal heart size. No pericardial effusion. Coronary artery calcifications. No central pulmonary emboli. Moderate narrowing of the left subclavian artery origin due to atherosclerotic plaque. Mediastinum/Nodes: Imaged thyroid gland  without nodules meeting criteria for imaging follow-up by size. Normal esophagus. No pathologically enlarged axillary, supraclavicular, mediastinal, or hilar lymph nodes. Lungs/Pleura: The central airways are patent. Layering secretions within the trachea. No focal consolidation. Lateral right apical nodule measures 4 x 3 mm (6:9 no pneumothorax. No pleural effusion. Musculoskeletal: No acute or abnormal lytic or blastic osseous lesions. Review of the MIP images confirms the above findings. CTA ABDOMEN AND PELVIS FINDINGS VASCULAR Aorta: Aortic atherosclerosis. Normal caliber aorta without aneurysm, dissection, vasculitis or significant stenosis. Celiac: Patent without evidence of aneurysm, dissection, vasculitis or significant stenosis. SMA: Patent without evidence of aneurysm, dissection, vasculitis or significant stenosis. Renals: Single renal arteries. Moderate narrowing of the right renal artery origin and mild narrowing of the left renal artery origin. No evidence of aneurysm, dissection, vasculitis, or fibromuscular dysplasia. IMA: Patent without evidence of aneurysm, dissection, vasculitis or significant stenosis. Inflow: Segmental mild-to-moderate narrowing due to atherosclerotic plaque. No evidence of aneurysm, dissection, or vasculitis. Proximal Outflow: Bilateral common femoral and visualized portions of the superficial and profunda femoral arteries are patent without evidence of aneurysm, dissection, vasculitis or significant stenosis. Veins: No obvious venous abnormality within the limitations of this arterial phase study. Review of the MIP images confirms the above findings. NON-VASCULAR Hepatobiliary: No focal hepatic lesions. No intra or extrahepatic biliary ductal dilation. Normal  gallbladder. Pancreas: No focal lesions or main ductal dilation. Spleen: Normal in size without focal abnormality. Adrenals/Urinary Tract: No adrenal nodules. No suspicious renal mass, calculi or hydronephrosis. No focal  bladder wall thickening. Stomach/Bowel: Normal appearance of the stomach. No evidence of bowel wall thickening, distention, or inflammatory changes. Colonic diverticulosis without acute diverticulitis. Normal appendix. Lymphatic: No enlarged abdominal or pelvic lymph nodes. Reproductive: No adnexal masses. Other: No free fluid, fluid collection, or free air. Musculoskeletal: No acute or abnormal lytic or blastic osseous lesions. Review of the MIP images confirms the above findings. IMPRESSION: 1. No evidence of acute aortic syndrome. 2. No acute abnormality in the chest, abdomen, or pelvis. 3. Extensive calcified and noncalcified atherosclerotic plaque. Aortic Atherosclerosis (ICD10-I70.0). Coronary artery calcifications. Assessment for potential risk factor modification, dietary therapy or pharmacologic therapy may be warranted, if clinically indicated. 4. Right apical 4 x 3 mm pulmonary nodule. No follow-up needed if patient is low-risk.This recommendation follows the consensus statement: Guidelines for Management of Incidental Pulmonary Nodules Detected on CT Images: From the Fleischner Society 2017; Radiology 2017; 284:228-243. Electronically Signed   By: Darrin Nipper M.D.   On: 12/19/2022 13:21   US Abdomen Limited RUQ (LIVER/GB)  Result Date: 12/19/2022 CLINICAL DATA:  Right upper quadrant pain. EXAM: ULTRASOUND ABDOMEN LIMITED RIGHT UPPER QUADRANT COMPARISON:  None Available. FINDINGS: Gallbladder: Gallbladder wall thickening measuring up to 5 mm. Per the performing sonographer, there was a positive sonographic Murphy sign. There is no evidence of cholelithiasis. Common bile duct: Diameter: 2 mm, which is normal Liver: No focal lesion identified. Within normal limits in parenchymal echogenicity. Portal vein is patent on color Doppler imaging with normal direction of blood flow towards the liver. Other: None. IMPRESSION: Gallbladder wall thickening with positive sonographic Murphy sign. No evidence of  cholelithiasis. Findings are equivocal for acute cholecystitis. Electronically Signed   By: Marin Roberts M.D.   On: 12/19/2022 11:59    Microbiology: Results for orders placed or performed during the hospital encounter of 03/25/20  SARS Coronavirus 2 by RT PCR (hospital order, performed in [facility] hospital lab) Nasopharyngeal Nasopharyngeal Swab     Status: None   Collection Time: 03/25/20  4:42 PM   Specimen: Nasopharyngeal Swab  Result Value Ref Range Status   SARS Coronavirus 2 NEGATIVE NEGATIVE Final    Comment: (NOTE) SARS-CoV-2 target nucleic acids are NOT DETECTED.  The SARS-CoV-2 RNA is generally detectable in upper and lower respiratory specimens during the acute phase of infection. The lowest concentration of SARS-CoV-2 viral copies this assay can detect is 250 copies / mL. A negative result does not preclude SARS-CoV-2 infection and should not be used as the sole basis for treatment or other patient management decisions.  A negative result may occur with improper specimen collection / handling, submission of specimen other than nasopharyngeal swab, presence of viral mutation(s) within the areas targeted by this assay, and inadequate number of viral copies (<250 copies / mL). A negative result must be combined with clinical observations, patient history, and epidemiological information.  Fact Sheet for Patients:   StrictlyIdeas.no  Fact Sheet for Healthcare Providers: BankingDealers.co.za  This test is not yet approved or  cleared by the [place] FDA and has been authorized for detection and/or diagnosis of SARS-CoV-2 by FDA under an Emergency Use Authorization (EUA).  This EUA will remain in effect (meaning this test can be used) for the duration of the COVID-19 declaration under Section 564(b)(1) of the Act, 21 U.S.C. section 360bbb-3(b)(1), unless the  authorization is terminated or revoked sooner.  Performed at  [facility], [address] [place], [place] [address]     Labs: CBC: Recent Labs  Lab 12/19/22 0945 12/20/22 0423  WBC 5.0 4.1  HGB 15.7* 12.4  HCT 46.7* 38.8  MCV 100.9* 104.3*  PLT 248 0000000   Basic Metabolic Panel: Recent Labs  Lab 12/19/22 0945 12/20/22 0423 12/21/22 0955  NA 133* 137 137  K 3.7 3.7 4.5  CL 96* 106 108  CO2 26 23 23   GLUCOSE 95 88 98  BUN 25* 20 12  CREATININE 1.21* 1.11* 1.18*  CALCIUM 10.2 8.8* 9.0  MG  --  1.7  --    Liver Function Tests: Recent Labs  Lab 12/19/22 0945 12/20/22 0423  AST 24 18  ALT 26 20  ALKPHOS 97 67  BILITOT 0.8 0.8  PROT 8.3* 6.2*  ALBUMIN 4.6 3.7   CBG: No results for input(s): "GLUCAP" in the last 168 hours.  Discharge time spent: greater than 30 minutes.  This record has been created using Systems analyst. Errors have been sought and corrected,but may not always be located. Such creation errors do not reflect on the standard of care.   Signed: Lorella Nimrod, MD [facility] 12/21/2022

## 2023-01-29 ENCOUNTER — Emergency Department (HOSPITAL_BASED_OUTPATIENT_CLINIC_OR_DEPARTMENT_OTHER): Payer: Medicare Other

## 2023-01-29 ENCOUNTER — Other Ambulatory Visit: Payer: Self-pay

## 2023-01-29 ENCOUNTER — Observation Stay (HOSPITAL_BASED_OUTPATIENT_CLINIC_OR_DEPARTMENT_OTHER)
Admission: EM | Admit: 2023-01-29 | Discharge: 2023-01-31 | Disposition: A | Discharge status: Home / Self Care | Payer: Medicare Other | Attending: Internal Medicine | Admitting: Internal Medicine

## 2023-01-29 ENCOUNTER — Encounter (HOSPITAL_BASED_OUTPATIENT_CLINIC_OR_DEPARTMENT_OTHER): Payer: Self-pay | Admitting: Emergency Medicine

## 2023-01-29 DIAGNOSIS — I639 Cerebral infarction, unspecified: Principal | ICD-10-CM | POA: Diagnosis present

## 2023-01-29 DIAGNOSIS — I10 Essential (primary) hypertension: Secondary | ICD-10-CM | POA: Diagnosis present

## 2023-01-29 DIAGNOSIS — Z7982 Long term (current) use of aspirin: Secondary | ICD-10-CM | POA: Diagnosis not present

## 2023-01-29 DIAGNOSIS — E785 Hyperlipidemia, unspecified: Secondary | ICD-10-CM | POA: Diagnosis present

## 2023-01-29 DIAGNOSIS — R0602 Shortness of breath: Secondary | ICD-10-CM | POA: Insufficient documentation

## 2023-01-29 DIAGNOSIS — R29701 NIHSS score 1: Secondary | ICD-10-CM | POA: Insufficient documentation

## 2023-01-29 DIAGNOSIS — Z79899 Other long term (current) drug therapy: Secondary | ICD-10-CM | POA: Diagnosis not present

## 2023-01-29 DIAGNOSIS — Z87891 Personal history of nicotine dependence: Secondary | ICD-10-CM | POA: Diagnosis not present

## 2023-01-29 DIAGNOSIS — R200 Anesthesia of skin: Secondary | ICD-10-CM

## 2023-01-29 LAB — COMPREHENSIVE METABOLIC PANEL
ALT: 22 U/L (ref 0–44)
AST: 26 U/L (ref 15–41)
Albumin: 4.1 g/dL (ref 3.5–5.0)
Alkaline Phosphatase: 74 U/L (ref 38–126)
Anion gap: 10 (ref 5–15)
BUN: 17 mg/dL (ref 8–23)
CO2: 23 mmol/L (ref 22–32)
Calcium: 8.9 mg/dL (ref 8.9–10.3)
Chloride: 105 mmol/L (ref 98–111)
Creatinine, Ser: 1.2 mg/dL — ABNORMAL HIGH (ref 0.44–1.00)
GFR, Estimated: 49 mL/min — ABNORMAL LOW (ref >60)
Glucose, Bld: 89 mg/dL (ref 70–99)
Potassium: 4.1 mmol/L (ref 3.5–5.1)
Sodium: 138 mmol/L (ref 135–145)
Total Bilirubin: 0.4 mg/dL (ref 0.3–1.2)
Total Protein: 7 g/dL (ref 6.5–8.1)

## 2023-01-29 LAB — CBC WITH DIFFERENTIAL/PLATELET
Abs Immature Granulocytes: 0.01 10*3/uL (ref 0.00–0.07)
Basophils Absolute: 0 10*3/uL (ref 0.0–0.1)
Basophils Relative: 1 %
Eosinophils Absolute: 0.1 10*3/uL (ref 0.0–0.5)
Eosinophils Relative: 2 %
HCT: 38 % (ref 36.0–46.0)
Hemoglobin: 12.8 g/dL (ref 12.0–15.0)
Immature Granulocytes: 0 %
Lymphocytes Relative: 31 %
Lymphs Abs: 1.5 10*3/uL (ref 0.7–4.0)
MCH: 33.9 pg (ref 26.0–34.0)
MCHC: 33.7 g/dL (ref 30.0–36.0)
MCV: 100.5 fL — ABNORMAL HIGH (ref 80.0–100.0)
Monocytes Absolute: 0.5 10*3/uL (ref 0.1–1.0)
Monocytes Relative: 9 %
Neutro Abs: 2.8 10*3/uL (ref 1.7–7.7)
Neutrophils Relative %: 57 %
Platelets: 172 10*3/uL (ref 150–400)
RBC: 3.78 MIL/uL — ABNORMAL LOW (ref 3.87–5.11)
RDW: 13.5 % (ref 11.5–15.5)
WBC: 4.8 10*3/uL (ref 4.0–10.5)
nRBC: 0 % (ref 0.0–0.2)

## 2023-01-29 LAB — BRAIN NATRIURETIC PEPTIDE: B Natriuretic Peptide: 376.8 pg/mL — ABNORMAL HIGH (ref 0.0–100.0)

## 2023-01-29 LAB — TSH: TSH: 0.679 u[IU]/mL (ref 0.350–4.500)

## 2023-01-29 LAB — CBG MONITORING, ED: Glucose-Capillary: 92 mg/dL (ref 70–99)

## 2023-01-29 LAB — TROPONIN I (HIGH SENSITIVITY)
Troponin I (High Sensitivity): <2 ng/L (ref <18)
Troponin I (High Sensitivity): 2 ng/L (ref <18)

## 2023-01-29 MED ORDER — IOHEXOL 350 MG/ML SOLN
75.0000 mL | Freq: Once | INTRAVENOUS | Status: AC | PRN
  Administered 2023-01-29: 75 mL via INTRAVENOUS

## 2023-01-29 NOTE — ED Notes (Signed)
[facility] called for transport. 

## 2023-01-29 NOTE — ED Triage Notes (Signed)
Patient arrives via [facility] for mri

## 2023-01-29 NOTE — ED Notes (Signed)
X-ray at bedside

## 2023-01-29 NOTE — ED Triage Notes (Signed)
Pt with numbness to RUE since 0700; sts BP is elevated and she is compliant with medication

## 2023-01-29 NOTE — ED Notes (Signed)
Pt's CBG 92. Rn notified.

## 2023-01-29 NOTE — ED Provider Notes (Signed)
[facility] Provider Note   CSN: [id number] Arrival date & time: 01/29/23  1559     History  Chief Complaint  Patient presents with   Numbness    [facility] Sparks is a 72 y.o. female.  The history is provided by the patient and medical records. No language interpreter was used.  Neurologic Problem This is a new problem. The current episode started 6 to 12 hours ago. The problem occurs constantly. Pertinent negatives include no chest pain (resolved), no abdominal pain, no headaches and no shortness of breath (resolved). Nothing aggravates the symptoms. Nothing relieves the symptoms. She has tried nothing for the symptoms. The treatment provided no relief.       Home Medications Prior to Admission medications   Medication Sig Start Date End Date Taking? Authorizing Provider  albuterol (VENTOLIN HFA) 108 (90 Base) MCG/ACT inhaler Inhale 2 puffs into the lungs daily as needed for wheezing or shortness of breath.  12/18/19   [provider]  aspirin 81 MG tablet Take 81 mg by mouth 2 (two) times a week. Takes twice a week    [provider]  BREZTRI AEROSPHERE 160-9-4.8 MCG/ACT AERO Take 2 puffs by mouth in the morning and at bedtime. 10/29/22   [provider]  dicyclomine (BENTYL) 10 MG capsule Take 1 capsule (10 mg total) by mouth 2 (two) times daily as needed for spasms. 12/21/22   Arnetha Courser, MD  ezetimibe-simvastatin (VYTORIN) 10-20 MG tablet Take 1 tablet by mouth every evening.    [provider]  fluticasone (FLONASE) 50 MCG/ACT nasal spray Place 1-2 sprays into both nostrils daily as needed for allergies. 03/07/22   [provider]  hydrochlorothiazide (HYDRODIURIL) 25 MG tablet Take 25 mg by mouth every morning. 12/04/22   [provider]  olmesartan (BENICAR) 20 MG tablet Take 20 mg by mouth daily.    [provider]  omeprazole (PRILOSEC) 20 MG capsule Take 1 capsule (20 mg  total) by mouth daily. 12/21/22   Arnetha Courser, MD      Allergies    Dilaudid [hydromorphone], Doxycycline, and Strawberry (diagnostic)    Review of Systems   Review of Systems  Constitutional:  Positive for fatigue. Negative for chills and fever.  HENT:  Negative for congestion.   Eyes:  Negative for visual disturbance.  Respiratory:  Negative for cough, chest tightness and shortness of breath (resolved).   Cardiovascular:  Negative for chest pain (resolved), palpitations and leg swelling.  Gastrointestinal:  Negative for abdominal pain, constipation, diarrhea, nausea and vomiting.  Genitourinary:  Negative for dysuria.  Musculoskeletal:  Negative for back pain, neck pain and neck stiffness.  Skin:  Negative for rash and wound.  Neurological:  Positive for numbness. Negative for weakness, light-headedness and headaches.  Psychiatric/Behavioral:  Negative for agitation and confusion.   All other systems reviewed and are negative.   Physical Exam Updated Vital Signs BP (!) 214/112 (BP Location: Left Arm)   Pulse 91   Temp 97.6 F (36.4 C) (Oral)   Resp 20   Ht 5' 5.5" (1.664 m)   Wt 72.6 kg   SpO2 99%   BMI 26.22 kg/m  Physical Exam Vitals and nursing note reviewed.  Constitutional:      General: She is not in acute distress.    Appearance: She is well-developed. She is not ill-appearing, toxic-appearing or diaphoretic.  HENT:     Head: Normocephalic and atraumatic.     Nose: Nose  normal.     Mouth/Throat:     Mouth: Mucous membranes are moist.  Eyes:     Extraocular Movements: Extraocular movements intact.     Conjunctiva/sclera: Conjunctivae normal.     Pupils: Pupils are equal, round, and reactive to light.  Neck:     Vascular: No carotid bruit.  Cardiovascular:     Rate and Rhythm: Normal rate and regular rhythm.     Heart sounds: No murmur heard. Pulmonary:     Effort: Pulmonary effort is normal. No respiratory distress.     Breath sounds: Normal breath  sounds. No wheezing, rhonchi or rales.  Chest:     Chest wall: No tenderness.  Abdominal:     General: Abdomen is flat.     Palpations: Abdomen is soft.     Tenderness: There is no abdominal tenderness. There is no guarding or rebound.  Musculoskeletal:        General: No swelling or tenderness.     Cervical back: Neck supple. No tenderness.  Skin:    General: Skin is warm and dry.     Capillary Refill: Capillary refill takes less than 2 seconds.     Findings: No erythema.  Neurological:     Mental Status: She is alert.     Sensory: Sensory deficit present.     Motor: No weakness.  Psychiatric:        Mood and Affect: Mood normal.     ED Results / Procedures / Treatments   Labs (all labs ordered are listed, but only abnormal results are displayed) Labs Reviewed  CBC WITH DIFFERENTIAL/PLATELET - Abnormal; Notable for the following components:      Result Value   RBC 3.78 (*)    MCV 100.5 (*)    All other components within normal limits  COMPREHENSIVE METABOLIC PANEL - Abnormal; Notable for the following components:   Creatinine, Ser 1.20 (*)    GFR, Estimated 49 (*)    All other components within normal limits  BRAIN NATRIURETIC PEPTIDE - Abnormal; Notable for the following components:   B Natriuretic Peptide 376.8 (*)    All other components within normal limits  TSH  CBG MONITORING, ED  TROPONIN I (HIGH SENSITIVITY)  TROPONIN I (HIGH SENSITIVITY)    EKG EKG Interpretation  Date/Time:  Saturday Jan 29 2023 16:08:01 EDT Ventricular Rate:  81 PR Interval:  176 QRS Duration: 84 QT Interval:  413 QTC Calculation: 480 R Axis:   27 Text Interpretation: Sinus rhythm Right atrial enlargement when compraed to prior, overall similar appearance. No STEMI Confirmed by Theda Belfast ([id number]) on 01/29/2023 4:38:47 PM  Radiology CT ANGIO HEAD NECK W WO CM  Result Date: 01/29/2023 CLINICAL DATA:  Acute neurologic deficit EXAM: CT ANGIOGRAPHY HEAD AND NECK WITH AND WITHOUT  CONTRAST TECHNIQUE: Multidetector CT imaging of the head and neck was performed using the standard protocol during bolus administration of intravenous contrast. Multiplanar CT image reconstructions and MIPs were obtained to evaluate the vascular anatomy. Carotid stenosis measurements (when applicable) are obtained utilizing NASCET criteria, using the distal internal carotid diameter as the denominator. RADIATION DOSE REDUCTION: This exam was performed according to the departmental dose-optimization program which includes automated exposure control, adjustment of the mA and/or kV according to patient size and/or use of iterative reconstruction technique. CONTRAST:  75mL OMNIPAQUE IOHEXOL 350 MG/ML SOLN COMPARISON:  None Available. FINDINGS: CT HEAD FINDINGS Brain: There is no mass, hemorrhage or extra-axial collection. The size and configuration of the ventricles and extra-axial  CSF spaces are normal. There is no acute or chronic infarction. The brain parenchyma is normal. Skull: The visualized skull base, calvarium and extracranial soft tissues are normal. Sinuses/Orbits: No fluid levels or advanced mucosal thickening of the visualized paranasal sinuses. No mastoid or middle ear effusion. The orbits are normal. CTA NECK FINDINGS SKELETON: There is no bony spinal canal stenosis. No lytic or blastic lesion. OTHER NECK: Normal pharynx, larynx and major salivary glands. No cervical lymphadenopathy. Unremarkable thyroid gland. UPPER CHEST: No pneumothorax or pleural effusion. No nodules or masses. AORTIC ARCH: There is calcific atherosclerosis of the aortic arch. There is no aneurysm, dissection or hemodynamically significant stenosis of the visualized portion of the aorta. Conventional 3 vessel aortic branching pattern. The visualized proximal subclavian arteries are widely patent. RIGHT CAROTID SYSTEM: No dissection, occlusion or aneurysm. Mild atherosclerotic calcification at the carotid bifurcation without  hemodynamically significant stenosis. LEFT CAROTID SYSTEM: No dissection, occlusion or aneurysm. Mild atherosclerotic calcification at the carotid bifurcation without hemodynamically significant stenosis. VERTEBRAL ARTERIES: Codominant configuration. Both origins are clearly patent. There is no dissection, occlusion or flow-limiting stenosis to the skull base (V1-V3 segments). CTA HEAD FINDINGS POSTERIOR CIRCULATION: --Vertebral arteries: Normal V4 segments. --Inferior cerebellar arteries: Normal. --Basilar artery: Normal. --Superior cerebellar arteries: Normal. --Posterior cerebral arteries (PCA): Normal. ANTERIOR CIRCULATION: --Intracranial internal carotid arteries: Atherosclerotic calcification of the internal carotid arteries at the skull base without hemodynamically significant stenosis. --Anterior cerebral arteries (ACA): Normal. Both A1 segments are present. Patent anterior communicating artery (a-comm). --Middle cerebral arteries (MCA): Normal. VENOUS SINUSES: As permitted by contrast timing, patent. ANATOMIC VARIANTS: Fetal origin of the right posterior cerebral artery. Review of the MIP images confirms the above findings. IMPRESSION: 1. No emergent large vessel occlusion or hemodynamically significant stenosis of the head or neck. 2. Mild bilateral carotid bifurcation atherosclerosis without hemodynamically significant stenosis. Aortic atherosclerosis (ICD10-I70.0). Electronically Signed   By: Deatra Robinson M.D.   On: 01/29/2023 19:36   DG Chest Portable 1 View  Result Date: 01/29/2023 CLINICAL DATA:  Shortness of breath. EXAM: PORTABLE CHEST 1 VIEW COMPARISON:  Chest x-ray August 02, 2022 FINDINGS: The heart size and mediastinal contours are within normal limits. Both lungs are clear. The visualized skeletal structures are unremarkable. IMPRESSION: No active disease. Electronically Signed   By: Gerome Sam III M.D.   On: 01/29/2023 17:04    Procedures Procedures    Medications Ordered in  ED Medications  iohexol (OMNIPAQUE) 350 MG/ML injection 75 mL (75 mLs Intravenous Contrast Given 01/29/23 1738)    ED Course/ Medical Decision Making/ A&P                             Medical Decision Making Amount and/or Complexity of Data Reviewed Labs: ordered. Radiology: ordered.  Risk Prescription drug management.    [facility] Sparks is a 72 y.o. female with a past medical history significant for hypertension, hyperlipidemia, previous hysterectomy, and diverticulosis who presents with 1 week of mild shortness of breath, 1 second of sharp left chest discomfort this morning, and right arm numbness since 7 AM.  According to patient, she woke at 6 AM with no symptoms aside from a week of mild shortness of breath.  She reports that her blood pressure has been up and down and was actually in the 90s last week.  She saw her doctor and they decided to make no changes to blood pressure medicine and watch it.  Her blood pressure has been  higher the last few days.  She says that she had no numbness when she woke up but around 7 AM started noticing numbness in her right arm.  There is no numbness in her legs face and she denies any vision changes speech difficulties or dizziness.  She denies any neck pain or headache and denies recent trauma or neck injury.  She denies any family history of multiple sclerosis and has no stroke history to her knowledge.  She denies any other trauma.  She denies any nausea, vomiting, constipation, diarrhea, or urinary changes.  Denies any leg pain or leg swelling.  Denies any cardiac disease.  Patient felt 1 second of sharp chest discomfort in her left chest around 730 but has not had any more discomfort since.  On exam, lungs clear and chest nontender.  Abdomen nontender.  Good pulses in extremities.  She has intact sensation and strength in lower extremities and had intact sensation and strength in her left arm.  Her right arm had significant numbness but had intact grip  strength.  Good pulses throughout.  No carotid bruit.  Symmetric smile.  Clear speech.  Pupils are symmetric and reactive with normal extraocular movements.  No neck stiffness or neck tenderness.  I quickly called and spoke to neurology to discuss management.  She is outside of the tPA window at this time and only has isolated numbness.  I am somewhat concerned about acute stroke.  We discussed her blood pressure being over 200 systolic initially and we will allow some permissive hypertension so will not treat her blood pressure initially.  Will get CTA of the head and neck as we do not have MRI available at this facility today.  She will ultimately need MRI without contrast.  Low suspicion for MS after speak with neurologist we will hold on with and without MRI.  Will get other screening labs and workup for the chest pain and shortness of breath.  Anticipate workup completion here then transfer ED to ED to get the MRI.  Anticipate disposition based on workup results.  CTA showed no evidence of LVO.  Per neurology, she will be ED to ED transfer for MRI brain without contrast.  BNP slightly elevated but otherwise workup overall reassuring.  Troponin negative.  DSA still in process.  Chest x-ray shows no fluid or other evidence of cardiopulmonary disease.  Blood pressure still around 200 systolic.  Until stroke is ruled out, will hold on aggressive blood pressure treatment.  Patient with transfer ED to ED.  Patient accepted for ED to ED transfer by Dr. Dalene Seltzer.  Anticipate reassessment after MRI results to determine disposition.  If MRI is negative, anticipate discharge home and blood pressure management.         Final Clinical Impression(s) / ED Diagnoses Final diagnoses:  Right arm numbness    Clinical Impression: 1. Right arm numbness     Disposition: ED to ED transfer to get MRIs tonight to rule out acute stroke as cause of right arm numbness.  This note was prepared with  assistance of Conservation officer, historic buildings. Occasional wrong-word or sound-a-like substitutions may have occurred due to the inherent limitations of voice recognition software.      Jakalyn Kratky, Canary Brim, MD 01/29/23 [phone]

## 2023-01-29 NOTE — ED Notes (Signed)
Pt ambulatory to bathroom, no assistance needed.  

## 2023-01-30 ENCOUNTER — Emergency Department (HOSPITAL_COMMUNITY): Payer: Medicare Other

## 2023-01-30 ENCOUNTER — Observation Stay (HOSPITAL_BASED_OUTPATIENT_CLINIC_OR_DEPARTMENT_OTHER): Payer: Medicare Other

## 2023-01-30 DIAGNOSIS — I6389 Other cerebral infarction: Secondary | ICD-10-CM | POA: Diagnosis not present

## 2023-01-30 DIAGNOSIS — E782 Mixed hyperlipidemia: Secondary | ICD-10-CM | POA: Diagnosis not present

## 2023-01-30 DIAGNOSIS — I639 Cerebral infarction, unspecified: Secondary | ICD-10-CM | POA: Diagnosis present

## 2023-01-30 DIAGNOSIS — I10 Essential (primary) hypertension: Secondary | ICD-10-CM | POA: Diagnosis not present

## 2023-01-30 DIAGNOSIS — R200 Anesthesia of skin: Secondary | ICD-10-CM

## 2023-01-30 LAB — ECHOCARDIOGRAM COMPLETE BUBBLE STUDY
AR max vel: 1.92 cm2
AV Area VTI: 1.83 cm2
AV Area mean vel: 1.86 cm2
AV Mean grad: 3 mmHg
AV Peak grad: 6.2 mmHg
Ao pk vel: 1.24 m/s
Area-P 1/2: 4.17 cm2
Calc EF: 60.9 %
Height: 65 in
S' Lateral: 2.4 cm
Single Plane A2C EF: 61.4 %
Single Plane A4C EF: 62.4 %
Weight: 2557.34 oz

## 2023-01-30 LAB — LIPID PANEL
Cholesterol: 240 mg/dL — ABNORMAL HIGH (ref 0–200)
HDL: 118 mg/dL (ref >40)
LDL Cholesterol: 111 mg/dL — ABNORMAL HIGH (ref 0–99)
Total CHOL/HDL Ratio: 2 RATIO
Triglycerides: 57 mg/dL (ref <150)
VLDL: 11 mg/dL (ref 0–40)

## 2023-01-30 LAB — HEMOGLOBIN A1C
Hgb A1c MFr Bld: 6.2 % — ABNORMAL HIGH (ref 4.8–5.6)
Mean Plasma Glucose: 131.24 mg/dL

## 2023-01-30 MED ORDER — EZETIMIBE 10 MG PO TABS: 10.0000 mg | ORAL_TABLET | Freq: Every evening | ORAL | Status: DC

## 2023-01-30 MED ORDER — ROSUVASTATIN CALCIUM 20 MG PO TABS
20.0000 mg | ORAL_TABLET | Freq: Every day | ORAL | Status: DC
  Administered 2023-01-30 – 2023-01-31 (×2): 20 mg via ORAL
  Filled 2023-01-30: qty 1

## 2023-01-30 MED ORDER — CLOPIDOGREL BISULFATE 75 MG PO TABS: 75.0000 mg | ORAL_TABLET | Freq: Every day | ORAL | Status: DC

## 2023-01-30 MED ORDER — ALBUTEROL SULFATE (2.5 MG/3ML) 0.083% IN NEBU: 2.5000 mg | INHALATION_SOLUTION | Freq: Every day | RESPIRATORY_TRACT | Status: DC | PRN

## 2023-01-30 MED ORDER — SODIUM CHLORIDE 0.9 % IV SOLN: INTRAVENOUS | Status: DC

## 2023-01-30 MED ORDER — ACETAMINOPHEN 650 MG RE SUPP: 650.0000 mg | RECTAL | Status: DC | PRN

## 2023-01-30 MED ORDER — ACETAMINOPHEN 160 MG/5ML PO SOLN: 650.0000 mg | ORAL | Status: DC | PRN

## 2023-01-30 MED ORDER — LORAZEPAM 2 MG/ML IJ SOLN
1.0000 mg | Freq: Once | INTRAMUSCULAR | Status: AC
  Administered 2023-01-30: 1 mg via INTRAVENOUS
  Filled 2023-01-30: qty 1

## 2023-01-30 MED ORDER — PANTOPRAZOLE SODIUM 40 MG PO TBEC
40.0000 mg | DELAYED_RELEASE_TABLET | Freq: Every day | ORAL | Status: DC
  Administered 2023-01-30 – 2023-01-31 (×2): 40 mg via ORAL
  Filled 2023-01-30: qty 1

## 2023-01-30 MED ORDER — TRAZODONE HCL 50 MG PO TABS: 25.0000 mg | ORAL_TABLET | Freq: Every evening | ORAL | Status: DC | PRN

## 2023-01-30 MED ORDER — ASPIRIN 81 MG PO TABS: 81.0000 mg | ORAL_TABLET | ORAL | Status: DC

## 2023-01-30 MED ORDER — SIMVASTATIN 20 MG PO TABS: 20.0000 mg | ORAL_TABLET | Freq: Every day | ORAL | Status: DC

## 2023-01-30 MED ORDER — ASPIRIN 81 MG PO TBEC
81.0000 mg | DELAYED_RELEASE_TABLET | Freq: Every day | ORAL | Status: DC
  Administered 2023-01-30 – 2023-01-31 (×2): 81 mg via ORAL
  Filled 2023-01-30: qty 1

## 2023-01-30 MED ORDER — HYDROCHLOROTHIAZIDE 25 MG PO TABS
25.0000 mg | ORAL_TABLET | Freq: Every morning | ORAL | Status: DC
  Administered 2023-01-30 – 2023-01-31 (×2): 25 mg via ORAL
  Filled 2023-01-30: qty 1

## 2023-01-30 MED ORDER — ACETAMINOPHEN 325 MG PO TABS: 650.0000 mg | ORAL_TABLET | ORAL | Status: DC | PRN

## 2023-01-30 MED ORDER — ENOXAPARIN SODIUM 40 MG/0.4ML IJ SOSY
40.0000 mg | PREFILLED_SYRINGE | INTRAMUSCULAR | Status: DC
  Administered 2023-01-30 – 2023-01-31 (×2): 40 mg via SUBCUTANEOUS
  Filled 2023-01-30: qty 0.4

## 2023-01-30 MED ORDER — SENNOSIDES-DOCUSATE SODIUM 8.6-50 MG PO TABS: 1.0000 | ORAL_TABLET | Freq: Every evening | ORAL | Status: DC | PRN

## 2023-01-30 MED ORDER — CLOPIDOGREL BISULFATE 75 MG PO TABS
75.0000 mg | ORAL_TABLET | Freq: Every day | ORAL | Status: DC
  Administered 2023-01-30 – 2023-01-31 (×2): 75 mg via ORAL
  Filled 2023-01-30: qty 1

## 2023-01-30 MED ORDER — IRBESARTAN 75 MG PO TABS
37.5000 mg | ORAL_TABLET | Freq: Every day | ORAL | Status: DC
  Administered 2023-01-30 – 2023-01-31 (×2): 37.5 mg via ORAL
  Filled 2023-01-30 (×2): qty 0.5

## 2023-01-30 MED ORDER — EZETIMIBE-SIMVASTATIN 10-20 MG PO TABS: 1.0000 | ORAL_TABLET | Freq: Every evening | ORAL | Status: DC

## 2023-01-30 MED ORDER — STROKE: EARLY STAGES OF RECOVERY BOOK
Freq: Once | Status: AC
  Filled 2023-01-30: qty 1

## 2023-01-30 MED ORDER — DICYCLOMINE HCL 10 MG PO CAPS: 10.0000 mg | ORAL_CAPSULE | Freq: Two times a day (BID) | ORAL | Status: DC | PRN

## 2023-01-30 MED ORDER — ONDANSETRON HCL 4 MG/2ML IJ SOLN: 4.0000 mg | INTRAMUSCULAR | Status: DC | PRN

## 2023-01-30 MED ORDER — FLUTICASONE PROPIONATE 50 MCG/ACT NA SUSP: 1.0000 | Freq: Every day | NASAL | Status: DC | PRN

## 2023-01-30 NOTE — Plan of Care (Signed)

## 2023-01-30 NOTE — Progress Notes (Addendum)
PT Cancellation Note  Patient Details Name: [facility] MRN: [id number] DOB: Sep 10, 1951   Cancelled Treatment:    Reason Eval/Treat Not Completed: Patient at procedure or test/unavailable, pt transferring to unit, will evaluate once completed.   Vickki Muff, PT, DPT   Acute Rehabilitation Department Office [phone] Secure Chat Communication Preferred   Ronnie Derby 01/30/2023, 11:15 AM

## 2023-01-30 NOTE — Progress Notes (Signed)
STROKE TEAM PROGRESS NOTE   SUBJECTIVE (INTERVAL HISTORY) Her church administrator is at the bedside.  Overall her condition is rapidly improving. She still has some right hand numbness but much improved, denies any right face or leg numbness, no speech or vision difficulty.    OBJECTIVE Temp:  [97.6 F (36.4 C)-98.6 F (37 C)] 98 F (36.7 C) (05/12 1415) Pulse Rate:  [74-96] 87 (05/12 1415) Resp:  [15-27] 15 (05/12 1415) BP: (146-215)/(81-119) 157/93 (05/12 1415) SpO2:  [95 %-100 %] 97 % (05/12 1415) Weight:  [72.5 kg-72.6 kg] 72.5 kg (05/12 0001)  Recent Labs  Lab 01/29/23 1632  GLUCAP 92   Recent Labs  Lab 01/29/23 1637  NA 138  K 4.1  CL 105  CO2 23  GLUCOSE 89  BUN 17  CREATININE 1.20*  CALCIUM 8.9   Recent Labs  Lab 01/29/23 1637  AST 26  ALT 22  ALKPHOS 74  BILITOT 0.4  PROT 7.0  ALBUMIN 4.1   Recent Labs  Lab 01/29/23 1637  WBC 4.8  NEUTROABS 2.8  HGB 12.8  HCT 38.0  MCV 100.5*  PLT 172   No results for input(s): "CKTOTAL", "CKMB", "CKMBINDEX", "TROPONINI" in the last 168 hours. No results for input(s): "LABPROT", "INR" in the last 72 hours. No results for input(s): "COLORURINE", "LABSPEC", "PHURINE", "GLUCOSEU", "HGBUR", "BILIRUBINUR", "KETONESUR", "PROTEINUR", "UROBILINOGEN", "NITRITE", "LEUKOCYTESUR" in the last 72 hours.  Invalid input(s): "APPERANCEUR"     Component Value Date/Time   CHOL 240 (H) 01/30/2023 0536   TRIG 57 01/30/2023 0536   HDL 118 01/30/2023 0536   CHOLHDL 2.0 01/30/2023 0536   VLDL 11 01/30/2023 0536   LDLCALC 111 (H) 01/30/2023 0536   Lab Results  Component Value Date   HGBA1C 6.2 (H) 01/30/2023   No results found for: "LABOPIA", "COCAINSCRNUR", "LABBENZ", "AMPHETMU", "THCU", "LABBARB"  No results for input(s): "ETH" in the last 168 hours.  I have personally reviewed the radiological images below and agree with the radiology interpretations.  ECHOCARDIOGRAM COMPLETE BUBBLE STUDY  Result Date: 01/30/2023     ECHOCARDIOGRAM REPORT   Patient Name:   MIRNA DEFREITAS Date of Exam: 01/30/2023 Medical Rec #:  [id number]     Height:       65.0 in Accession #:    [id number]    Weight:       159.8 lb Date of Birth:  [birth year]     BSA:          1.798 m Patient Age:    72 years      BP:           155/85 mmHg Patient Gender: F             HR:           79 bpm. Exam Location:  Inpatient Procedure: 2D Echo, Color Doppler, Cardiac Doppler and Saline Contrast Bubble            Study Indications:     Stroke  History:         Patient has no prior history of Echocardiogram examinations.                  Stroke; Risk Factors:Hypertension and Dyslipidemia.  Sonographer:     Milbert Coulter Referring Phys:  [id number] JAN A MANSY Diagnosing Phys: [place] IMPRESSIONS  1. Left ventricular ejection fraction, by estimation, is 60 to 65%. The left ventricle has normal function. The left ventricle has no regional wall motion abnormalities. Left ventricular  diastolic parameters are consistent with Grade I diastolic dysfunction (impaired relaxation).  2. Right ventricular systolic function is normal. The right ventricular size is normal. Tricuspid regurgitation signal is inadequate for assessing PA pressure.  3. The mitral valve was not well visualized. No evidence of mitral valve regurgitation.  4. The aortic valve was not well visualized. Aortic valve regurgitation is not visualized.  5. Aortic not well visualized.  6. The inferior vena cava is normal in size with greater than 50% respiratory variability, suggesting right atrial pressure of 3 mmHg. FINDINGS  Left Ventricle: Left ventricular ejection fraction, by estimation, is 60 to 65%. The left ventricle has normal function. The left ventricle has no regional wall motion abnormalities. The left ventricular internal cavity size was normal in size. There is  no left ventricular hypertrophy. Left ventricular diastolic parameters are consistent with Grade I diastolic dysfunction (impaired relaxation).  Right Ventricle: The right ventricular size is normal. Right ventricular systolic function is normal. Tricuspid regurgitation signal is inadequate for assessing PA pressure. Left Atrium: Left atrial size was normal in size. Right Atrium: Right atrial size was normal in size. Pericardium: There is no evidence of pericardial effusion. Mitral Valve: The mitral valve was not well visualized. No evidence of mitral valve regurgitation. Tricuspid Valve: Tricuspid valve regurgitation is not demonstrated. Aortic Valve: The aortic valve was not well visualized. Aortic valve regurgitation is not visualized. Aortic valve mean gradient measures 3.0 mmHg. Aortic valve peak gradient measures 6.2 mmHg. Aortic valve area, by VTI measures 1.83 cm. Pulmonic Valve: The pulmonic valve was not well visualized. Aorta: Not well visualized. Venous: The inferior vena cava is normal in size with greater than 50% respiratory variability, suggesting right atrial pressure of 3 mmHg. IAS/Shunts: The interatrial septum was not well visualized. Agitated saline contrast was given intravenously to evaluate for intracardiac shunting.  LEFT VENTRICLE PLAX 2D LVIDd:         4.00 cm     Diastology LVIDs:         2.40 cm     LV e' medial:    5.22 cm/s LV PW:         0.90 cm     LV E/e' medial:  10.1 LV IVS:        1.00 cm     LV e' lateral:   9.46 cm/s LVOT diam:     1.80 cm     LV E/e' lateral: 5.6 LV SV:         44 LV SV Index:   24 LVOT Area:     2.54 cm  LV Volumes (MOD) LV vol d, MOD A2C: 21.4 ml LV vol d, MOD A4C: 35.1 ml LV vol s, MOD A2C: 8.3 ml LV vol s, MOD A4C: 13.2 ml LV SV MOD A2C:     13.1 ml LV SV MOD A4C:     35.1 ml LV SV MOD BP:      17.4 ml RIGHT VENTRICLE RV Basal diam:  2.90 cm RV Mid diam:    2.50 cm RV S prime:     11.40 cm/s TAPSE (M-mode): 2.3 cm LEFT ATRIUM             Index       RIGHT ATRIUM          Index LA Vol (A2C):   9.4 ml  5.20 ml/m  RA Area:     9.73 cm LA Vol (A4C):   17.3 ml 9.62 ml/m  RA Volume:  20.40 ml 11.34  ml/m LA Biplane Vol: 13.3 ml 7.40 ml/m  AORTIC VALVE AV Area (Vmax):    1.92 cm AV Area (Vmean):   1.86 cm AV Area (VTI):     1.83 cm AV Vmax:           124.00 cm/s AV Vmean:          83.400 cm/s AV VTI:            0.238 m AV Peak Grad:      6.2 mmHg AV Mean Grad:      3.0 mmHg LVOT Vmax:         93.80 cm/s LVOT Vmean:        60.800 cm/s LVOT VTI:          0.171 m LVOT/AV VTI ratio: 0.72 MITRAL VALVE MV Area (PHT): 4.17 cm     SHUNTS MV Decel Time: 182 msec     Systemic VTI:  0.17 m MV E velocity: 52.90 cm/s   Systemic Diam: 1.80 cm MV A velocity: 104.00 cm/s MV E/A ratio:  0.51 Photographer signed by Carolan Clines Signature Date/Time: 01/30/2023/1:52:41 PM    Final (Updated)    VAS [place] LOWER EXTREMITY VENOUS (DVT)  Result Date: 01/30/2023  Lower Venous DVT Study Patient Name:  LESHIA ROG  Date of Exam:   01/30/2023 Medical Rec #: [id number]      Accession #:    [id number] Date of Birth: 09/27/1950      Patient Gender: F Patient Age:   77 years Exam Location:  [facility] Procedure:      VAS [place] LOWER EXTREMITY VENOUS (DVT) Referring Phys: Scheryl Marten Karlyn Glasco --------------------------------------------------------------------------------  Indications: Stroke.  Risk Factors: None identified. Limitations: Poor ultrasound/tissue interface. Comparison Study: No prior studies. Performing Technologist: Chanda Busing RVT  Examination Guidelines: A complete evaluation includes B-mode imaging, spectral Doppler, color Doppler, and power Doppler as needed of all accessible portions of each vessel. Bilateral testing is considered an integral part of a complete examination. Limited examinations for reoccurring indications may be performed as noted. The reflux portion of the exam is performed with the patient in reverse Trendelenburg.  +---------+---------------+---------+-----------+----------+-------------------+ RIGHT    CompressibilityPhasicitySpontaneityPropertiesThrombus Aging       +---------+---------------+---------+-----------+----------+-------------------+ CFV      Full           Yes      Yes                                      +---------+---------------+---------+-----------+----------+-------------------+ SFJ      Full                                                             +---------+---------------+---------+-----------+----------+-------------------+ FV Prox  Full                                                             +---------+---------------+---------+-----------+----------+-------------------+ FV Mid   Full                                                             +---------+---------------+---------+-----------+----------+-------------------+  FV DistalFull                                                             +---------+---------------+---------+-----------+----------+-------------------+ PFV      Full                                                             +---------+---------------+---------+-----------+----------+-------------------+ POP      Full           Yes      Yes                                      +---------+---------------+---------+-----------+----------+-------------------+ PTV      Full                                                             +---------+---------------+---------+-----------+----------+-------------------+ PERO                                                  Not well visualized +---------+---------------+---------+-----------+----------+-------------------+   +---------+---------------+---------+-----------+----------+--------------+ LEFT     CompressibilityPhasicitySpontaneityPropertiesThrombus Aging +---------+---------------+---------+-----------+----------+--------------+ CFV      Full           Yes      Yes                                 +---------+---------------+---------+-----------+----------+--------------+ SFJ      Full                                                         +---------+---------------+---------+-----------+----------+--------------+ FV Prox  Full                                                        +---------+---------------+---------+-----------+----------+--------------+ FV Mid                  Yes      Yes                                 +---------+---------------+---------+-----------+----------+--------------+ FV Distal               Yes      Yes                                 +---------+---------------+---------+-----------+----------+--------------+  PFV      Full                                                        +---------+---------------+---------+-----------+----------+--------------+ POP      Full           Yes      Yes                                 +---------+---------------+---------+-----------+----------+--------------+ PTV      Full                                                        +---------+---------------+---------+-----------+----------+--------------+ PERO     Full                                                        +---------+---------------+---------+-----------+----------+--------------+    Summary: RIGHT: - There is no evidence of deep vein thrombosis in the lower extremity. However, portions of this examination were limited- see technologist comments above.  - No cystic structure found in the popliteal fossa.  LEFT: - There is no evidence of deep vein thrombosis in the lower extremity. However, portions of this examination were limited- see technologist comments above.  - No cystic structure found in the popliteal fossa.  *See table(s) above for measurements and observations.    Preliminary    MR BRAIN WO CONTRAST  Result Date: 01/30/2023 CLINICAL DATA:  Acute neurologic deficit EXAM: MRI HEAD WITHOUT CONTRAST TECHNIQUE: Multiplanar, multiecho pulse sequences of the brain and surrounding structures were obtained without  intravenous contrast. COMPARISON:  None Available. FINDINGS: Brain: Small acute infarcts at the left superior frontoparietal junction. No acute or chronic hemorrhage. There is multifocal hyperintense T2-weighted signal within the white matter. Parenchymal volume and CSF spaces are normal. The midline structures are normal. Vascular: Major flow voids are preserved. Skull and upper cervical spine: Normal calvarium and skull base. Visualized upper cervical spine and soft tissues are normal. Sinuses/Orbits:No paranasal sinus fluid levels or advanced mucosal thickening. No mastoid or middle ear effusion. Normal orbits. IMPRESSION: Small acute infarcts at the left superior frontoparietal junction. No hemorrhage or mass effect. Electronically Signed   By: Deatra Robinson M.D.   On: 01/30/2023 01:34   CT ANGIO HEAD NECK W WO CM  Result Date: 01/29/2023 CLINICAL DATA:  Acute neurologic deficit EXAM: CT ANGIOGRAPHY HEAD AND NECK WITH AND WITHOUT CONTRAST TECHNIQUE: Multidetector CT imaging of the head and neck was performed using the standard protocol during bolus administration of intravenous contrast. Multiplanar CT image reconstructions and MIPs were obtained to evaluate the vascular anatomy. Carotid stenosis measurements (when applicable) are obtained utilizing NASCET criteria, using the distal internal carotid diameter as the denominator. RADIATION DOSE REDUCTION: This exam was performed according to the departmental dose-optimization program which includes automated exposure control, adjustment of the mA and/or kV according to patient size and/or use of iterative reconstruction technique.  CONTRAST:  75mL OMNIPAQUE IOHEXOL 350 MG/ML SOLN COMPARISON:  None Available. FINDINGS: CT HEAD FINDINGS Brain: There is no mass, hemorrhage or extra-axial collection. The size and configuration of the ventricles and extra-axial CSF spaces are normal. There is no acute or chronic infarction. The brain parenchyma is normal. Skull: The  visualized skull base, calvarium and extracranial soft tissues are normal. Sinuses/Orbits: No fluid levels or advanced mucosal thickening of the visualized paranasal sinuses. No mastoid or middle ear effusion. The orbits are normal. CTA NECK FINDINGS SKELETON: There is no bony spinal canal stenosis. No lytic or blastic lesion. OTHER NECK: Normal pharynx, larynx and major salivary glands. No cervical lymphadenopathy. Unremarkable thyroid gland. UPPER CHEST: No pneumothorax or pleural effusion. No nodules or masses. AORTIC ARCH: There is calcific atherosclerosis of the aortic arch. There is no aneurysm, dissection or hemodynamically significant stenosis of the visualized portion of the aorta. Conventional 3 vessel aortic branching pattern. The visualized proximal subclavian arteries are widely patent. RIGHT CAROTID SYSTEM: No dissection, occlusion or aneurysm. Mild atherosclerotic calcification at the carotid bifurcation without hemodynamically significant stenosis. LEFT CAROTID SYSTEM: No dissection, occlusion or aneurysm. Mild atherosclerotic calcification at the carotid bifurcation without hemodynamically significant stenosis. VERTEBRAL ARTERIES: Codominant configuration. Both origins are clearly patent. There is no dissection, occlusion or flow-limiting stenosis to the skull base (V1-V3 segments). CTA HEAD FINDINGS POSTERIOR CIRCULATION: --Vertebral arteries: Normal V4 segments. --Inferior cerebellar arteries: Normal. --Basilar artery: Normal. --Superior cerebellar arteries: Normal. --Posterior cerebral arteries (PCA): Normal. ANTERIOR CIRCULATION: --Intracranial internal carotid arteries: Atherosclerotic calcification of the internal carotid arteries at the skull base without hemodynamically significant stenosis. --Anterior cerebral arteries (ACA): Normal. Both A1 segments are present. Patent anterior communicating artery (a-comm). --Middle cerebral arteries (MCA): Normal. VENOUS SINUSES: As permitted by contrast  timing, patent. ANATOMIC VARIANTS: Fetal origin of the right posterior cerebral artery. Review of the MIP images confirms the above findings. IMPRESSION: 1. No emergent large vessel occlusion or hemodynamically significant stenosis of the head or neck. 2. Mild bilateral carotid bifurcation atherosclerosis without hemodynamically significant stenosis. Aortic atherosclerosis (ICD10-I70.0). Electronically Signed   By: Deatra Robinson M.D.   On: 01/29/2023 19:36   DG Chest Portable 1 View  Result Date: 01/29/2023 CLINICAL DATA:  Shortness of breath. EXAM: PORTABLE CHEST 1 VIEW COMPARISON:  Chest x-ray August 02, 2022 FINDINGS: The heart size and mediastinal contours are within normal limits. Both lungs are clear. The visualized skeletal structures are unremarkable. IMPRESSION: No active disease. Electronically Signed   By: Gerome Sam III M.D.   On: 01/29/2023 17:04     PHYSICAL EXAM  Temp:  [97.6 F (36.4 C)-98.6 F (37 C)] 98 F (36.7 C) (05/12 1415) Pulse Rate:  [74-96] 87 (05/12 1415) Resp:  [15-27] 15 (05/12 1415) BP: (146-215)/(81-119) 157/93 (05/12 1415) SpO2:  [95 %-100 %] 97 % (05/12 1415) Weight:  [72.5 kg-72.6 kg] 72.5 kg (05/12 0001)  General - Well nourished, well developed, in no apparent distress.  Ophthalmologic - fundi not visualized due to noncooperation.  Cardiovascular - Regular rhythm and rate.  Mental Status -  Level of arousal and orientation to time, place, and person were intact. Language including expression, naming, repetition, comprehension was assessed and found intact. Fund of Knowledge was assessed and was intact.  Cranial Nerves II - XII - II - Visual field intact OU. III, IV, VI - Extraocular movements intact. V - Facial sensation intact bilaterally. VII - Facial movement intact bilaterally. VIII - Hearing & vestibular intact bilaterally. X - Palate elevates  symmetrically. XI - Chin turning & shoulder shrug intact bilaterally. XII - Tongue  protrusion intact.  Motor Strength - The patient's strength was normal in all extremities and pronator drift was absent.  Bulk was normal and fasciculations were absent.   Motor Tone - Muscle tone was assessed at the neck and appendages and was normal.  Reflexes - The patient's reflexes were symmetrical in all extremities and she had no pathological reflexes.  Sensory - Light touch, temperature/pinprick were assessed and were symmetrical except right UE decreased light touch sensation.    Coordination - The patient had normal movements in the hands and feet with no ataxia or dysmetria.  Tremor was absent.  Gait and Station - deferred.   ASSESSMENT/PLAN Ms. Samyrah Pelagio is a 72 y.o. female with history of hypertension, hyperlipidemia admitted for right upper extremity numbness. No tPA given due to also Endo.    Stroke:  left frontal parietal cortical small infarcts, embolic pattern concerning for cardioembolic source CT no acute finding CTA head and neck unremarkable MRI left frontoparietal small cortical infarcts 2D Echo EF 60 to 65% LE venous Doppler no DVT Recommend loop recorder to rule out A-fib prior to discharge LDL 111 HgbA1c 6.2 Lovenox for VTE prophylaxis aspirin 81 mg daily prior to admission, now on aspirin 81 mg daily and clopidogrel 75 mg daily DAPT for 3 weeks and then Plavix alone. Patient counseled to be compliant with her antithrombotic medications Ongoing aggressive stroke risk factor management Therapy recommendations: Pending Disposition: Pending  Hypertension Stable on the high and Restart home meds including HCTZ, Avapro Long term BP goal normotensive  Hyperlipidemia Home meds Vytorin LDL 111, goal < 70 Now on Crestor 20 Continue statin at discharge  Other Stroke Risk Factors Advanced age  Other Active Problems AKI creatinine 1.20  Hospital day # 0  I spent additional 30 minutes inpatient face-to-face time with the patient, more than 50% of  which was spent in counseling and coordination of care, reviewing test results, images and medication, and discussing the diagnosis, treatment plan and potential prognosis. This patient's care requiresreview of multiple databases, neurological assessment, discussion with family, other specialists and medical decision making of high complexity.      Marvel Plan, MD PhD Stroke Neurology 01/30/2023 3:29 PM    To contact Stroke Continuity provider, please refer to [email or web address]. After hours, contact General Neurology

## 2023-01-30 NOTE — Consult Note (Signed)
NEUROLOGY CONSULTATION NOTE   Date of service: Jan 30, 2023 Patient Name: Morgan Sparks MRN:  [id number] DOB:  05-20-1951 Reason for consult: "RUE numbness" Requesting Provider: Hannah Beat, MD _ _ _   _ __   _ __ _ _  __ __   _ __   __ _  History of Present Illness  Morgan Sparks is a 72 y.o. female with PMH significant for HLD, HTN who presents with RUE numbness.  She got up at 0530 and was at the church at 0700 when R arm went numb. No prior stroke, no family hx of strokes.  Does not smoke, does not use any recreational substances.  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  LKW: 0700 mRS: 0 tNKASE: not offered, outside windwo Thrombectomy: not offered, no LVO NIHSS components Score: Comment  1a Level of Conscious 0[x]  1[]  2[]  3[]      1b LOC Questions 0[x]  1[]  2[]       1c LOC Commands 0[x]  1[]  2[]       2 Best Gaze 0[x]  1[]  2[]       3 Visual 0[x]  1[]  2[]  3[]      4 Facial Palsy 0[x]  1[]  2[]  3[]      5a Motor Arm - left 0[x]  1[]  2[]  3[]  4[]  UN[]    5b Motor Arm - Right 0[x]  1[]  2[]  3[]  4[]  UN[]    6a Motor Leg - Left 0[x]  1[]  2[]  3[]  4[]  UN[]    6b Motor Leg - Right 0[x]  1[]  2[]  3[]  4[]  UN[]    7 Limb Ataxia 0[x]  1[]  2[]  3[]  UN[]     8 Sensory 0[]  1[x]  2[]  UN[]      9 Best Language 0[x]  1[]  2[]  3[]      10 Dysarthria 0[x]  1[]  2[]  UN[]      11 Extinct. and Inattention 0[x]  1[]  2[]       TOTAL: 1       ROS   Constitutional Denies weight loss, fever and chills.   HEENT Denies changes in vision and hearing.   Respiratory Denies SOB and cough.   CV Denies palpitations and CP   GI Denies abdominal pain, nausea, vomiting and diarrhea.   GU Denies dysuria and urinary frequency.   MSK Denies myalgia and joint pain.   Skin Denies rash and pruritus.   Neurological Denies headache and syncope.   Psychiatric Denies recent changes in mood. Denies anxiety and depression.    Past History   Past Medical History:  Diagnosis Date   Diverticulitis    High  cholesterol    Past Surgical History:  Procedure Laterality Date   ABDOMINAL HYSTERECTOMY     History reviewed. No pertinent family history. Social History   Socioeconomic History   Marital status: Single    Spouse name: Not on file   Number of children: Not on file   Years of education: Not on file   Highest education level: Not on file  Occupational History   Not on file  Tobacco Use   Smoking status: Former   Smokeless tobacco: Never  Vaping Use   Vaping Use: Never used  Substance and Sexual Activity   Alcohol use: Yes    Comment: occ   Drug use: Not on file   Sexual activity: Not on file  Other Topics Concern   Not on file  Social History Narrative   Not on file   Social Determinants of Health   Financial Resource Strain: Not on file  Food Insecurity: No Food Insecurity (12/19/2022)  Hunger Vital Sign    Worried About Running Out of Food in the Last Year: Never true    Ran Out of Food in the Last Year: Never true  Transportation Needs: No Transportation Needs (12/19/2022)   PRAPARE - Administrator, Civil Service (Medical): No    Lack of Transportation (Non-Medical): No  Physical Activity: Not on file  Stress: Not on file  Social Connections: Not on file   Allergies  Allergen Reactions   Dilaudid [Hydromorphone] Itching   Doxycycline Hives   Strawberry (Diagnostic) Hives    Medications  (Not in a hospital admission)    Vitals   Vitals:   01/29/23 2254 01/29/23 2300 01/30/23 0001 01/30/23 0504  BP:  (!) 177/90 (!) 184/95 (!) 150/81  Pulse:  74 82 78  Resp:  (!) 22 (!) 21 18  Temp: 98.1 F (36.7 C)  98.1 F (36.7 C) 97.9 F (36.6 C)  TempSrc: Oral  Oral Oral  SpO2:  98% 97% 98%  Weight:   72.5 kg   Height:   5\' 5"  (1.651 m)      Body mass index is 26.6 kg/m.  Physical Exam   General: Laying comfortably in bed; in no acute distress.  HENT: Normal oropharynx and mucosa. Normal external appearance of ears and nose.  Neck:  Supple, no pain or tenderness  CV: No JVD. No peripheral edema.  Pulmonary: Symmetric Chest rise. Normal respiratory effort.  Abdomen: Soft to touch, non-tender.  Ext: No cyanosis, edema, or deformity  Skin: No rash. Normal palpation of skin.   Musculoskeletal: Normal digits and nails by inspection. No clubbing.   Neurologic Examination  Mental status/Cognition: Alert, oriented to self, place, month and year, good attention.  Speech/language: Fluent, comprehension intact, object naming intact, repetition intact.  Cranial nerves:   CN II Pupils equal and reactive to light, no VF deficits   CN III,IV,VI EOM intact, no gaze preference or deviation, no nystagmus    CN V normal sensation in V1, V2, and V3 segments bilaterally    CN VII no asymmetry, no nasolabial fold flattening    CN VIII normal hearing to speech    CN IX & X normal palatal elevation, no uvular deviation    CN XI 5/5 head turn and 5/5 shoulder shrug bilaterally    CN XII midline tongue protrusion    Motor:  Muscle bulk: normal, tone normal, pronator drift none tremor none Mvmt Root Nerve  Muscle Right Left Comments  SA C5/6 Ax Deltoid 5 5   EF C5/6 Mc Biceps 5 5   EE C6/7/8 Rad Triceps 5 5   WF C6/7 Med FCR     WE C7/8 PIN ECU     F Ab C8/T1 U ADM/FDI 5 5   HF L1/2/3 Fem Illopsoas 5 5   KE L2/3/4 Fem Quad 5 5   DF L4/5 D Peron Tib Ant 5 5   PF S1/2 Tibial Grc/Sol 5 5    Sensation:  Light touch Mildly decreased in right upper extremity to light touch.   Pin prick    Temperature    Vibration   Proprioception    Coordination/Complex Motor:  - Finger to Nose itnact BL - Heel to shin intact BL - Rapid alternating movement are normal - Gait: deferred for patient safety.  Labs   CBC:  Recent Labs  Lab 01/29/23 1637  WBC 4.8  NEUTROABS 2.8  HGB 12.8  HCT 38.0  MCV 100.5*  PLT  172    Basic Metabolic Panel:  Lab Results  Component Value Date   NA 138 01/29/2023   K 4.1 01/29/2023   CO2 23  01/29/2023   GLUCOSE 89 01/29/2023   BUN 17 01/29/2023   CREATININE 1.20 (H) 01/29/2023   CALCIUM 8.9 01/29/2023   GFRNONAA 49 (L) 01/29/2023   GFRAA >60 03/28/2020   Lipid Panel: No results found for: "LDLCALC" HgbA1c: No results found for: "HGBA1C" Urine Drug Screen: No results found for: "LABOPIA", "COCAINSCRNUR", "LABBENZ", "AMPHETMU", "THCU", "LABBARB"  Alcohol Level No results found for: "ETH"  CT Head without contrast(Personally reviewed): CTH was negative for a large hypodensity concerning for a large territory infarct or hyperdensity concerning for an ICH   CT angio Head and Neck with contrast(Personally reviewed): 1. No emergent large vessel occlusion or hemodynamically significant stenosis of the head or neck. 2. Mild bilateral carotid bifurcation atherosclerosis without hemodynamically significant stenosis.  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 No hemorrhage or mass effect.  Impression   Morgan Sparks is a 72 y.o. female with PMH significant for with PMH significant for HLD, HTN who presents with RUE numbness.  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 Her neurologic examination is notable for mild right upper extremity numbness to touch.  Strokes appear embolic in nature given the cortical and scattered appearance.  Etiology is unclear at this point pending workup.  Recommendations  Recommend that primary team order following: - Frequent Neuro checks per stroke unit protocol - Recommend obtaining TTE - Recommend obtaining Lipid panel with LDL - Please start statin if LDL > 70 - Recommend HbA1c to evaluate for diabetes and how well it is controlled. - Antithrombotic -aspirin 81 mg daily along with Plavix 75 mg daily for 21 days, followed by aspirin 81 mg daily alone. - Recommend DVT ppx - SBP goal - permissive hypertension first 24 h < 220/110. Held home  meds.  - Recommend Telemetry monitoring for arrythmia - Recommend bedside swallow screen prior to PO intake. - Stroke education booklet - Recommend PT/OT/SLP consult  ______________________________________________________________________   Thank you for the opportunity to take part in the care of this patient. If you have any further questions, please contact the neurology consultation attending.  Signed,  Erick Blinks [facility] Pager Number [phone] _ _ _   _ __   _ __ _ _  __ __   _ __   __ _

## 2023-01-30 NOTE — Evaluation (Signed)
Occupational Therapy Evaluation Patient Details Name: Morgan Sparks MRN: [id number] DOB: 09-17-1951 Today's Date: 01/30/2023   History of Present Illness The pt is a 72 yo female presenting 5/11 with RUE numbness and x1 week of SOB. Imaging showed small infarcts in the left frontoparietal region. PMH includes: HTN, and HLD.   Clinical Impression   Pt received in hall with PT. PTA, pt was very independent and active participant in classes at the senior center. Upon eval, pt presents with minor limitations in coordination and endurance. No significant deficits in strength, balance, cognition, or vision. Pt using BEU functionally during session, able to demo tub/shower transfer, and able to retrieve items from floor. Not OT follow up recommended at this time. OT to sign off; please re-consult if change in status.     Recommendations for follow up therapy are one component of a multi-disciplinary discharge planning process, led by the attending physician.  Recommendations may be updated based on patient status, additional functional criteria and insurance authorization.   Assistance Recommended at Discharge PRN  Patient can return home with the following Other (comment) (as needed)    Functional Status Assessment  Patient has had a recent decline in their functional status and demonstrates the ability to make significant improvements in function in a reasonable and predictable amount of time.  Equipment Recommendations  Other (comment) (Grab bars tub)    Recommendations for Other Services       Precautions / Restrictions Precautions Precautions: Fall Restrictions Weight Bearing Restrictions: No      Mobility Bed Mobility Overal bed mobility: Modified Independent Bed Mobility: Supine to Sit     Supine to sit: Modified independent (Device/Increase time)     General bed mobility comments: pt used bed rail, no assist    Transfers Overall transfer level: Needs  assistance Equipment used: None Transfers: Sit to/from Stand Sit to Stand: Supervision           General transfer comment: no instability in standing      Balance Overall balance assessment: Mild deficits observed, not formally tested                                         ADL either performed or assessed with clinical judgement   ADL Overall ADL's : Modified independent                                             Vision Baseline Vision/History: 0 No visual deficits (readers) Ability to See in Adequate Light: 0 Adequate Patient Visual Report: No change from baseline Vision Assessment?: No apparent visual deficits     Perception Perception Perception Tested?: No   Praxis Praxis Praxis tested?: Not tested    Pertinent Vitals/Pain Pain Assessment Pain Assessment: No/denies pain     Hand Dominance Left   Extremity/Trunk Assessment Upper Extremity Assessment Upper Extremity Assessment: Overall WFL for tasks assessed;RUE deficits/detail RUE Deficits / Details: minimally decreased coordination in RUE, but pt using functioally and min numbness   Lower Extremity Assessment Lower Extremity Assessment: Defer to PT evaluation   Cervical / Trunk Assessment Cervical / Trunk Assessment: Normal   Communication Communication Communication: No difficulties   Cognition Arousal/Alertness: Awake/alert Behavior During Therapy: WFL for tasks assessed/performed Overall Cognitive Status: Within Functional Limits for  tasks assessed                                       General Comments  SpO2 to low of 89% on RA after stairs, pt reprots slightly SOB at baseline with steps    Exercises     Shoulder Instructions      Home Living Family/patient expects to be discharged to:: Private residence Living Arrangements: Alone Available Help at Discharge: Family;Available 24 hours/day Type of Home: House Home Access: Stairs to  enter [employer] of Steps: 3 Entrance Stairs-Rails: Right;Left Home Layout: One level     Bathroom Shower/Tub: Chief Strategy Officer: Standard     Home Equipment: None          Prior Functioning/Environment Prior Level of Function : Independent/Modified Independent;Driving             Mobility Comments: no use of DME, 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 ADLs Comments: indpenedent        OT Problem List:        OT Treatment/Interventions:      OT Goals(Current goals can be found in the care plan section) Acute Rehab OT Goals Patient Stated Goal: go home OT Goal Formulation: With patient  OT Frequency:      Co-evaluation              AM-PAC OT "6 Clicks" Daily Activity     Outcome Measure Help from another person eating meals?: None Help from another person taking care of personal grooming?: None Help from another person toileting, which includes using toliet, bedpan, or urinal?: None Help from another person bathing (including washing, rinsing, drying)?: None Help from another person to put on and taking off regular upper body clothing?: None Help from another person to put on and taking off regular lower body clothing?: None 6 Click Score: 24   End of Session Equipment Utilized During Treatment: Gait belt Nurse Communication: Mobility status  Activity Tolerance: Patient tolerated treatment well Patient left: in bed;with call bell/phone within reach;with family/visitor present                   Time: 1715-1730 OT Time Calculation (min): 15 min Charges:  OT General Charges $OT Visit: 1 Visit OT Evaluation $OT Eval Low Complexity: 1 Low  Tyler Deis, OTR/L [facility] Acute Rehabilitation Office: [phone]   Myrla Halsted 01/30/2023, 6:13 PM

## 2023-01-30 NOTE — ED Provider Notes (Signed)
Received patient as transfer for MRI.  Patient with right upper extremity numbness.  MRI has been performed and there are small infarcts in the left frontoparietal region which explain sensory symptoms.  Neurology consulted.  Will admit to medicine service.   Gilda Crease, MD 01/30/23 0221

## 2023-01-30 NOTE — ED Notes (Signed)
Patient in mri

## 2023-01-30 NOTE — H&P (Addendum)
History and Physical  Morgan Sparks ZOX:[id number] DOB: [birth year] DOA: 01/29/2023  PCP: Verdell Face, DO Patient coming from: Home  I have personally briefly reviewed patient's old medical records in [facility]   Chief Complaint: Right upper extremity numbness  HPI: Morgan Sparks is a 72 y.o. female past medical history of essential hypertension hyperlipidemia  she relates she got up yesterday morning to go to church and her right upper extremity went numb.  She denies any falls fevers cough, no new medications or changes in her medication.  In the ED: She is out of the window for tPA, CT angio of the head and neck showed no large vessel occlusion mild bilateral carotid atherosclerosis.  MRI of the brain showed an acute small infarct to the left superior frontoparietal junction no mass effect or hemorrhage    Review of Systems: All systems reviewed and apart from history of presenting illness, are negative.  Past Medical History:  Diagnosis Date   Diverticulitis    High cholesterol    Past Surgical History:  Procedure Laterality Date   ABDOMINAL HYSTERECTOMY     Social History:  reports that she has quit smoking. She has never used smokeless tobacco. She reports current alcohol use. No history on file for drug use.   Allergies  Allergen Reactions   Dilaudid [Hydromorphone] Itching   Doxycycline Hives   Strawberry (Diagnostic) Hives    History reviewed. No pertinent family history.  Father died of heart attack  Prior to Admission medications   Medication Sig Start Date End Date Taking? Authorizing Provider  albuterol (VENTOLIN HFA) 108 (90 Base) MCG/ACT inhaler Inhale 2 puffs into the lungs daily as needed for wheezing or shortness of breath.  12/18/19  Yes [provider]  aspirin 81 MG tablet Take 81 mg by mouth 2 (two) times a week. Takes twice a week   Yes [provider]  BREZTRI AEROSPHERE 160-9-4.8 MCG/ACT AERO Take 2 puffs by mouth in the  morning and at bedtime. 10/29/22  Yes [provider]  dicyclomine (BENTYL) 10 MG capsule Take 1 capsule (10 mg total) by mouth 2 (two) times daily as needed for spasms. 12/21/22  Yes Arnetha Courser, MD  ezetimibe-simvastatin (VYTORIN) 10-20 MG tablet Take 1 tablet by mouth every evening.   Yes [provider]  fluticasone (FLONASE) 50 MCG/ACT nasal spray Place 1-2 sprays into both nostrils daily as needed for allergies. 03/07/22  Yes [provider]  hydrochlorothiazide (HYDRODIURIL) 25 MG tablet Take 25 mg by mouth every morning. 12/04/22  Yes [provider]  olmesartan (BENICAR) 20 MG tablet Take 20 mg by mouth daily.   Yes [provider]  omeprazole (PRILOSEC) 20 MG capsule Take 1 capsule (20 mg total) by mouth daily. 12/21/22  Adele Schilder, MD   Physical Exam: Vitals:   01/29/23 2300 01/30/23 0001 01/30/23 0504 01/30/23 0902  BP: (!) 177/90 (!) 184/95 (!) 150/81 (!) 155/85  Pulse: 74 82 78 77  Resp: (!) 22 (!) 21 18 19   Temp:  98.1 F (36.7 C) 97.9 F (36.6 C) 98.6 F (37 C)  TempSrc:  Oral Oral Oral  SpO2: 98% 97% 98% 97%  Weight:  72.5 kg    Height:  5\' 5"  (1.651 m)      General exam: Moderately built and nourished patient, lying comfortably in bed Head, eyes and ENT: Nontraumatic and normocephalic.  Neck: Supple. No JVD, carotid bruit or thyromegaly. Lymphatics: No lymphadenopathy. Respiratory system: Clear to  auscultation. No increased work of breathing. Cardiovascular system: S1 and S2 heard, RRR. No JVD. Gastrointestinal system: Abdomen is nondistended, soft and nontender. Normal bowel sounds heard. No organomegaly or masses appreciated. Central nervous system: She is awake alert and oriented x 3 fluent language and comprehensive 3-12 are grossly intact muscle strength 5 5 in all 4 extremities and mild decrease sensation in the right upper extremity to light touch from her elbow down to her fingers. Extremities: Symmetric 5 x 5  power. Skin: No rashes or acute findings. Musculoskeletal system: Negative exam. Psychiatry: Pleasant and cooperative.   Labs on Admission:  Basic Metabolic Panel: Recent Labs  Lab 01/29/23 1637  NA 138  K 4.1  CL 105  CO2 23  GLUCOSE 89  BUN 17  CREATININE 1.20*  CALCIUM 8.9   Liver Function Tests: Recent Labs  Lab 01/29/23 1637  AST 26  ALT 22  ALKPHOS 74  BILITOT 0.4  PROT 7.0  ALBUMIN 4.1   No results for input(s): "LIPASE", "AMYLASE" in the last 168 hours. No results for input(s): "AMMONIA" in the last 168 hours. CBC: Recent Labs  Lab 01/29/23 1637  WBC 4.8  NEUTROABS 2.8  HGB 12.8  HCT 38.0  MCV 100.5*  PLT 172   Cardiac Enzymes: No results for input(s): "CKTOTAL", "CKMB", "CKMBINDEX", "TROPONINI" in the last 168 hours.  BNP (last 3 results) No results for input(s): "PROBNP" in the last 8760 hours. CBG: Recent Labs  Lab 01/29/23 1632  GLUCAP 92    Radiological Exams on Admission: MR BRAIN WO CONTRAST  Result Date: 01/30/2023 CLINICAL DATA:  Acute neurologic deficit EXAM: MRI HEAD WITHOUT CONTRAST TECHNIQUE: Multiplanar, multiecho pulse sequences of the brain and surrounding structures were obtained without intravenous contrast. COMPARISON:  None Available. FINDINGS: Brain: Small acute infarcts at the left superior frontoparietal junction. No acute or chronic hemorrhage. There is multifocal hyperintense T2-weighted signal within the white matter. Parenchymal volume and CSF spaces are normal. The midline structures are normal. Vascular: Major flow voids are preserved. Skull and upper cervical spine: Normal calvarium and skull base. Visualized upper cervical spine and soft tissues are normal. Sinuses/Orbits:No paranasal sinus fluid levels or advanced mucosal thickening. No mastoid or middle ear effusion. Normal orbits. IMPRESSION: Small acute infarcts at the left superior frontoparietal junction. No hemorrhage or mass effect. Electronically Signed   By:  Deatra Robinson M.D.   On: 01/30/2023 01:34   CT ANGIO HEAD NECK W WO CM  Result Date: 01/29/2023 CLINICAL DATA:  Acute neurologic deficit EXAM: CT ANGIOGRAPHY HEAD AND NECK WITH AND WITHOUT CONTRAST TECHNIQUE: Multidetector CT imaging of the head and neck was performed using the standard protocol during bolus administration of intravenous contrast. Multiplanar CT image reconstructions and MIPs were obtained to evaluate the vascular anatomy. Carotid stenosis measurements (when applicable) are obtained utilizing NASCET criteria, using the distal internal carotid diameter as the denominator. RADIATION DOSE REDUCTION: This exam was performed according to the departmental dose-optimization program which includes automated exposure control, adjustment of the mA and/or kV according to patient size and/or use of iterative reconstruction technique. CONTRAST:  75mL OMNIPAQUE IOHEXOL 350 MG/ML SOLN COMPARISON:  None Available. FINDINGS: CT HEAD FINDINGS Brain: There is no mass, hemorrhage or extra-axial collection. The size and configuration of the ventricles and extra-axial CSF spaces are normal. There is no acute or chronic infarction. The brain parenchyma is normal. Skull: The visualized skull base, calvarium and extracranial soft tissues are normal. Sinuses/Orbits: No fluid levels or advanced mucosal thickening of  the visualized paranasal sinuses. No mastoid or middle ear effusion. The orbits are normal. CTA NECK FINDINGS SKELETON: There is no bony spinal canal stenosis. No lytic or blastic lesion. OTHER NECK: Normal pharynx, larynx and major salivary glands. No cervical lymphadenopathy. Unremarkable thyroid gland. UPPER CHEST: No pneumothorax or pleural effusion. No nodules or masses. AORTIC ARCH: There is calcific atherosclerosis of the aortic arch. There is no aneurysm, dissection or hemodynamically significant stenosis of the visualized portion of the aorta. Conventional 3 vessel aortic branching pattern. The  visualized proximal subclavian arteries are widely patent. RIGHT CAROTID SYSTEM: No dissection, occlusion or aneurysm. Mild atherosclerotic calcification at the carotid bifurcation without hemodynamically significant stenosis. LEFT CAROTID SYSTEM: No dissection, occlusion or aneurysm. Mild atherosclerotic calcification at the carotid bifurcation without hemodynamically significant stenosis. VERTEBRAL ARTERIES: Codominant configuration. Both origins are clearly patent. There is no dissection, occlusion or flow-limiting stenosis to the skull base (V1-V3 segments). CTA HEAD FINDINGS POSTERIOR CIRCULATION: --Vertebral arteries: Normal V4 segments. --Inferior cerebellar arteries: Normal. --Basilar artery: Normal. --Superior cerebellar arteries: Normal. --Posterior cerebral arteries (PCA): Normal. ANTERIOR CIRCULATION: --Intracranial internal carotid arteries: Atherosclerotic calcification of the internal carotid arteries at the skull base without hemodynamically significant stenosis. --Anterior cerebral arteries (ACA): Normal. Both A1 segments are present. Patent anterior communicating artery (a-comm). --Middle cerebral arteries (MCA): Normal. VENOUS SINUSES: As permitted by contrast timing, patent. ANATOMIC VARIANTS: Fetal origin of the right posterior cerebral artery. Review of the MIP images confirms the above findings. IMPRESSION: 1. No emergent large vessel occlusion or hemodynamically significant stenosis of the head or neck. 2. Mild bilateral carotid bifurcation atherosclerosis without hemodynamically significant stenosis. Aortic atherosclerosis (ICD10-I70.0). Electronically Signed   By: Deatra Robinson M.D.   On: 01/29/2023 19:36   DG Chest Portable 1 View  Result Date: 01/29/2023 CLINICAL DATA:  Shortness of breath. EXAM: PORTABLE CHEST 1 VIEW COMPARISON:  Chest x-ray August 02, 2022 FINDINGS: The heart size and mediastinal contours are within normal limits. Both lungs are clear. The visualized skeletal  structures are unremarkable. IMPRESSION: No active disease. Electronically Signed   By: Gerome Sam III M.D.   On: 01/29/2023 17:04    EKG: Independently reviewed.  Sinus rhythm normal axis no T wave abnormalities  Assessment/Plan Acute CVA (cerebrovascular accident) (HCC) HgbA1c pending, fasting lipid panel LDL greater than 100, HDL 295 MRI with CVA, neurology has been consulted. PT, OT, Speech consult  Transthoracic Echo. Start patient on ASA 81mg  daily and plavix 75mg  daily continue for 3 weeks and then aspirin alone. Crestor 20 mg BP goal: permissive HTN upto 220/120 mmHg, hold home meds. Use hydralazine orally as needed for systolic blood pressure greater than 200. She passed her swallowing evaluation will allow a diet. Telemetry monitoring twelve-lead EKG shows sinus rhythm. Neurology has been consulted appreciate assistance  Essential hypertension Hydralazine orally as needed for systolic blood pressure greater than 200. Will allow permissive hypertension.  Hyperlipidemia Discontinue Warrick Parisian, started on Crestor. Check fasting lipid panel.   DVT Prophylaxis: lovenox Code Status: full  Family Communication: none  Disposition Plan: inpatient      It is my clinical opinion that admission to INPATIENT is reasonable and necessary in this 72 y.o. female acute CVA  Given the aforementioned, the predictability of an adverse outcome is felt to be significant. I expect that the patient will require at least 2 midnights in the hospital to treat this condition.  Marinda Elk MD [facility]   01/30/2023, 10:14 AM

## 2023-01-30 NOTE — Evaluation (Signed)
Physical Therapy Evaluation Patient Details Name: Morgan Sparks MRN: [id number] DOB: [birth year] Today's Date: 01/30/2023  History of Present Illness  The pt is a 72 yo female presenting 5/11 with RUE numbness and x1 week of SOB. Imaging showed small infarcts in the left frontoparietal region. PMH includes: HTN, and HLD.   Clinical Impression  Pt in bed upon arrival of PT, agreeable to evaluation at this time. Prior to admission the pt was independent with all activity, participating in exercise classes at the senior center, and living alone independent with all ADLs and IADLs. The pt now presents with minor limitations in functional mobility and endurance, but reports some SOB with exertion is baseline for her. No focal deficits in LE strength or coordination noted, and the pt was able to complete hallway ambulation, stairs, and balance testing with gait well without need for UE support or assist. The pt will be safe to return home without follow up PT at this time, and has no further acute PT needs. Feel free to re-consult if change in status or mobility.     Recommendations for follow up therapy are one component of a multi-disciplinary discharge planning process, led by the attending physician.  Recommendations may be updated based on patient status, additional functional criteria and insurance authorization.     Assistance Recommended at Discharge PRN     Equipment Recommendations None recommended by PT  Recommendations for Other Services       Functional Status Assessment Patient has not had a recent decline in their functional status     Precautions / Restrictions Precautions Precautions: Fall Restrictions Weight Bearing Restrictions: No      Mobility  Bed Mobility Overal bed mobility: Modified Independent Bed Mobility: Supine to Sit     Supine to sit: Modified independent (Device/Increase time)     General bed mobility comments: pt used bed rail, no assist     Transfers Overall transfer level: Needs assistance Equipment used: None Transfers: Sit to/from Stand Sit to Stand: Supervision           General transfer comment: no instability in standing    Ambulation/Gait Ambulation/Gait assistance: Min guard Gait Distance (Feet): 150 Feet Assistive device: None Gait Pattern/deviations: Step-through pattern, Decreased stride length Gait velocity: decreased Gait velocity interpretation: <1.31 ft/sec, indicative of household ambulator   General Gait Details: slowed speed, no overt LOB even with challenge. SpO2 low of 89%  Stairs Stairs: Yes Stairs assistance: Min guard Stair Management: One rail Right, Alternating pattern, Forwards Number of Stairs: 4 General stair comments: use of rail up, not down. SOB with SpO2 89% on RA after stairs. pt states this is normal  Wheelchair Mobility    Modified Rankin (Stroke Patients Only) Modified Rankin (Stroke Patients Only) Pre-Morbid Rankin Score: No symptoms Modified Rankin: Moderate disability     Balance Overall balance assessment: Mild deficits observed, not formally tested                                           Pertinent Vitals/Pain Pain Assessment Pain Assessment: No/denies pain    Home Living Family/patient expects to be discharged to:: Private residence Living Arrangements: Alone Available Help at Discharge: Family;Available 24 hours/day Type of Home: House Home Access: Stairs to enter Entrance Stairs-Rails: Doctor, general practice of Steps: 3   Home Layout: One level Home Equipment: None      Prior  Function Prior Level of Function : Independent/Modified Independent;Driving             Mobility Comments: no use of DME, 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 ADLs Comments: indpenedent     Hand Dominance   Dominant Hand: Left    Extremity/Trunk Assessment   Upper Extremity Assessment Upper Extremity  Assessment: Defer to OT evaluation    Lower Extremity Assessment Lower Extremity Assessment: Overall WFL for tasks assessed    Cervical / Trunk Assessment Cervical / Trunk Assessment: Normal  Communication   Communication: No difficulties  Cognition Arousal/Alertness: Awake/alert Behavior During Therapy: WFL for tasks assessed/performed Overall Cognitive Status: Within Functional Limits for tasks assessed                                          General Comments General comments (skin integrity, edema, etc.): SpO2 to low of 89% on RA after stairs, pt reprots slightly SOB at baseline with steps    Exercises     Assessment/Plan    PT Assessment Patient does not need any further PT services  PT Problem List Decreased activity tolerance;Decreased mobility;Decreased balance       PT Treatment Interventions Gait training;Stair training;Functional mobility training;Therapeutic activities;Therapeutic exercise;Balance training    PT Goals (Current goals can be found in the Care Plan section)  Acute Rehab PT Goals Patient Stated Goal: return home PT Goal Formulation: All assessment and education complete, DC therapy Time For Goal Achievement: 02/06/23 Potential to Achieve Goals: Good     AM-PAC PT "6 Clicks" Mobility  Outcome Measure Help needed turning from your back to your side while in a flat bed without using bedrails?: None Help needed moving from lying on your back to sitting on the side of a flat bed without using bedrails?: None Help needed moving to and from a bed to a chair (including a wheelchair)?: A Little Help needed standing up from a chair using your arms (e.g., wheelchair or bedside chair)?: A Little Help needed to walk in hospital room?: A Little Help needed climbing 3-5 steps with a railing? : A Little 6 Click Score: 20    End of Session   Activity Tolerance: Patient tolerated treatment well Patient left:  (with OT) Nurse Communication:  Mobility status PT Visit Diagnosis: Unsteadiness on feet (R26.81);Other abnormalities of gait and mobility (R26.89)    Time: 1610-9604 PT Time Calculation (min) (ACUTE ONLY): 19 min   Charges:   PT Evaluation $PT Eval Low Complexity: 1 Low          Vickki Muff, PT, DPT   Acute Rehabilitation Department Office [phone] Secure Chat Communication Preferred  Morgan Sparks 01/30/2023, 6:00 PM

## 2023-01-30 NOTE — Progress Notes (Signed)
Bilateral lower extremity venous duplex has been completed. Preliminary results can be found in CV Proc through chart review.   01/30/23 11:25 AM Olen Cordial RVT

## 2023-01-30 NOTE — ED Notes (Signed)
ED TO INPATIENT HANDOFF REPORT  ED Nurse Name and Phone #: Victorino Dike [phone]  S Name/Age/Gender Morgan Sparks 72 y.o. female Room/Bed: H021C/H021C  Code Status   Code Status: Full Code  Home/SNF/Other Home Patient oriented to: self, place, time, and situation Is this baseline? Yes   Triage Complete: Triage complete  Chief Complaint Acute CVA (cerebrovascular accident) (HCC) [I63.9]  Triage Note Pt with numbness to RUE since 0700; sts BP is elevated and she is compliant with medication  Patient arrives via [facility] for mri    Allergies Allergies  Allergen Reactions   Dilaudid [Hydromorphone] Itching   Doxycycline Hives   Strawberry (Diagnostic) Hives    Level of Care/Admitting Diagnosis ED Disposition     ED Disposition  Admit   Condition  --   Comment  Hospital Area: [facility] [[id number]]  Level of Care: Telemetry Medical [104]  May place patient in observation at [facility] or [facility] if equivalent level of care is available:: No  Covid Evaluation: Asymptomatic - no recent exposure (last 10 days) testing not required  Diagnosis: Acute CVA (cerebrovascular accident) [facility]) [[id number]]  Admitting Physician: Hannah Beat [[id number]]  Attending Physician: Hannah Beat [[id number]]          B Medical/Surgery History Past Medical History:  Diagnosis Date   Diverticulitis    High cholesterol    Past Surgical History:  Procedure Laterality Date   ABDOMINAL HYSTERECTOMY       A IV Location/Drains/Wounds Patient Lines/Drains/Airways Status     Active Line/Drains/Airways     Name Placement date Placement time Site Days   Peripheral IV 01/29/23 20 G Right Antecubital 01/29/23  1641  Antecubital  1            Intake/Output Last 24 hours No intake or output data in the 24 hours ending 01/30/23 1358  Labs/Imaging Results for orders placed or performed during the hospital encounter of 01/29/23 (from the past 48 hour(s))  POC CBG,  ED     Status: None   Collection Time: 01/29/23  4:32 PM  Result Value Ref Range   Glucose-Capillary 92 70 - 99 mg/dL    Comment: Glucose reference range applies only to samples taken after fasting for at least 8 hours.  CBC with Differential     Status: Abnormal   Collection Time: 01/29/23  4:37 PM  Result Value Ref Range   WBC 4.8 4.0 - 10.5 K/uL   RBC 3.78 (L) 3.87 - 5.11 MIL/uL   Hemoglobin 12.8 12.0 - 15.0 g/dL   HCT 86.5 78.4 - 69.6 %   MCV 100.5 (H) 80.0 - 100.0 fL   MCH 33.9 26.0 - 34.0 pg   MCHC 33.7 30.0 - 36.0 g/dL   RDW 29.5 28.4 - 13.2 %   Platelets 172 150 - 400 K/uL   nRBC 0.0 0.0 - 0.2 %   Neutrophils Relative % 57 %   Neutro Abs 2.8 1.7 - 7.7 K/uL   Lymphocytes Relative 31 %   Lymphs Abs 1.5 0.7 - 4.0 K/uL   Monocytes Relative 9 %   Monocytes Absolute 0.5 0.1 - 1.0 K/uL   Eosinophils Relative 2 %   Eosinophils Absolute 0.1 0.0 - 0.5 K/uL   Basophils Relative 1 %   Basophils Absolute 0.0 0.0 - 0.1 K/uL   Immature Granulocytes 0 %   Abs Immature Granulocytes 0.01 0.00 - 0.07 K/uL    Comment: Performed at [facility], [address] [place], [place] [address]  Troponin I (High Sensitivity)     Status: None   Collection Time: 01/29/23  4:37 PM  Result Value Ref Range   Troponin I (High Sensitivity) <2 <18 ng/L    Comment: (NOTE) Elevated high sensitivity troponin I (hsTnI) values and significant  changes across serial measurements may suggest ACS but many other  chronic and acute conditions are known to elevate hsTnI results.  Refer to the "Links" section for chest pain algorithms and additional  guidance. Performed at [facility], [address] [place], [place] [address]   Comprehensive metabolic panel     Status: Abnormal   Collection Time: 01/29/23  4:37 PM  Result Value Ref Range   Sodium 138 135 - 145 mmol/L   Potassium 4.1 3.5 - 5.1 mmol/L   Chloride 105 98 - 111 mmol/L   CO2 23 22 - 32 mmol/L   Glucose, Bld 89 70 - 99  mg/dL    Comment: Glucose reference range applies only to samples taken after fasting for at least 8 hours.   BUN 17 8 - 23 mg/dL   Creatinine, Ser 0.98 (H) 0.44 - 1.00 mg/dL   Calcium 8.9 8.9 - 11.9 mg/dL   Total Protein 7.0 6.5 - 8.1 g/dL   Albumin 4.1 3.5 - 5.0 g/dL   AST 26 15 - 41 U/L   ALT 22 0 - 44 U/L   Alkaline Phosphatase 74 38 - 126 U/L   Total Bilirubin 0.4 0.3 - 1.2 mg/dL   GFR, Estimated 49 (L) >60 mL/min    Comment: (NOTE) Calculated using the CKD-EPI Creatinine Equation (2021)    Anion gap 10 5 - 15    Comment: Performed at [facility], [address] [place], [place] [address]  TSH     Status: None   Collection Time: 01/29/23  4:37 PM  Result Value Ref Range   TSH 0.679 0.350 - 4.500 uIU/mL    Comment: Performed by a 3rd Generation assay with a functional sensitivity of <=0.01 uIU/mL. Performed at [facility], [address], [place], [place] [address]   Brain natriuretic peptide     Status: Abnormal   Collection Time: 01/29/23  4:37 PM  Result Value Ref Range   B Natriuretic Peptide 376.8 (H) 0.0 - 100.0 pg/mL    Comment: Performed at [facility], [address] [place], [place] [address]  Troponin I (High Sensitivity)     Status: None   Collection Time: 01/29/23  7:20 PM  Result Value Ref Range   Troponin I (High Sensitivity) 2 <18 ng/L    Comment: (NOTE) Elevated high sensitivity troponin I (hsTnI) values and significant  changes across serial measurements may suggest ACS but many other  chronic and acute conditions are known to elevate hsTnI results.  Refer to the "Links" section for chest pain algorithms and additional  guidance. Performed at [facility], [address] [place], [place] [address]   Lipid panel     Status: Abnormal   Collection Time: 01/30/23  5:36 AM  Result Value Ref Range   Cholesterol 240 (H) 0 - 200 mg/dL   Triglycerides 57 <629 mg/dL   HDL 528 >41 mg/dL   Total CHOL/HDL Ratio 2.0  RATIO   VLDL 11 0 - 40 mg/dL   LDL Cholesterol 324 (H) 0 - 99 mg/dL    Comment:  Total Cholesterol/HDL:CHD Risk Coronary Heart Disease Risk Table                     Men   Women  1/2 Average Risk   3.4   3.3  Average Risk       5.0   4.4  2 X Average Risk   9.6   7.1  3 X Average Risk  23.4   11.0        Use the calculated Patient Ratio above and the CHD Risk Table to determine the patient's CHD Risk.        ATP III CLASSIFICATION (LDL):  <100     mg/dL   Optimal  161-096  mg/dL   Near or Above                    Optimal  130-159  mg/dL   Borderline  045-409  mg/dL   High  >811     mg/dL   Very High Performed at [facility], [address] [place], [place] [address]   Hemoglobin A1c     Status: Abnormal   Collection Time: 01/30/23  5:38 AM  Result Value Ref Range   Hgb A1c MFr Bld 6.2 (H) 4.8 - 5.6 %    Comment: (NOTE) Pre diabetes:          5.7%-6.4%  Diabetes:              >6.4%  Glycemic control for   <7.0% adults with diabetes    Mean Plasma Glucose 131.24 mg/dL    Comment: Performed at [facility], [address], [place], [place] [address]   ECHOCARDIOGRAM COMPLETE BUBBLE STUDY  Result Date: 01/30/2023    ECHOCARDIOGRAM REPORT   Patient Name:   Morgan Sparks Date of Exam: 01/30/2023 Medical Rec #:  [id number]     Height:       65.0 in Accession #:    [id number]    Weight:       159.8 lb Date of Birth:  08-Aug-1951     BSA:          1.798 m Patient Age:    71 years      BP:           155/85 mmHg Patient Gender: F             HR:           79 bpm. Exam Location:  Inpatient Procedure: 2D Echo, Color Doppler, Cardiac Doppler and Saline Contrast Bubble            Study Indications:     Stroke  History:         Patient has no prior history of Echocardiogram examinations.                  Stroke; Risk Factors:Hypertension and Dyslipidemia.  Sonographer:     Milbert Coulter Referring Phys:  [id number] JAN A MANSY Diagnosing Phys: [place] IMPRESSIONS  1. Left  ventricular ejection fraction, by estimation, is 60 to 65%. The left ventricle has normal function. The left ventricle has no regional wall motion abnormalities. Left ventricular diastolic parameters are consistent with Grade I diastolic dysfunction (impaired relaxation).  2. Right ventricular systolic function is normal. The right ventricular size is normal. Tricuspid regurgitation signal is inadequate for assessing PA pressure.  3. The mitral valve was not well visualized. No evidence of mitral valve regurgitation.  4. The aortic valve was not well visualized. Aortic valve regurgitation is not visualized.  5. Aortic not well visualized.  6. The inferior vena cava is normal in size with greater than 50% respiratory variability, suggesting right atrial pressure of 3 mmHg. FINDINGS  Left Ventricle: Left ventricular ejection fraction, by estimation, is 60 to 65%. The left ventricle has normal function. The left ventricle has no regional wall motion abnormalities. The left ventricular internal cavity size was normal in size. There is  no left ventricular hypertrophy. Left ventricular diastolic parameters are consistent with Grade I diastolic dysfunction (impaired relaxation). Right Ventricle: The right ventricular size is normal. Right ventricular systolic function is normal. Tricuspid regurgitation signal is inadequate for assessing PA pressure. Left Atrium: Left atrial size was normal in size. Right Atrium: Right atrial size was normal in size. Pericardium: There is no evidence of pericardial effusion. Mitral Valve: The mitral valve was not well visualized. No evidence of mitral valve regurgitation. Tricuspid Valve: Tricuspid valve regurgitation is not demonstrated. Aortic Valve: The aortic valve was not well visualized. Aortic valve regurgitation is not visualized. Aortic valve mean gradient measures 3.0 mmHg. Aortic valve peak gradient measures 6.2 mmHg. Aortic valve area, by VTI measures 1.83 cm. Pulmonic Valve:  The pulmonic valve was not well visualized. Aorta: Not well visualized. Venous: The inferior vena cava is normal in size with greater than 50% respiratory variability, suggesting right atrial pressure of 3 mmHg. IAS/Shunts: The interatrial septum was not well visualized. Agitated saline contrast was given intravenously to evaluate for intracardiac shunting.  LEFT VENTRICLE PLAX 2D LVIDd:         4.00 cm     Diastology LVIDs:         2.40 cm     LV e' medial:    5.22 cm/s LV PW:         0.90 cm     LV E/e' medial:  10.1 LV IVS:        1.00 cm     LV e' lateral:   9.46 cm/s LVOT diam:     1.80 cm     LV E/e' lateral: 5.6 LV SV:         44 LV SV Index:   24 LVOT Area:     2.54 cm  LV Volumes (MOD) LV vol d, MOD A2C: 21.4 ml LV vol d, MOD A4C: 35.1 ml LV vol s, MOD A2C: 8.3 ml LV vol s, MOD A4C: 13.2 ml LV SV MOD A2C:     13.1 ml LV SV MOD A4C:     35.1 ml LV SV MOD BP:      17.4 ml RIGHT VENTRICLE RV Basal diam:  2.90 cm RV Mid diam:    2.50 cm RV S prime:     11.40 cm/s TAPSE (M-mode): 2.3 cm LEFT ATRIUM             Index       RIGHT ATRIUM          Index LA Vol (A2C):   9.4 ml  5.20 ml/m  RA Area:     9.73 cm LA Vol (A4C):   17.3 ml 9.62 ml/m  RA Volume:   20.40 ml 11.34 ml/m LA Biplane Vol: 13.3 ml 7.40 ml/m  AORTIC VALVE AV Area (Vmax):    1.92 cm AV Area (Vmean):   1.86 cm AV Area (VTI):     1.83 cm AV Vmax:  124.00 cm/s AV Vmean:          83.400 cm/s AV VTI:            0.238 m AV Peak Grad:      6.2 mmHg AV Mean Grad:      3.0 mmHg LVOT Vmax:         93.80 cm/s LVOT Vmean:        60.800 cm/s LVOT VTI:          0.171 m LVOT/AV VTI ratio: 0.72 MITRAL VALVE MV Area (PHT): 4.17 cm     SHUNTS MV Decel Time: 182 msec     Systemic VTI:  0.17 m MV E velocity: 52.90 cm/s   Systemic Diam: 1.80 cm MV A velocity: 104.00 cm/s MV E/A ratio:  0.51 Photographer signed by Carolan Clines Signature Date/Time: 01/30/2023/1:52:41 PM    Final (Updated)    VAS [place] LOWER EXTREMITY VENOUS (DVT)  Result  Date: 01/30/2023  Lower Venous DVT Study Patient Name:  Morgan Sparks  Date of Exam:   01/30/2023 Medical Rec #: [id number]      Accession #:    [id number] Date of Birth: Feb 14, 1951      Patient Gender: F Patient Age:   89 years Exam Location:  [facility] Procedure:      VAS [place] LOWER EXTREMITY VENOUS (DVT) Referring Phys: Scheryl Marten XU --------------------------------------------------------------------------------  Indications: Stroke.  Risk Factors: None identified. Limitations: Poor ultrasound/tissue interface. Comparison Study: No prior studies. Performing Technologist: Chanda Busing RVT  Examination Guidelines: A complete evaluation includes B-mode imaging, spectral Doppler, color Doppler, and power Doppler as needed of all accessible portions of each vessel. Bilateral testing is considered an integral part of a complete examination. Limited examinations for reoccurring indications may be performed as noted. The reflux portion of the exam is performed with the patient in reverse Trendelenburg.  +---------+---------------+---------+-----------+----------+-------------------+ RIGHT    CompressibilityPhasicitySpontaneityPropertiesThrombus Aging      +---------+---------------+---------+-----------+----------+-------------------+ CFV      Full           Yes      Yes                                      +---------+---------------+---------+-----------+----------+-------------------+ SFJ      Full                                                             +---------+---------------+---------+-----------+----------+-------------------+ FV Prox  Full                                                             +---------+---------------+---------+-----------+----------+-------------------+ FV Mid   Full                                                             +---------+---------------+---------+-----------+----------+-------------------+ FV DistalFull                                                              +---------+---------------+---------+-----------+----------+-------------------+  PFV      Full                                                             +---------+---------------+---------+-----------+----------+-------------------+ POP      Full           Yes      Yes                                      +---------+---------------+---------+-----------+----------+-------------------+ PTV      Full                                                             +---------+---------------+---------+-----------+----------+-------------------+ PERO                                                  Not well visualized +---------+---------------+---------+-----------+----------+-------------------+   +---------+---------------+---------+-----------+----------+--------------+ LEFT     CompressibilityPhasicitySpontaneityPropertiesThrombus Aging +---------+---------------+---------+-----------+----------+--------------+ CFV      Full           Yes      Yes                                 +---------+---------------+---------+-----------+----------+--------------+ SFJ      Full                                                        +---------+---------------+---------+-----------+----------+--------------+ FV Prox  Full                                                        +---------+---------------+---------+-----------+----------+--------------+ FV Mid                  Yes      Yes                                 +---------+---------------+---------+-----------+----------+--------------+ FV Distal               Yes      Yes                                 +---------+---------------+---------+-----------+----------+--------------+ PFV      Full                                                        +---------+---------------+---------+-----------+----------+--------------+  POP      Full           Yes       Yes                                 +---------+---------------+---------+-----------+----------+--------------+ PTV      Full                                                        +---------+---------------+---------+-----------+----------+--------------+ PERO     Full                                                        +---------+---------------+---------+-----------+----------+--------------+    Summary: RIGHT: - There is no evidence of deep vein thrombosis in the lower extremity. However, portions of this examination were limited- see technologist comments above.  - No cystic structure found in the popliteal fossa.  LEFT: - There is no evidence of deep vein thrombosis in the lower extremity. However, portions of this examination were limited- see technologist comments above.  - No cystic structure found in the popliteal fossa.  *See table(s) above for measurements and observations.    Preliminary    MR BRAIN WO CONTRAST  Result Date: 01/30/2023 CLINICAL DATA:  Acute neurologic deficit EXAM: MRI HEAD WITHOUT CONTRAST TECHNIQUE: Multiplanar, multiecho pulse sequences of the brain and surrounding structures were obtained without intravenous contrast. COMPARISON:  None Available. FINDINGS: Brain: Small acute infarcts at the left superior frontoparietal junction. No acute or chronic hemorrhage. There is multifocal hyperintense T2-weighted signal within the white matter. Parenchymal volume and CSF spaces are normal. The midline structures are normal. Vascular: Major flow voids are preserved. Skull and upper cervical spine: Normal calvarium and skull base. Visualized upper cervical spine and soft tissues are normal. Sinuses/Orbits:No paranasal sinus fluid levels or advanced mucosal thickening. No mastoid or middle ear effusion. Normal orbits. IMPRESSION: Small acute infarcts at the left superior frontoparietal junction. No hemorrhage or mass effect. Electronically Signed   By: Deatra Robinson  M.D.   On: 01/30/2023 01:34   CT ANGIO HEAD NECK W WO CM  Result Date: 01/29/2023 CLINICAL DATA:  Acute neurologic deficit EXAM: CT ANGIOGRAPHY HEAD AND NECK WITH AND WITHOUT CONTRAST TECHNIQUE: Multidetector CT imaging of the head and neck was performed using the standard protocol during bolus administration of intravenous contrast. Multiplanar CT image reconstructions and MIPs were obtained to evaluate the vascular anatomy. Carotid stenosis measurements (when applicable) are obtained utilizing NASCET criteria, using the distal internal carotid diameter as the denominator. RADIATION DOSE REDUCTION: This exam was performed according to the departmental dose-optimization program which includes automated exposure control, adjustment of the mA and/or kV according to patient size and/or use of iterative reconstruction technique. CONTRAST:  75mL OMNIPAQUE IOHEXOL 350 MG/ML SOLN COMPARISON:  None Available. FINDINGS: CT HEAD FINDINGS Brain: There is no mass, hemorrhage or extra-axial collection. The size and configuration of the ventricles and extra-axial CSF spaces are normal. There is no acute or chronic infarction. The brain parenchyma is normal. Skull: The visualized skull base, calvarium and extracranial soft tissues are normal.  Sinuses/Orbits: No fluid levels or advanced mucosal thickening of the visualized paranasal sinuses. No mastoid or middle ear effusion. The orbits are normal. CTA NECK FINDINGS SKELETON: There is no bony spinal canal stenosis. No lytic or blastic lesion. OTHER NECK: Normal pharynx, larynx and major salivary glands. No cervical lymphadenopathy. Unremarkable thyroid gland. UPPER CHEST: No pneumothorax or pleural effusion. No nodules or masses. AORTIC ARCH: There is calcific atherosclerosis of the aortic arch. There is no aneurysm, dissection or hemodynamically significant stenosis of the visualized portion of the aorta. Conventional 3 vessel aortic branching pattern. The visualized proximal  subclavian arteries are widely patent. RIGHT CAROTID SYSTEM: No dissection, occlusion or aneurysm. Mild atherosclerotic calcification at the carotid bifurcation without hemodynamically significant stenosis. LEFT CAROTID SYSTEM: No dissection, occlusion or aneurysm. Mild atherosclerotic calcification at the carotid bifurcation without hemodynamically significant stenosis. VERTEBRAL ARTERIES: Codominant configuration. Both origins are clearly patent. There is no dissection, occlusion or flow-limiting stenosis to the skull base (V1-V3 segments). CTA HEAD FINDINGS POSTERIOR CIRCULATION: --Vertebral arteries: Normal V4 segments. --Inferior cerebellar arteries: Normal. --Basilar artery: Normal. --Superior cerebellar arteries: Normal. --Posterior cerebral arteries (PCA): Normal. ANTERIOR CIRCULATION: --Intracranial internal carotid arteries: Atherosclerotic calcification of the internal carotid arteries at the skull base without hemodynamically significant stenosis. --Anterior cerebral arteries (ACA): Normal. Both A1 segments are present. Patent anterior communicating artery (a-comm). --Middle cerebral arteries (MCA): Normal. VENOUS SINUSES: As permitted by contrast timing, patent. ANATOMIC VARIANTS: Fetal origin of the right posterior cerebral artery. Review of the MIP images confirms the above findings. IMPRESSION: 1. No emergent large vessel occlusion or hemodynamically significant stenosis of the head or neck. 2. Mild bilateral carotid bifurcation atherosclerosis without hemodynamically significant stenosis. Aortic atherosclerosis (ICD10-I70.0). Electronically Signed   By: Deatra Robinson M.D.   On: 01/29/2023 19:36   DG Chest Portable 1 View  Result Date: 01/29/2023 CLINICAL DATA:  Shortness of breath. EXAM: PORTABLE CHEST 1 VIEW COMPARISON:  Chest x-ray August 02, 2022 FINDINGS: The heart size and mediastinal contours are within normal limits. Both lungs are clear. The visualized skeletal structures are  unremarkable. IMPRESSION: No active disease. Electronically Signed   By: Gerome Sam III M.D.   On: 01/29/2023 17:04    Pending Labs Unresulted Labs (From admission, onward)     Start     Ordered   01/31/23 0500  Lipid panel  (Labs)  Tomorrow morning,   R       Comments: Fasting    01/30/23 0526            Vitals/Pain Today's Vitals   01/29/23 2300 01/30/23 0001 01/30/23 0504 01/30/23 0902  BP: (!) 177/90 (!) 184/95 (!) 150/81 (!) 155/85  Pulse: 74 82 78 77  Resp: (!) 22 (!) 21 18 19   Temp:  98.1 F (36.7 C) 97.9 F (36.6 C) 98.6 F (37 C)  TempSrc:  Oral Oral Oral  SpO2: 98% 97% 98% 97%  Weight:  72.5 kg    Height:  5\' 5"  (1.651 m)    PainSc:  0-No pain      Isolation Precautions No active isolations  Medications Medications  aspirin EC tablet 81 mg (81 mg Oral Given 01/30/23 1104)  clopidogrel (PLAVIX) tablet 75 mg (75 mg Oral Given 01/30/23 1104)  hydrochlorothiazide (HYDRODIURIL) tablet 25 mg (25 mg Oral Given 01/30/23 1104)  irbesartan (AVAPRO) tablet 37.5 mg (37.5 mg Oral Given 01/30/23 1103)  dicyclomine (BENTYL) capsule 10 mg (has no administration in time range)  pantoprazole (PROTONIX) EC tablet 40 mg (40 mg  Oral Given 01/30/23 1104)  albuterol (PROVENTIL) (2.5 MG/3ML) 0.083% nebulizer solution 2.5 mg (has no administration in time range)  fluticasone (FLONASE) 50 MCG/ACT nasal spray 1-2 spray (has no administration in time range)   stroke: early stages of recovery book (has no administration in time range)  0.9 %  sodium chloride infusion ( Intravenous New Bag/Given 01/30/23 0603)  acetaminophen (TYLENOL) tablet 650 mg (has no administration in time range)    Or  acetaminophen (TYLENOL) 160 MG/5ML solution 650 mg (has no administration in time range)    Or  acetaminophen (TYLENOL) suppository 650 mg (has no administration in time range)  senna-docusate (Senokot-S) tablet 1 tablet (has no administration in time range)  enoxaparin (LOVENOX) injection 40  mg (40 mg Subcutaneous Given 01/30/23 1104)  traZODone (DESYREL) tablet 25 mg (has no administration in time range)  ondansetron (ZOFRAN) injection 4 mg (has no administration in time range)  rosuvastatin (CRESTOR) tablet 20 mg (20 mg Oral Given 01/30/23 1104)  iohexol (OMNIPAQUE) 350 MG/ML injection 75 mL (75 mLs Intravenous Contrast Given 01/29/23 1738)  LORazepam (ATIVAN) injection 1 mg (1 mg Intravenous Given 01/30/23 0042)    Mobility walks     Focused Assessments Neuro Assessment Handoff:  Swallow screen pass? Yes    NIH Stroke Scale  Dizziness Present: No Headache Present: No Interval: Other (Comment) Level of Consciousness (1a.)   : Alert, keenly responsive LOC Questions (1b. )   : Answers both questions correctly LOC Commands (1c. )   : Performs both tasks correctly Best Gaze (2. )  : Normal Visual (3. )  : No visual loss Facial Palsy (4. )    : Normal symmetrical movements Motor Arm, Left (5a. )   : No drift Motor Arm, Right (5b. ) : No drift Motor Leg, Left (6a. )  : No drift Motor Leg, Right (6b. ) : No drift Limb Ataxia (7. ): Absent Sensory (8. )  : Mild-to-moderate sensory loss, patient feels pinprick is less sharp or is dull on the affected side, or there is a loss of superficial pain with pinprick, but patient is aware of being touched Best Language (9. )  : No aphasia Dysarthria (10. ): Normal Extinction/Inattention (11.)   : No Abnormality Complete NIHSS TOTAL: 1     Neuro Assessment: Within Defined Limits Neuro Checks:   Initial (01/29/23 1701)  Has TPA been given? No If patient is a Neuro Trauma and patient is going to OR before floor call report to 4N Charge nurse: [phone] or [phone]   R Recommendations: See Admitting Provider Note  Report given to:   Additional Notes:

## 2023-01-31 ENCOUNTER — Other Ambulatory Visit (HOSPITAL_COMMUNITY): Payer: Self-pay

## 2023-01-31 DIAGNOSIS — I639 Cerebral infarction, unspecified: Secondary | ICD-10-CM | POA: Diagnosis not present

## 2023-01-31 LAB — LIPID PANEL
Cholesterol: 207 mg/dL — ABNORMAL HIGH (ref 0–200)
HDL: 105 mg/dL (ref >40)
LDL Cholesterol: 92 mg/dL (ref 0–99)
Total CHOL/HDL Ratio: 2 RATIO
Triglycerides: 50 mg/dL (ref <150)
VLDL: 10 mg/dL (ref 0–40)

## 2023-01-31 MED ORDER — ASPIRIN 81 MG PO TABS: 81.0000 mg | ORAL_TABLET | ORAL | 0 refills | Status: DC

## 2023-01-31 MED ORDER — OLMESARTAN MEDOXOMIL 20 MG PO TABS: 20.0000 mg | ORAL_TABLET | Freq: Every day | ORAL | Status: DC

## 2023-01-31 MED ORDER — ROSUVASTATIN CALCIUM 20 MG PO TABS
20.0000 mg | ORAL_TABLET | Freq: Every day | ORAL | 0 refills | Status: DC
  Filled 2023-01-31: qty 30, 30d supply, fill #0

## 2023-01-31 MED ORDER — CLOPIDOGREL BISULFATE 75 MG PO TABS
75.0000 mg | ORAL_TABLET | Freq: Every day | ORAL | 1 refills | Status: DC
  Filled 2023-01-31: qty 30, 30d supply, fill #0

## 2023-01-31 NOTE — TOC Transition Note (Signed)
Transition of Care [facility]) - CM/SW Discharge Note   Patient Details  Name: Morgan Sparks MRN: [id number] Date of Birth: 01/30/1951  [facility] CM/SW Contact:  Gordy Clement, RN Phone Number: 01/31/2023, 9:23 AM   Clinical Narrative:    Patient to DC to home today.  There are no recommendations for Home Health or DME.  Patient will follow up with Provider as specified on AVS. No TOC needs          Patient Goals and CMS Choice      Discharge Placement                         Discharge Plan and Services Additional resources added to the After Visit Summary for                                       Social Determinants of Health (SDOH) Interventions SDOH Screenings   Food Insecurity: No Food Insecurity (01/30/2023)  Housing: Low Risk  (01/30/2023)  Transportation Needs: No Transportation Needs (01/30/2023)  Utilities: Not At Risk (01/30/2023)  Tobacco Use: Medium Risk (01/29/2023)     Readmission Risk Interventions     No data to display

## 2023-01-31 NOTE — Discharge Instructions (Signed)
Follow with Primary MD Verdell Face, DO in 7 days   Get CBC, CMP, A1c, lipid panel -  checked next visit with your primary MD    Activity: As tolerated with Full fall precautions use walker/cane & assistance as needed  Disposition Home    Diet: Heart Healthy   Special Instructions: If you have smoked or chewed Tobacco  in the last 2 yrs please stop smoking, stop any regular Alcohol  and or any Recreational drug use.  On your next visit with your primary care physician please Get Medicines reviewed and adjusted.  Please request your Prim.MD to go over all Hospital Tests and Procedure/Radiological results at the follow up, please get all Hospital records sent to your Prim MD by signing hospital release before you go home.  If you experience worsening of your admission symptoms, develop shortness of breath, life threatening emergency, suicidal or homicidal thoughts you must seek medical attention immediately by calling 911 or calling your MD immediately  if symptoms less severe.  You Must read complete instructions/literature along with all the possible adverse reactions/side effects for all the Medicines you take and that have been prescribed to you. Take any new Medicines after you have completely understood and accpet all the possible adverse reactions/side effects.

## 2023-01-31 NOTE — Plan of Care (Signed)

## 2023-01-31 NOTE — Progress Notes (Signed)
AVS reviewed by RN Delicia. Pt left unit in NAD.

## 2023-01-31 NOTE — Discharge Summary (Addendum)
Morgan Sparks NWG:[id number] DOB: 1951/09/20 DOA: 01/29/2023  PCP: Verdell Face, DO  Admit date: 01/29/2023  Discharge date: 01/31/2023  Admitted From: Home   Disposition:  Home   Recommendations for Outpatient Follow-up:   Follow up with PCP in 1-2 weeks  PCP Please obtain BMP/CBC, 2 view CXR in 1week,  (see Discharge instructions)   PCP Please follow up on the following pending results:    Home Health: None   Equipment/Devices: None  Consultations: Neuro Discharge Condition: Stable    CODE STATUS: Full    Diet Recommendation: Heart Healthy     Chief Complaint  Patient presents with   Numbness     Brief history of present illness from the day of admission and additional interim summary    72 y.o. female past medical history of essential hypertension hyperlipidemia  she relates she got up yesterday morning to go to church and her right upper extremity went numb, workup was consistent with acute ischemic CVA and she was admitted to the hospital.                                                                 Hospital Course    Small acute infarcts at the left superior frontoparietal junction -terms much better and close to baseline, seen by stroke team full stroke workup done under their guidance, she is symptom-free now eager to go home, will be discharged home on dual antiplatelet therapy for 3 weeks thereafter Plavix alone.  Statin has been adjusted for better LDL control.  PCP to monitor secondary risk factors for CVA including A1c, LDL and blood pressure.  She will also follow-up with neurology post discharge. Outpt Loop recorder - Cards aware.   Hypertension.  Permissive hypertension gradually reintroduce home blood pressure medications in the next 1 to 2 days.  Dyslipidemia.  Switch to Crestor for  better control.   Discharge diagnosis     Principal Problem:   Acute CVA (cerebrovascular accident) [facility]) Active Problems:   Essential hypertension   Hyperlipidemia    Discharge instructions    Discharge Instructions     Diet - low sodium heart healthy   Complete by: As directed    Discharge instructions   Complete by: As directed    Follow with Primary MD Verdell Face, DO in 7 days   Get CBC, CMP, A1c, lipid panel -  checked next visit with your primary MD    Activity: As tolerated with Full fall precautions use walker/cane & assistance as needed  Disposition Home    Diet: Heart Healthy   Special Instructions: If you have smoked or chewed Tobacco  in the last 2 yrs please stop smoking, stop any regular Alcohol  and or any Recreational drug use.  On your next visit with your primary care  physician please Get Medicines reviewed and adjusted.  Please request your Prim.MD to go over all Hospital Tests and Procedure/Radiological results at the follow up, please get all Hospital records sent to your Prim MD by signing hospital release before you go home.  If you experience worsening of your admission symptoms, develop shortness of breath, life threatening emergency, suicidal or homicidal thoughts you must seek medical attention immediately by calling 911 or calling your MD immediately  if symptoms less severe.  You Must read complete instructions/literature along with all the possible adverse reactions/side effects for all the Medicines you take and that have been prescribed to you. Take any new Medicines after you have completely understood and accpet all the possible adverse reactions/side effects.   Increase activity slowly   Complete by: As directed        Discharge Medications   Allergies as of 01/31/2023       Reactions   Dilaudid [hydromorphone] Itching   Doxycycline Hives   Strawberry (diagnostic) Hives        Medication List     STOP taking these  medications    ezetimibe-simvastatin 10-20 MG tablet Commonly known as: VYTORIN       TAKE these medications    albuterol 108 (90 Base) MCG/ACT inhaler Commonly known as: VENTOLIN HFA Inhale 2 puffs into the lungs daily as needed for wheezing or shortness of breath.   aspirin 81 MG tablet Take 1 tablet (81 mg total) by mouth 2 (two) times a week. Takes twice a week   Breztri Aerosphere 160-9-4.8 MCG/ACT Aero Generic drug: Budeson-Glycopyrrol-Formoterol Take 2 puffs by mouth in the morning and at bedtime.   clopidogrel 75 MG tablet Commonly known as: PLAVIX Take 1 tablet (75 mg total) by mouth daily.   dicyclomine 10 MG capsule Commonly known as: BENTYL Take 1 capsule (10 mg total) by mouth 2 (two) times daily as needed for spasms.   fluticasone 50 MCG/ACT nasal spray Commonly known as: FLONASE Place 1-2 sprays into both nostrils daily as needed for allergies.   hydrochlorothiazide 25 MG tablet Commonly known as: HYDRODIURIL Take 25 mg by mouth every morning.   olmesartan 20 MG tablet Commonly known as: BENICAR Take 1 tablet (20 mg total) by mouth daily. Start taking on: Feb 02, 2023 What changed: These instructions start on Feb 02, 2023. If you are unsure what to do until then, ask your doctor or other care provider.   omeprazole 20 MG capsule Commonly known as: PRILOSEC Take 1 capsule (20 mg total) by mouth daily.   rosuvastatin 20 MG tablet Commonly known as: CRESTOR Take 1 tablet (20 mg total) by mouth daily.         Follow-up Information     Verdell Face, DO. Schedule an appointment as soon as possible for a visit in 1 week(s).   Specialty: Internal Medicine Contact information: [address] [place] [address] [phone]         [facility]. Schedule an appointment as soon as possible for a visit in 1 week(s).   Contact information: [address]     [address] [place]  [address] [phone]                Major procedures and Radiology Reports - PLEASE review detailed and final reports thoroughly  -       ECHOCARDIOGRAM COMPLETE BUBBLE STUDY  Result Date: 01/30/2023    ECHOCARDIOGRAM REPORT   Patient Name:  Morgan Sparks Date of Exam: 01/30/2023 Medical Rec #:  [id number]     Height:       65.0 in Accession #:    [id number]    Weight:       159.8 lb Date of Birth:  04-23-1951     BSA:          1.798 m Patient Age:    71 years      BP:           155/85 mmHg Patient Gender: F             HR:           79 bpm. Exam Location:  Inpatient Procedure: 2D Echo, Color Doppler, Cardiac Doppler and Saline Contrast Bubble            Study Indications:     Stroke  History:         Patient has no prior history of Echocardiogram examinations.                  Stroke; Risk Factors:Hypertension and Dyslipidemia.  Sonographer:     Milbert Coulter Referring Phys:  [id number] JAN A MANSY Diagnosing Phys: [place] IMPRESSIONS  1. Left ventricular ejection fraction, by estimation, is 60 to 65%. The left ventricle has normal function. The left ventricle has no regional wall motion abnormalities. Left ventricular diastolic parameters are consistent with Grade I diastolic dysfunction (impaired relaxation).  2. Right ventricular systolic function is normal. The right ventricular size is normal. Tricuspid regurgitation signal is inadequate for assessing PA pressure.  3. The mitral valve was not well visualized. No evidence of mitral valve regurgitation.  4. The aortic valve was not well visualized. Aortic valve regurgitation is not visualized.  5. Aortic not well visualized.  6. The inferior vena cava is normal in size with greater than 50% respiratory variability, suggesting right atrial pressure of 3 mmHg. FINDINGS  Left Ventricle: Left ventricular ejection fraction, by estimation, is 60 to 65%. The left ventricle has normal function. The left ventricle has no regional wall motion  abnormalities. The left ventricular internal cavity size was normal in size. There is  no left ventricular hypertrophy. Left ventricular diastolic parameters are consistent with Grade I diastolic dysfunction (impaired relaxation). Right Ventricle: The right ventricular size is normal. Right ventricular systolic function is normal. Tricuspid regurgitation signal is inadequate for assessing PA pressure. Left Atrium: Left atrial size was normal in size. Right Atrium: Right atrial size was normal in size. Pericardium: There is no evidence of pericardial effusion. Mitral Valve: The mitral valve was not well visualized. No evidence of mitral valve regurgitation. Tricuspid Valve: Tricuspid valve regurgitation is not demonstrated. Aortic Valve: The aortic valve was not well visualized. Aortic valve regurgitation is not visualized. Aortic valve mean gradient measures 3.0 mmHg. Aortic valve peak gradient measures 6.2 mmHg. Aortic valve area, by VTI measures 1.83 cm. Pulmonic Valve: The pulmonic valve was not well visualized. Aorta: Not well visualized. Venous: The inferior vena cava is normal in size with greater than 50% respiratory variability, suggesting right atrial pressure of 3 mmHg. IAS/Shunts: The interatrial septum was not well visualized. Agitated saline contrast was given intravenously to evaluate for intracardiac shunting.  LEFT VENTRICLE PLAX 2D LVIDd:         4.00 cm     Diastology LVIDs:         2.40 cm     LV e' medial:    5.22 cm/s LV PW:  0.90 cm     LV E/e' medial:  10.1 LV IVS:        1.00 cm     LV e' lateral:   9.46 cm/s LVOT diam:     1.80 cm     LV E/e' lateral: 5.6 LV SV:         44 LV SV Index:   24 LVOT Area:     2.54 cm  LV Volumes (MOD) LV vol d, MOD A2C: 21.4 ml LV vol d, MOD A4C: 35.1 ml LV vol s, MOD A2C: 8.3 ml LV vol s, MOD A4C: 13.2 ml LV SV MOD A2C:     13.1 ml LV SV MOD A4C:     35.1 ml LV SV MOD BP:      17.4 ml RIGHT VENTRICLE RV Basal diam:  2.90 cm RV Mid diam:    2.50 cm RV S  prime:     11.40 cm/s TAPSE (M-mode): 2.3 cm LEFT ATRIUM             Index       RIGHT ATRIUM          Index LA Vol (A2C):   9.4 ml  5.20 ml/m  RA Area:     9.73 cm LA Vol (A4C):   17.3 ml 9.62 ml/m  RA Volume:   20.40 ml 11.34 ml/m LA Biplane Vol: 13.3 ml 7.40 ml/m  AORTIC VALVE AV Area (Vmax):    1.92 cm AV Area (Vmean):   1.86 cm AV Area (VTI):     1.83 cm AV Vmax:           124.00 cm/s AV Vmean:          83.400 cm/s AV VTI:            0.238 m AV Peak Grad:      6.2 mmHg AV Mean Grad:      3.0 mmHg LVOT Vmax:         93.80 cm/s LVOT Vmean:        60.800 cm/s LVOT VTI:          0.171 m LVOT/AV VTI ratio: 0.72 MITRAL VALVE MV Area (PHT): 4.17 cm     SHUNTS MV Decel Time: 182 msec     Systemic VTI:  0.17 m MV E velocity: 52.90 cm/s   Systemic Diam: 1.80 cm MV A velocity: 104.00 cm/s MV E/A ratio:  0.51 Photographer signed by Carolan Clines Signature Date/Time: 01/30/2023/1:52:41 PM    Final (Updated)    VAS [place] LOWER EXTREMITY VENOUS (DVT)  Result Date: 01/30/2023  Lower Venous DVT Study Patient Name:  Morgan Sparks  Date of Exam:   01/30/2023 Medical Rec #: [id number]      Accession #:    [id number] Date of Birth: 1951-08-04      Patient Gender: F Patient Age:   46 years Exam Location:  [facility] Procedure:      VAS [place] LOWER EXTREMITY VENOUS (DVT) Referring Phys: Scheryl Marten XU --------------------------------------------------------------------------------  Indications: Stroke.  Risk Factors: None identified. Limitations: Poor ultrasound/tissue interface. Comparison Study: No prior studies. Performing Technologist: Chanda Busing RVT  Examination Guidelines: A complete evaluation includes B-mode imaging, spectral Doppler, color Doppler, and power Doppler as needed of all accessible portions of each vessel. Bilateral testing is considered an integral part of a complete examination. Limited examinations for reoccurring indications may be performed as noted. The reflux portion of the exam is  performed with  the patient in reverse Trendelenburg.  +---------+---------------+---------+-----------+----------+-------------------+ RIGHT    CompressibilityPhasicitySpontaneityPropertiesThrombus Aging      +---------+---------------+---------+-----------+----------+-------------------+ CFV      Full           Yes      Yes                                      +---------+---------------+---------+-----------+----------+-------------------+ SFJ      Full                                                             +---------+---------------+---------+-----------+----------+-------------------+ FV Prox  Full                                                             +---------+---------------+---------+-----------+----------+-------------------+ FV Mid   Full                                                             +---------+---------------+---------+-----------+----------+-------------------+ FV DistalFull                                                             +---------+---------------+---------+-----------+----------+-------------------+ PFV      Full                                                             +---------+---------------+---------+-----------+----------+-------------------+ POP      Full           Yes      Yes                                      +---------+---------------+---------+-----------+----------+-------------------+ PTV      Full                                                             +---------+---------------+---------+-----------+----------+-------------------+ PERO                                                  Not well visualized +---------+---------------+---------+-----------+----------+-------------------+   +---------+---------------+---------+-----------+----------+--------------+ LEFT  CompressibilityPhasicitySpontaneityPropertiesThrombus Aging  +---------+---------------+---------+-----------+----------+--------------+ CFV      Full           Yes      Yes                                 +---------+---------------+---------+-----------+----------+--------------+ SFJ      Full                                                        +---------+---------------+---------+-----------+----------+--------------+ FV Prox  Full                                                        +---------+---------------+---------+-----------+----------+--------------+ FV Mid                  Yes      Yes                                 +---------+---------------+---------+-----------+----------+--------------+ FV Distal               Yes      Yes                                 +---------+---------------+---------+-----------+----------+--------------+ PFV      Full                                                        +---------+---------------+---------+-----------+----------+--------------+ POP      Full           Yes      Yes                                 +---------+---------------+---------+-----------+----------+--------------+ PTV      Full                                                        +---------+---------------+---------+-----------+----------+--------------+ PERO     Full                                                        +---------+---------------+---------+-----------+----------+--------------+    Summary: RIGHT: - There is no evidence of deep vein thrombosis in the lower extremity. However, portions of this examination were limited- see technologist comments above.  - No cystic structure found in the popliteal fossa.  LEFT: - There is no evidence of deep vein thrombosis in the lower extremity. However, portions  of this examination were limited- see technologist comments above.  - No cystic structure found in the popliteal fossa.  *See table(s) above for measurements and observations.     Preliminary    MR BRAIN WO CONTRAST  Result Date: 01/30/2023 CLINICAL DATA:  Acute neurologic deficit EXAM: MRI HEAD WITHOUT CONTRAST TECHNIQUE: Multiplanar, multiecho pulse sequences of the brain and surrounding structures were obtained without intravenous contrast. COMPARISON:  None Available. FINDINGS: Brain: Small acute infarcts at the left superior frontoparietal junction. No acute or chronic hemorrhage. There is multifocal hyperintense T2-weighted signal within the white matter. Parenchymal volume and CSF spaces are normal. The midline structures are normal. Vascular: Major flow voids are preserved. Skull and upper cervical spine: Normal calvarium and skull base. Visualized upper cervical spine and soft tissues are normal. Sinuses/Orbits:No paranasal sinus fluid levels or advanced mucosal thickening. No mastoid or middle ear effusion. Normal orbits. IMPRESSION: Small acute infarcts at the left superior frontoparietal junction. No hemorrhage or mass effect. Electronically Signed   By: Deatra Robinson M.D.   On: 01/30/2023 01:34   CT ANGIO HEAD NECK W WO CM  Result Date: 01/29/2023 CLINICAL DATA:  Acute neurologic deficit EXAM: CT ANGIOGRAPHY HEAD AND NECK WITH AND WITHOUT CONTRAST TECHNIQUE: Multidetector CT imaging of the head and neck was performed using the standard protocol during bolus administration of intravenous contrast. Multiplanar CT image reconstructions and MIPs were obtained to evaluate the vascular anatomy. Carotid stenosis measurements (when applicable) are obtained utilizing NASCET criteria, using the distal internal carotid diameter as the denominator. RADIATION DOSE REDUCTION: This exam was performed according to the departmental dose-optimization program which includes automated exposure control, adjustment of the mA and/or kV according to patient size and/or use of iterative reconstruction technique. CONTRAST:  75mL OMNIPAQUE IOHEXOL 350 MG/ML SOLN COMPARISON:  None Available.  FINDINGS: CT HEAD FINDINGS Brain: There is no mass, hemorrhage or extra-axial collection. The size and configuration of the ventricles and extra-axial CSF spaces are normal. There is no acute or chronic infarction. The brain parenchyma is normal. Skull: The visualized skull base, calvarium and extracranial soft tissues are normal. Sinuses/Orbits: No fluid levels or advanced mucosal thickening of the visualized paranasal sinuses. No mastoid or middle ear effusion. The orbits are normal. CTA NECK FINDINGS SKELETON: There is no bony spinal canal stenosis. No lytic or blastic lesion. OTHER NECK: Normal pharynx, larynx and major salivary glands. No cervical lymphadenopathy. Unremarkable thyroid gland. UPPER CHEST: No pneumothorax or pleural effusion. No nodules or masses. AORTIC ARCH: There is calcific atherosclerosis of the aortic arch. There is no aneurysm, dissection or hemodynamically significant stenosis of the visualized portion of the aorta. Conventional 3 vessel aortic branching pattern. The visualized proximal subclavian arteries are widely patent. RIGHT CAROTID SYSTEM: No dissection, occlusion or aneurysm. Mild atherosclerotic calcification at the carotid bifurcation without hemodynamically significant stenosis. LEFT CAROTID SYSTEM: No dissection, occlusion or aneurysm. Mild atherosclerotic calcification at the carotid bifurcation without hemodynamically significant stenosis. VERTEBRAL ARTERIES: Codominant configuration. Both origins are clearly patent. There is no dissection, occlusion or flow-limiting stenosis to the skull base (V1-V3 segments). CTA HEAD FINDINGS POSTERIOR CIRCULATION: --Vertebral arteries: Normal V4 segments. --Inferior cerebellar arteries: Normal. --Basilar artery: Normal. --Superior cerebellar arteries: Normal. --Posterior cerebral arteries (PCA): Normal. ANTERIOR CIRCULATION: --Intracranial internal carotid arteries: Atherosclerotic calcification of the internal carotid arteries at the  skull base without hemodynamically significant stenosis. --Anterior cerebral arteries (ACA): Normal. Both A1 segments are present. Patent anterior communicating artery (a-comm). --Middle cerebral arteries (MCA): Normal. VENOUS SINUSES: As permitted  by contrast timing, patent. ANATOMIC VARIANTS: Fetal origin of the right posterior cerebral artery. Review of the MIP images confirms the above findings. IMPRESSION: 1. No emergent large vessel occlusion or hemodynamically significant stenosis of the head or neck. 2. Mild bilateral carotid bifurcation atherosclerosis without hemodynamically significant stenosis. Aortic atherosclerosis (ICD10-I70.0). Electronically Signed   By: Deatra Robinson M.D.   On: 01/29/2023 19:36   DG Chest Portable 1 View  Result Date: 01/29/2023 CLINICAL DATA:  Shortness of breath. EXAM: PORTABLE CHEST 1 VIEW COMPARISON:  Chest x-ray August 02, 2022 FINDINGS: The heart size and mediastinal contours are within normal limits. Both lungs are clear. The visualized skeletal structures are unremarkable. IMPRESSION: No active disease. Electronically Signed   By: Gerome Sam III M.D.   On: 01/29/2023 17:04    Micro Results     No results found for this or any previous visit (from the past 240 hour(s)).  Today   Subjective    Morgan Sparks today has no headache,no chest abdominal pain,no new weakness tingling or numbness, feels much better wants to go home today.     Objective   Blood pressure (!) 143/91, pulse 77, temperature 98.2 F (36.8 C), temperature source Oral, resp. rate 14, height 5\' 5"  (1.651 m), weight 72.5 kg, SpO2 94 %.   Intake/Output Summary (Last 24 hours) at 01/31/2023 0805 Last data filed at 01/31/2023 0316 Gross per 24 hour  Intake 2121.53 ml  Output --  Net 2121.53 ml    Exam  Awake Alert, No new F.N deficits,    [place].AT,PERRAL Supple Neck,   Symmetrical Chest wall movement, Good air movement bilaterally, CTAB RRR,No Gallops,   +ve B.Sounds, Abd  Soft, Non tender,  No Cyanosis, Clubbing or edema    Data Review   Recent Labs  Lab 01/29/23 1637  WBC 4.8  HGB 12.8  HCT 38.0  PLT 172  MCV 100.5*  MCH 33.9  MCHC 33.7  RDW 13.5  LYMPHSABS 1.5  MONOABS 0.5  EOSABS 0.1  BASOSABS 0.0    Recent Labs  Lab 01/29/23 1637 01/30/23 0538  NA 138  --   K 4.1  --   CL 105  --   CO2 23  --   ANIONGAP 10  --   GLUCOSE 89  --   BUN 17  --   CREATININE 1.20*  --   AST 26  --   ALT 22  --   ALKPHOS 74  --   BILITOT 0.4  --   ALBUMIN 4.1  --   TSH 0.679  --   HGBA1C  --  6.2*  BNP 376.8*  --   CALCIUM 8.9  --    Lab Results  Component Value Date   CHOL 207 (H) 01/31/2023   HDL 105 01/31/2023   LDLCALC 92 01/31/2023   TRIG 50 01/31/2023   CHOLHDL 2.0 01/31/2023    Total Time in preparing paper work, data evaluation and todays exam - 35 minutes  Signature  -    Susa Raring M.D on 01/31/2023 at 8:05 AM   -  To page go to [email or web address]

## 2023-02-11 ENCOUNTER — Ambulatory Visit: Payer: Medicare Other | Admitting: Diagnostic Neuroimaging

## 2023-02-11 ENCOUNTER — Encounter: Payer: Self-pay | Admitting: Diagnostic Neuroimaging

## 2023-02-11 VITALS — BP 144/92 | HR 76 | Ht 65.0 in | Wt 159.8 lb

## 2023-02-11 DIAGNOSIS — I63412 Cerebral infarction due to embolism of left middle cerebral artery: Secondary | ICD-10-CM

## 2023-02-11 MED ORDER — ASPIRIN 81 MG PO TBEC: 81.0000 mg | DELAYED_RELEASE_TABLET | Freq: Every day | ORAL | Status: AC

## 2023-02-11 NOTE — Patient Instructions (Addendum)
LEFT PARIETAL EMBOLIC STROKES - continue aspirin 81 mg daily and clopidogrel 75 mg daily for 3 weeks; then clopidogrel 75mg  daily alone (starting February 21, 2023) - continue rosuvastatin and BP control - follow up implanted loop recorder per cardiology (rule out atrial fibrillation) - optimize nutrition, exercise, sleep

## 2023-02-11 NOTE — Progress Notes (Signed)
[facility]  PATIENT: Morgan Sparks DOB: [birth year]  REFERRING CLINICIAN: Verdell Face, DO HISTORY FROM: patient  REASON FOR VISIT: new consult   HISTORICAL  CHIEF COMPLAINT:  Chief Complaint  Patient presents with   Hospitalization Follow-up    Rm7, alone internal hospital f/u for CVA, pt reports doing well since being home     HISTORY OF PRESENT ILLNESS:   72 year old female here for evaluation of stroke follow-up.  01/29/2023 patient woke up, went to church and noticed sudden onset numbness in right arm around 7 AM.  She presented to hospital at 4 PM for evaluation.  She was found to have small left parietal cortical embolic strokes.  Patient was significantly hypertensive on arrival.  She was admitted for stroke workup.  She was discharged on dual antiplatelet therapy for 3 weeks and then Plavix alone.  She is scheduled for implanted loop recorder in the next couple of weeks.  She is tolerating medications.  Symptoms have improved.   REVIEW OF SYSTEMS: Full 14 system review of systems performed and negative with exception of: as per HPI.  ALLERGIES: Allergies  Allergen Reactions   Dilaudid [Hydromorphone] Itching   Doxycycline Hives   Strawberry (Diagnostic) Hives    HOME MEDICATIONS: Outpatient Medications Prior to Visit  Medication Sig Dispense Refill   albuterol (VENTOLIN HFA) 108 (90 Base) MCG/ACT inhaler Inhale 2 puffs into the lungs daily as needed for wheezing or shortness of breath.      BREZTRI AEROSPHERE 160-9-4.8 MCG/ACT AERO Take 2 puffs by mouth in the morning and at bedtime.     clopidogrel (PLAVIX) 75 MG tablet Take 1 tablet (75 mg total) by mouth daily. 30 tablet 1   dicyclomine (BENTYL) 10 MG capsule Take 1 capsule (10 mg total) by mouth 2 (two) times daily as needed for spasms. 30 capsule 0   fluticasone (FLONASE) 50 MCG/ACT nasal spray Place 1-2 sprays into both nostrils daily as needed for allergies.     olmesartan (BENICAR) 20  MG tablet Take 1 tablet (20 mg total) by mouth daily.     omeprazole (PRILOSEC) 20 MG capsule Take 1 capsule (20 mg total) by mouth daily. 30 capsule 1   rosuvastatin (CRESTOR) 20 MG tablet Take 1 tablet (20 mg total) by mouth daily. 30 tablet 0   aspirin 81 MG tablet Take 1 tablet (81 mg total) by mouth 2 (two) times a week. Takes twice a week (Patient taking differently: Take 81 mg by mouth daily.) 20 tablet 0   hydrochlorothiazide (HYDRODIURIL) 25 MG tablet Take 25 mg by mouth every morning.     No facility-administered medications prior to visit.    PAST MEDICAL HISTORY: Past Medical History:  Diagnosis Date   Diverticulitis    High cholesterol    Stroke [facility])     PAST SURGICAL HISTORY: Past Surgical History:  Procedure Laterality Date   ABDOMINAL HYSTERECTOMY      FAMILY HISTORY: Family History  Problem Relation Age of Onset   Heart attack Father    Ovarian cancer Sister    Breast cancer Sister     SOCIAL HISTORY: Social History   Socioeconomic History   Marital status: Single    Spouse name: Not on file   Number of children: 1   Years of education: Not on file   Highest education level: Not on file  Occupational History   Not on file  Tobacco Use   Smoking status: Former   Smokeless tobacco: Never  Vaping  Use   Vaping Use: Never used  Substance and Sexual Activity   Alcohol use: Yes    Alcohol/week: 1.0 standard drink of alcohol    Types: 1 Glasses of wine per week    Comment: occ   Drug use: Not Currently   Sexual activity: Not Currently  Other Topics Concern   Not on file  Social History Narrative   Not on file   Social Determinants of Health   Financial Resource Strain: Not on file  Food Insecurity: No Food Insecurity (01/30/2023)   Hunger Vital Sign    Worried About Running Out of Food in the Last Year: Never true    Ran Out of Food in the Last Year: Never true  Transportation Needs: No Transportation Needs (01/30/2023)   PRAPARE -  Administrator, Civil Service (Medical): No    Lack of Transportation (Non-Medical): No  Physical Activity: Not on file  Stress: Not on file  Social Connections: Not on file  Intimate Partner Violence: Not At Risk (01/30/2023)   Humiliation, Afraid, Rape, and Kick questionnaire    Fear of Current or Ex-Partner: No    Emotionally Abused: No    Physically Abused: No    Sexually Abused: No     PHYSICAL EXAM  GENERAL EXAM/CONSTITUTIONAL: Vitals:  Vitals:   02/11/23 1024 02/11/23 1029  BP: (!) 156/91 (!) 144/92  Pulse: 70 76  Weight: 159 lb 12.8 oz (72.5 kg)   Height: 5\' 5"  (1.651 m)    Body mass index is 26.59 kg/m. Wt Readings from Last 3 Encounters:  02/11/23 159 lb 12.8 oz (72.5 kg)  01/30/23 159 lb 13.3 oz (72.5 kg)  12/19/22 158 lb 15.2 oz (72.1 kg)   Patient is in no distress; well developed, nourished and groomed; neck is supple  CARDIOVASCULAR: Examination of carotid arteries is normal; no carotid bruits Regular rate and rhythm, no murmurs Examination of peripheral vascular system by observation and palpation is normal  EYES: Ophthalmoscopic exam of optic discs and posterior segments is normal; no papilledema or hemorrhages No results found.  MUSCULOSKELETAL: Gait, strength, tone, movements noted in Neurologic exam below  NEUROLOGIC: MENTAL STATUS:      No data to display         awake, alert, oriented to person, place and time recent and remote memory intact normal attention and concentration language fluent, comprehension intact, naming intact fund of knowledge appropriate  CRANIAL NERVE:  2nd - no papilledema on fundoscopic exam 2nd, 3rd, 4th, 6th - pupils equal and reactive to light, visual fields full to confrontation, extraocular muscles intact, no nystagmus 5th - facial sensation symmetric 7th - facial strength symmetric 8th - hearing intact 9th - palate elevates symmetrically, uvula midline 11th - shoulder shrug symmetric 12th  - tongue protrusion midline  MOTOR:  normal bulk and tone, full strength in the BUE, BLE  SENSORY:  normal and symmetric to light touch, pinprick, temperature, vibration; EXCEPT SLIGHTLY DECR IN RIGHT HAND  COORDINATION:  finger-nose-finger, fine finger movements normal  REFLEXES:  deep tendon reflexes present and symmetric  GAIT/STATION:  narrow based gait    DIAGNOSTIC DATA (LABS, IMAGING, TESTING) - I reviewed patient records, labs, notes, testing and imaging myself where available.  Lab Results  Component Value Date   WBC 4.8 01/29/2023   HGB 12.8 01/29/2023   HCT 38.0 01/29/2023   MCV 100.5 (H) 01/29/2023   PLT 172 01/29/2023      Component Value Date/Time   NA  138 01/29/2023 1637   K 4.1 01/29/2023 1637   CL 105 01/29/2023 1637   CO2 23 01/29/2023 1637   GLUCOSE 89 01/29/2023 1637   BUN 17 01/29/2023 1637   CREATININE 1.20 (H) 01/29/2023 1637   CALCIUM 8.9 01/29/2023 1637   PROT 7.0 01/29/2023 1637   ALBUMIN 4.1 01/29/2023 1637   AST 26 01/29/2023 1637   ALT 22 01/29/2023 1637   ALKPHOS 74 01/29/2023 1637   BILITOT 0.4 01/29/2023 1637   GFRNONAA 49 (L) 01/29/2023 1637   GFRAA >60 03/28/2020 0516   Lab Results  Component Value Date   CHOL 207 (H) 01/31/2023   HDL 105 01/31/2023   LDLCALC 92 01/31/2023   TRIG 50 01/31/2023   CHOLHDL 2.0 01/31/2023   Lab Results  Component Value Date   HGBA1C 6.2 (H) 01/30/2023   No results found for: "VITAMINB12" Lab Results  Component Value Date   TSH 0.679 01/29/2023    HOSPITAL STROKE WORKUP 01/30/23 Dr. Roda Shutters:  "Stroke:  left frontal parietal cortical small infarcts, embolic pattern concerning for cardioembolic source CT no acute finding CTA head and neck unremarkable MRI left frontoparietal small cortical infarcts 2D Echo EF 60 to 65% LE venous Doppler no DVT Recommend loop recorder to rule out A-fib prior to discharge LDL 111 HgbA1c 6.2"    ASSESSMENT AND PLAN  71 y.o. year old female here  with:   Dx:  1. Cerebrovascular accident (CVA) due to embolism of left middle cerebral artery (HCC)     PLAN:  LEFT PARIETAL CORTICAL EMBOLIC STROKES (01/29/23) - continue aspirin 81 mg daily and clopidogrel 75 mg daily for 3 weeks; then clopidogrel 75mg  daily alone (starting February 21, 2023) - continue rosuvastatin and BP control - follow up implanted loop recorder per cardiology (rule out atrial fibrillation) - optimize nutrition, exercise, sleep  Meds ordered this encounter  Medications   aspirin EC 81 MG tablet    Sig: Take 1 tablet (81 mg total) by mouth daily for 10 days. Swallow whole.   Return for return to PCP, pending if symptoms worsen or fail to improve.    Suanne Marker, MD 02/11/2023, 11:01 AM Certified in Neurology, Neurophysiology and Neuroimaging  [facility] [address] [place], [place] [address] [phone]

## 2023-02-21 ENCOUNTER — Ambulatory Visit: Payer: Medicare Other | Attending: Cardiovascular Disease | Admitting: Cardiovascular Disease

## 2023-02-21 ENCOUNTER — Encounter: Payer: Self-pay | Admitting: Cardiovascular Disease

## 2023-02-21 VITALS — BP 130/82 | HR 75 | Ht 65.0 in | Wt 160.8 lb

## 2023-02-21 DIAGNOSIS — I639 Cerebral infarction, unspecified: Secondary | ICD-10-CM

## 2023-02-21 NOTE — Progress Notes (Signed)
Electrophysiology Office Note:    Date:  02/21/2023   ID:  Morgan Sparks, DOB 1950-10-29, MRN [id number]  PCP:  Verdell Face, DO   [facility] Providers Cardiologist:  None     Referring MD: Verdell Face, DO   History of Present Illness:    Morgan Sparks is a 72 y.o. female with a hx listed below, significant for stroke, referred for arrhythmia management.  She has had recurrent admissions for stroke-like symptoms, most recently 01/30/2023. She was noted to have left frontal and parietal cortical infarcts in a pattern concerning for a cardioembolic source. Loop recorder was recommended prior to discharge  She presents to discuss loop recorder to screen for AF per the recommendation of the neurology service.  She has recovered well from her stroke. She does not have palpitations.   Past Medical History:  Diagnosis Date   Diverticulitis    High cholesterol    Stroke [facility])     Past Surgical History:  Procedure Laterality Date   ABDOMINAL HYSTERECTOMY      Current Medications: Current Meds  Medication Sig   albuterol (VENTOLIN HFA) 108 (90 Base) MCG/ACT inhaler Inhale 2 puffs into the lungs daily as needed for wheezing or shortness of breath.    aspirin EC 81 MG tablet Take 1 tablet (81 mg total) by mouth daily for 10 days. Swallow whole.   BREZTRI AEROSPHERE 160-9-4.8 MCG/ACT AERO Take 2 puffs by mouth in the morning and at bedtime.   clopidogrel (PLAVIX) 75 MG tablet Take 1 tablet (75 mg total) by mouth daily.   dicyclomine (BENTYL) 10 MG capsule Take 1 capsule (10 mg total) by mouth 2 (two) times daily as needed for spasms.   fluticasone (FLONASE) 50 MCG/ACT nasal spray Place 1-2 sprays into both nostrils daily as needed for allergies.   olmesartan (BENICAR) 20 MG tablet Take 1 tablet (20 mg total) by mouth daily.   omeprazole (PRILOSEC) 20 MG capsule Take 1 capsule (20 mg total) by mouth daily.   rosuvastatin (CRESTOR) 20 MG tablet Take 1 tablet (20 mg total) by  mouth daily.     Allergies:   Dilaudid [hydromorphone], Doxycycline, and Strawberry (diagnostic)   Social and Family History: Reviewed in Epic  ROS:   Please see the history of present illness.    All other systems reviewed and are negative.  EKGs/Labs/Other Studies Reviewed Today:    Echocardiogram:  TTE 02/21/2023 - reviewed in epic   Monitors:   Stress testing:   Advanced imaging:   Cardiac catherization   EKG:  Last EKG results: today - normal sinus rhythm, 76 bpm   Recent Labs: 12/20/2022: Magnesium 1.7 01/29/2023: ALT 22; B Natriuretic Peptide 376.8; BUN 17; Creatinine, Ser 1.20; Hemoglobin 12.8; Platelets 172; Potassium 4.1; Sodium 138; TSH 0.679     Physical Exam:    VS:  BP 130/82   Pulse 75   Ht 5\' 5"  (1.651 m)   Wt 160 lb 12.8 oz (72.9 kg)   BMI 26.76 kg/m     Wt Readings from Last 3 Encounters:  02/21/23 160 lb 12.8 oz (72.9 kg)  02/11/23 159 lb 12.8 oz (72.5 kg)  01/30/23 159 lb 13.3 oz (72.5 kg)     GEN: Well nourished, well developed in no acute distress CARDIAC: RRR, no murmurs, rubs, gallops RESPIRATORY:  Normal work of breathing MUSCULOSKELETAL: no edema    ASSESSMENT & PLAN:    Recurrent stroke Loop recorder has been requested I explained the indication for the procedure,  the risks, anticipated benefits, and logistics of monitoring.  I informed her of the monthly fee for monitoring.  She would like to proceed  Hypertension No changes today        Medication Adjustments/Labs and Tests Ordered: Current medicines are reviewed at length with the patient today.  Concerns regarding medicines are outlined above.  No orders of the defined types were placed in this encounter.  No orders of the defined types were placed in this encounter.     PROCEDURES:   1. Implantable loop recorder implantation    INTRODUCTION:   Morgan Sparks  is a 72 y.o. patient with a history of cryptogenic stroke. Inpatient telemetry has been reviewed  and not shown atrial fibrillation. The patient therefore presents today for implantable loop implantation. The costs of loop recorder monitoring have been discussed with the patient.    DESCRIPTION OF PROCEDURE:  Informed written consent was obtained.  A timeout was performed. The patient required no sedation for the procedure today.  The patients left chest was prepped and draped in the usual sterile fashion. The skin overlying the left parasternal region was infiltrated with lidocaine for local analgesia.  A 0.5-cm incision was made over the left parasternal region over the 3rd intercostal space.  An implantable loop recorder was then placed into the pocket  R waves were very prominent and measured >0.64mV.  Steri- Strips and a sterile dressing were then applied.  There were no early apparent complications.     CONCLUSIONS:   1. Successful implantation of a [employer] implantable loop recorder (SN [id number]) for a history of cryptogenic stroke  2. No early apparent complications.   Morgan Small, MD  Cardiac Electrophysiology   Signed, Morgan Small, MD  02/21/2023 8:48 AM    [facility]

## 2023-02-21 NOTE — Patient Instructions (Signed)
Medication Instructions:  Your physician recommends that you continue on your current medications as directed. Please refer to the Current Medication list given to you today.  Labwork: None ordered.  Testing/Procedures: None ordered.  Follow-Up:  Your physician wants you to follow-up in: one year with Dr. Augustus Mealor.  You will receive a reminder letter in the mail two months in advance. If you don't receive a letter, please call our office to schedule the follow-up appointment.  Implantable Loop Recorder Placement, Care After This sheet gives you information about how to care for yourself after your procedure. Your health care provider may also give you more specific instructions. If you have problems or questions, contact your health care provider.  What can I expect after the procedure? After the procedure, it is common to have: Soreness or discomfort near the incision. Some swelling or bruising near the incision.  Follow these instructions at home: Incision care  Monitor your cardiac device site for redness, swelling, and drainage. Call the device clinic at [phone] if you experience these symptoms or fever/chills.  Keep the large square bandage on your site for 24 hours and then you may remove it yourself. Keep the steri-strips underneath in place.   You may shower after 72 hours / 3 days from your procedure with the steri-strips in place. They will usually fall off on their own, or may be removed after 10 days. Pat dry.   Avoid lotions, ointments, or perfumes over your incision until it is well-healed.  Please do not submerge in water until your site is completely healed.   Your device is MRI compatible.   Remote monitoring is used to monitor your cardiac device from home. This monitoring is scheduled every month by our office. It allows us to keep an eye on the function of your device to ensure it is working properly.  If your wound site starts to bleed apply  pressure.    For help with the monitor please call [employer] Monitor Support Specialist directly at [phone].    If you have any questions/concerns please call the device clinic at [phone].  Activity  Return to your normal activities.  General instructions Follow instructions from your health care provider about how to manage your implantable loop recorder and transmit the information. Learn how to activate a recording if this is necessary for your type of device. You may go through a metal detection gate, and you may let someone hold a metal detector over your chest. Show your ID card if needed. Do not have an MRI unless you check with your health care provider first. Take over-the-counter and prescription medicines only as told by your health care provider. Keep all follow-up visits as told by your health care provider. This is important. Contact a health care provider if: You have redness, swelling, or pain around your incision. You have a fever. You have pain that is not relieved by your pain medicine. You have triggered your device because of fainting (syncope) or because of a heartbeat that feels like it is racing, slow, fluttering, or skipping (palpitations). Get help right away if you have: Chest pain. Difficulty breathing. Summary After the procedure, it is common to have soreness or discomfort near the incision. Change your dressing as told by your health care provider. Follow instructions from your health care provider about how to manage your implantable loop recorder and transmit the information. Keep all follow-up visits as told by your health care provider. This is important. This information   is not intended to replace advice given to you by your health care provider. Make sure you discuss any questions you have with your health care provider. Document Released: 08/18/2015 Document Revised: 10/22/2017 Document Reviewed: 10/22/2017 Elsevier Patient Education   2020 Elsevier Inc.  

## 2023-03-28 ENCOUNTER — Ambulatory Visit (INDEPENDENT_AMBULATORY_CARE_PROVIDER_SITE_OTHER): Payer: Medicare Other

## 2023-03-28 DIAGNOSIS — I639 Cerebral infarction, unspecified: Secondary | ICD-10-CM | POA: Diagnosis not present

## 2023-03-28 LAB — CUP PACEART REMOTE DEVICE CHECK
Date Time Interrogation Session: 20240706153612
Implantable Pulse Generator Implant Date: 20240603

## 2023-04-14 NOTE — Progress Notes (Signed)
Carelink Summary Report / Loop Recorder 

## 2023-05-02 ENCOUNTER — Ambulatory Visit (INDEPENDENT_AMBULATORY_CARE_PROVIDER_SITE_OTHER): Payer: Medicare Other

## 2023-05-02 DIAGNOSIS — I639 Cerebral infarction, unspecified: Secondary | ICD-10-CM

## 2023-05-17 NOTE — Progress Notes (Signed)
Carelink Summary Report / Loop Recorder 

## 2023-06-06 ENCOUNTER — Ambulatory Visit (INDEPENDENT_AMBULATORY_CARE_PROVIDER_SITE_OTHER): Payer: Medicare Other

## 2023-06-06 DIAGNOSIS — I639 Cerebral infarction, unspecified: Secondary | ICD-10-CM | POA: Diagnosis not present

## 2023-06-07 LAB — CUP PACEART REMOTE DEVICE CHECK
Date Time Interrogation Session: 20240913230157
Implantable Pulse Generator Implant Date: 20240603

## 2023-06-22 NOTE — Progress Notes (Signed)
Carelink Summary Report / Loop Recorder 

## 2023-07-11 ENCOUNTER — Ambulatory Visit: Payer: Medicare Other

## 2023-07-11 DIAGNOSIS — I639 Cerebral infarction, unspecified: Secondary | ICD-10-CM

## 2023-07-12 LAB — CUP PACEART REMOTE DEVICE CHECK
Date Time Interrogation Session: 20241020231945
Implantable Pulse Generator Implant Date: 20240603

## 2023-07-28 NOTE — Progress Notes (Signed)
Carelink Summary Report / Loop Recorder 

## 2023-08-15 ENCOUNTER — Ambulatory Visit (INDEPENDENT_AMBULATORY_CARE_PROVIDER_SITE_OTHER): Payer: Medicare Other

## 2023-08-15 DIAGNOSIS — I639 Cerebral infarction, unspecified: Secondary | ICD-10-CM | POA: Diagnosis not present

## 2023-08-15 LAB — CUP PACEART REMOTE DEVICE CHECK
Date Time Interrogation Session: 20241122230526
Implantable Pulse Generator Implant Date: 20240603

## 2023-09-12 NOTE — Progress Notes (Signed)
Carelink Summary Report / Loop Recorder 

## 2023-09-19 ENCOUNTER — Ambulatory Visit (INDEPENDENT_AMBULATORY_CARE_PROVIDER_SITE_OTHER): Payer: Medicare Other

## 2023-09-19 DIAGNOSIS — I639 Cerebral infarction, unspecified: Secondary | ICD-10-CM | POA: Diagnosis not present

## 2023-09-19 LAB — CUP PACEART REMOTE DEVICE CHECK
Date Time Interrogation Session: 20241229231934
Implantable Pulse Generator Implant Date: 20240603

## 2023-09-23 ENCOUNTER — Telehealth: Payer: Self-pay

## 2023-09-23 ENCOUNTER — Telehealth: Payer: Self-pay | Admitting: *Deleted

## 2023-09-23 NOTE — Telephone Encounter (Signed)
  Patient Consent for Virtual Visit        Morgan Sparks has provided verbal consent on 09/23/2023 for a virtual visit (video or telephone).   CONSENT FOR VIRTUAL VISIT FOR:  Morgan Sparks  By participating in this virtual visit I agree to the following:  I hereby voluntarily request, consent and authorize [facility] and its employed or contracted physicians, physician assistants, nurse practitioners or other licensed health care professionals (the Practitioner), to provide me with telemedicine health care services (the "Services) as deemed necessary by the treating Practitioner. I acknowledge and consent to receive the Services by the Practitioner via telemedicine. I understand that the telemedicine visit will involve communicating with the Practitioner through live audiovisual communication technology and the disclosure of certain medical information by electronic transmission. I acknowledge that I have been given the opportunity to request an in-person assessment or other available alternative prior to the telemedicine visit and am voluntarily participating in the telemedicine visit.  I understand that I have the right to withhold or withdraw my consent to the use of telemedicine in the course of my care at any time, without affecting my right to future care or treatment, and that the Practitioner or I may terminate the telemedicine visit at any time. I understand that I have the right to inspect all information obtained and/or recorded in the course of the telemedicine visit and may receive copies of available information for a reasonable fee.  I understand that some of the potential risks of receiving the Services via telemedicine include:  Delay or interruption in medical evaluation due to technological equipment failure or disruption; Information transmitted may not be sufficient (e.g. poor resolution of images) to allow for appropriate medical decision making by the Practitioner;  and/or  In rare instances, security protocols could fail, causing a breach of personal health information.  Furthermore, I acknowledge that it is my responsibility to provide information about my medical history, conditions and care that is complete and accurate to the best of my ability. I acknowledge that Practitioner's advice, recommendations, and/or decision may be based on factors not within their control, such as incomplete or inaccurate data provided by me or distortions of diagnostic images or specimens that may result from electronic transmissions. I understand that the practice of medicine is not an exact science and that Practitioner makes no warranties or guarantees regarding treatment outcomes. I acknowledge that a copy of this consent can be made available to me via my patient portal [facility] MyChart), or I can request a printed copy by calling the office of [facility].    I understand that my insurance will be billed for this visit.   I have read or had this consent read to me. I understand the contents of this consent, which adequately explains the benefits and risks of the Services being provided via telemedicine.  I have been provided ample opportunity to ask questions regarding this consent and the Services and have had my questions answered to my satisfaction. I give my informed consent for the services to be provided through the use of telemedicine in my medical care

## 2023-09-23 NOTE — Telephone Encounter (Signed)
 1st attempt to reach pt regarding surgical clearance and the need for an TELE appointment.  Left pt a detailed message to call back and get that scheduled.

## 2023-09-23 NOTE — Telephone Encounter (Signed)
 Preop televisit scheduled, med rec and consent done

## 2023-09-23 NOTE — Telephone Encounter (Signed)
 Primary Cardiologist:None   Preoperative team, please contact this patient and set up a phone call appointment for further preoperative risk assessment. Please obtain consent and complete medication review. Thank you for your help.   Patient will need virtual visit for medical clearance. Plavix  is managed by neurology for history of stroke so guidance for holding will need to come from them.   I also confirmed the patient resides in the state of [place] . As per [facility] Medical Board telemedicine laws, the patient must reside in the state in which the provider is licensed.   Morgan EMERSON Bane, NP-C  09/23/2023, 10:05 AM [address], [address] Office [phone] Fax [phone]

## 2023-09-23 NOTE — Telephone Encounter (Signed)
   Pre-operative Risk Assessment    Patient Name: Morgan Sparks  DOB: 04/16/1951 MRN: [id number]   Date of last office visit: 02/21/23 DR. MEALOR Date of next office visit: NONE   Request for Surgical Clearance    Procedure:   COLONOSCOPY/ENDOSCOPY  Date of Surgery:  Clearance TBD                                Surgeon:  DR. DIANNA Surgeon's Group or Practice Name:  [facility] Phone number:  [phone] Fax number:  [phone]   Type of Clearance Requested:   - Medical  - Pharmacy:  Hold Clopidogrel  (Plavix ) ; CALLED OFFICE TO CONFIRM IF NEED TO HOLD; NOT LISTED ON FORM TO BE HELD   Type of Anesthesia:   PROPOFOL   Additional requests/questions:    Signed, Alijah Hyde   09/23/2023, 9:17 AM

## 2023-09-23 NOTE — Telephone Encounter (Signed)
 Patient is returning call. Transferred to [facility], CMA.

## 2023-10-13 ENCOUNTER — Ambulatory Visit: Payer: Medicare Other | Attending: Nurse Practitioner

## 2023-10-13 ENCOUNTER — Telehealth: Payer: Self-pay | Admitting: Diagnostic Neuroimaging

## 2023-10-13 ENCOUNTER — Encounter: Payer: Self-pay | Admitting: Neurology

## 2023-10-13 DIAGNOSIS — Z01810 Encounter for preprocedural cardiovascular examination: Secondary | ICD-10-CM | POA: Diagnosis not present

## 2023-10-13 NOTE — Progress Notes (Signed)
Virtual Visit via Telephone Note   Because of Morgan Sparks's co-morbid illnesses, she is at least at moderate risk for complications without adequate follow up.  This format is felt to be most appropriate for this patient at this time.  The patient did not have access to video technology/had technical difficulties with video requiring transitioning to audio format only (telephone).  All issues noted in this document were discussed and addressed.  No physical exam could be performed with this format.  Please refer to the patient's chart for her consent to telehealth for [facility].  Evaluation Performed:  Preoperative cardiovascular risk assessment _____________   Date:  10/13/2023   Patient ID:  Morgan Sparks, DOB 07-10-1951, MRN [id number] Patient Location:  Home Provider location:   Office  Primary Care Provider:  Verdell Face, DO Primary Cardiologist:  None  Chief Complaint / Patient Profile   73 y.o. 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  History of Present Illness    Morgan Sparks is a 73 y.o. female who presents via audio/video conferencing for a telehealth visit today.  Pt was last seen in cardiology clinic on 02/21/2023 by Dr. Nelly Laurence. At that time Morgan Sparks was seen for loop recorder implant rule out cryptogenic stroke. Paceart report showed no evidence of fibrillation with most recently completed 08/2023. The patient is now pending procedure as outlined above. Since her last visit, she has been doing well with no residual symptoms or complaints from previous stroke.  She is independent of her ADLs and does have some shortness of breath which is related to her COPD and possible deconditioning.  She is active and does cardio exercise and also line [facility] the [facility].  She reports compliance with her current  medications and we will see guidance for holding her Plavix from her PCP.  She denies chest pain, shortness of breath, lower extremity edema, fatigue, palpitations, melena, hematuria, hemoptysis, diaphoresis, weakness, presyncope, syncope, orthopnea, and PND.   Past Medical History    Past Medical History:  Diagnosis Date   Diverticulitis    High cholesterol    Stroke [facility])    Past Surgical History:  Procedure Laterality Date   ABDOMINAL HYSTERECTOMY      Allergies  Allergies  Allergen Reactions   Dilaudid [Hydromorphone] Itching   Doxycycline Hives   Strawberry (Diagnostic) Hives    Home Medications    Prior to Admission medications   Medication Sig Start Date End Date Taking? Authorizing Provider  albuterol (VENTOLIN HFA) 108 (90 Base) MCG/ACT inhaler Inhale 2 puffs into the lungs daily as needed for wheezing or shortness of breath.  12/18/19   [provider]  BREZTRI AEROSPHERE 160-9-4.8 MCG/ACT AERO Take 2 puffs by mouth in the morning and at bedtime. 10/29/22   [provider]  clopidogrel (PLAVIX) 75 MG tablet Take 1 tablet (75 mg total) by mouth daily. 01/31/23   Leroy Sea, MD  dicyclomine (BENTYL) 10 MG capsule Take 1 capsule (10 mg total) by mouth 2 (two) times daily as needed for spasms. 12/21/22   Arnetha Courser, MD  fluticasone (FLONASE) 50 MCG/ACT nasal spray Place 1-2 sprays into both nostrils daily as needed for allergies. 03/07/22   [provider]  olmesartan (BENICAR) 20 MG tablet Take 1 tablet (20 mg total) by mouth daily. 02/02/23   Leroy Sea, MD  omeprazole (  PRILOSEC) 20 MG capsule Take 1 capsule (20 mg total) by mouth daily. 12/21/22   Arnetha Courser, MD  rosuvastatin (CRESTOR) 20 MG tablet Take 1 tablet (20 mg total) by mouth daily. 01/31/23   Leroy Sea, MD    Physical Exam    Vital Signs:  Morgan Sparks does not have vital signs available for review today.  Given telephonic nature of communication, physical  exam is limited. AAOx3. NAD. Normal affect.  Speech and respirations are unlabored.  Accessory Clinical Findings    None  Assessment & Plan    1.  Preoperative Cardiovascular Risk Assessment: -Patient's RCRI score is 0.9% The patient affirms she has been doing well without any new cardiac symptoms. They are able to achieve 7 METS without cardiac limitations. Therefore, based on ACC/AHA guidelines, the patient would be at acceptable risk for the planned procedure without further cardiovascular testing. The patient was advised that if she develops new symptoms prior to surgery to contact our office to arrange for a follow-up visit, and she verbalized understanding.   The patient was advised that if she develops new symptoms prior to surgery to contact our office to arrange for a follow-up visit, and she verbalized understanding.  Plavix is managed by PCP for history of stroke and guidance will come from prescriber regarding holding medication.  A copy of this note will be routed to requesting surgeon.  Time:   Today, I have spent 8 minutes with the patient with telehealth technology discussing medical history, symptoms, and management plan.     Napoleon Form, Leodis Rains, NP  10/13/2023, 7:22 AM

## 2023-10-13 NOTE — Telephone Encounter (Signed)
Called the patient back.  Advised that typically they will send a form for [place] to complete so I think and otherwise I can write a letter indicating she can be cleared for procedure and it will post on mychart. Pt verbalized understanding.

## 2023-10-13 NOTE — Telephone Encounter (Signed)
Pt is preparing for a colonoscopy and needs clearance to be off of her Plavix, she would like a call from RN to discuss.

## 2023-10-24 ENCOUNTER — Ambulatory Visit (INDEPENDENT_AMBULATORY_CARE_PROVIDER_SITE_OTHER): Payer: Medicare Other

## 2023-10-24 DIAGNOSIS — I639 Cerebral infarction, unspecified: Secondary | ICD-10-CM | POA: Diagnosis not present

## 2023-10-24 LAB — CUP PACEART REMOTE DEVICE CHECK
Date Time Interrogation Session: 20250202232118
Implantable Pulse Generator Implant Date: 20240603

## 2023-11-06 ENCOUNTER — Encounter: Payer: Self-pay | Admitting: Cardiovascular Disease

## 2023-11-07 ENCOUNTER — Telehealth: Payer: Self-pay | Admitting: Neurology

## 2023-11-07 NOTE — Telephone Encounter (Signed)
Received a form to be completed for surgical clearance for [facility].   Pt was evaluated 01/2023 for a stroke. She is outside of the 3 mth window. May be able to hold the plavix for 5 days prior to procedure with low/minimal risk of re occurring stroke. Will place form on desk for him to sign.

## 2023-11-28 ENCOUNTER — Ambulatory Visit (INDEPENDENT_AMBULATORY_CARE_PROVIDER_SITE_OTHER): Payer: Medicare Other

## 2023-11-28 DIAGNOSIS — I639 Cerebral infarction, unspecified: Secondary | ICD-10-CM

## 2023-11-28 LAB — CUP PACEART REMOTE DEVICE CHECK
Date Time Interrogation Session: 20250309232038
Implantable Pulse Generator Implant Date: 20240603

## 2023-11-30 NOTE — Progress Notes (Signed)
 [facility] Summary Report / Loop Recorder

## 2023-12-06 ENCOUNTER — Encounter: Payer: Self-pay | Admitting: Cardiovascular Disease

## 2024-01-02 ENCOUNTER — Ambulatory Visit (INDEPENDENT_AMBULATORY_CARE_PROVIDER_SITE_OTHER): Payer: Medicare Other

## 2024-01-02 DIAGNOSIS — I639 Cerebral infarction, unspecified: Secondary | ICD-10-CM

## 2024-01-02 LAB — CUP PACEART REMOTE DEVICE CHECK
Date Time Interrogation Session: 20250413232058
Implantable Pulse Generator Implant Date: 20240603

## 2024-01-05 ENCOUNTER — Telehealth: Payer: Self-pay | Admitting: Cardiovascular Disease

## 2024-01-05 NOTE — Telephone Encounter (Signed)
 Patient wants a call back to discuss removing her loop recorder.

## 2024-01-05 NOTE — Telephone Encounter (Signed)
 Spoke to pt and she reports that she is on a fixed income and she can not afford the monthly monitoring of the loop recorder. She reports that pricing has increased and with that she wants to have the loop recorder removed.

## 2024-01-09 NOTE — Progress Notes (Signed)
 [facility] Summary Report / Loop Recorder

## 2024-01-11 NOTE — Telephone Encounter (Signed)
 All future remote checks cancelled.  Note put in Paceart advising NO future remotes should be scheduled.  Reprogrammed checks to 91 day intervals.  Will follow for alerts only.

## 2024-01-11 NOTE — Telephone Encounter (Signed)
 Spoke with patient about Dr Katheryne Pane recommendation, she would like to remove monthly monitoring on there ILR but will keep it in place to have checks completed at future office visits. No further needs

## 2024-01-17 ENCOUNTER — Encounter: Payer: Self-pay | Admitting: Cardiovascular Disease

## 2024-02-10 NOTE — Telephone Encounter (Signed)
 Outreach made to Pt.  Advised Pt last billing for her remote monitor transmissions would be the 01/02/2024 billing cycle that occurred prior to Pt advising she could not afford the cost.  Pt states her bill states she was charged something on 01/28/2024.  Advised Pt would attempt to get additional information for her.  Will contact billing rep and follow up.

## 2024-02-10 NOTE — Telephone Encounter (Signed)
 Pt requesting a cb due to receiving another bill and the checks were supposed to be stopped

## 2024-02-10 NOTE — Telephone Encounter (Signed)
 Follow-up next week.

## 2024-02-22 NOTE — Progress Notes (Signed)
 [facility] Summary Report / Loop Recorder

## 2024-04-02 ENCOUNTER — Other Ambulatory Visit: Payer: Self-pay

## 2024-04-12 ENCOUNTER — Ambulatory Visit: Admitting: Cardiovascular Disease

## 2024-04-12 ENCOUNTER — Encounter: Payer: Self-pay | Admitting: Cardiovascular Disease

## 2024-04-12 VITALS — BP 130/92 | HR 79 | Ht 65.0 in | Wt 157.0 lb

## 2024-04-12 DIAGNOSIS — R9431 Abnormal electrocardiogram [ECG] [EKG]: Secondary | ICD-10-CM

## 2024-04-12 DIAGNOSIS — I639 Cerebral infarction, unspecified: Secondary | ICD-10-CM | POA: Diagnosis not present

## 2024-04-12 NOTE — Progress Notes (Signed)
  Electrophysiology Office Note:    Date:  04/12/2024   ID:  Morgan Sparks, DOB 1951-01-30, MRN [id number]  PCP:  Zena Frederick, DO   [facility] Providers Cardiologist:  None Electrophysiologist:  Eulas FORBES Furbish, MD     Referring MD: Zena Frederick, DO   History of Present Illness:    Morgan Sparks is a 73 y.o. female with a hx listed below, significant for stroke, referred for arrhythmia management.  She has had recurrent admissions for stroke-like symptoms, most recently 01/30/2023. She was noted to have left frontal and parietal cortical infarcts in a pattern concerning for a cardioembolic source. Loop recorder was recommended prior to discharge  She presents today for follow-up.  A loop recorder was placed at our last visit 6/33/2024.  She has recovered well from her stroke. She does not have palpitations.  Loop recorder was implanted in her last visit.  She has not had any AF notifications.  Her insurance changed this spring resulting in a $40 monthly fee.  We switched her monitoring to alerts only which has eliminated the fee.         EKGs/Labs/Other Studies Reviewed Today:    Echocardiogram:  TTE 02/21/2023 - reviewed in epic   Monitors:   Stress testing:   Advanced imaging:   Cardiac catherization   EKG:         Recent Labs: No results found for requested labs within last 365 days.     Physical Exam:    VS:  BP (!) 130/92 (BP Location: Right Arm, Patient Position: Sitting, Cuff Size: Normal)   Pulse 79   Ht 5' 5 (1.651 m)   Wt 157 lb (71.2 kg)   SpO2 90%   BMI 26.13 kg/m     Wt Readings from Last 3 Encounters:  04/12/24 157 lb (71.2 kg)  02/21/23 160 lb 12.8 oz (72.9 kg)  02/11/23 159 lb 12.8 oz (72.5 kg)     GEN: Well nourished, well developed in no acute distress CARDIAC: RRR, no murmurs, rubs, gallops RESPIRATORY:  Normal work of breathing MUSCULOSKELETAL: no edema    ASSESSMENT & PLAN:    Recurrent stroke Monitoring  with loop recorder She has requested that we terminate monthly reports due to the expense We have reprogrammed her device for alerts only  Hypertension No changes today        Medication Adjustments/Labs and Tests Ordered: Current medicines are reviewed at length with the patient today.  Concerns regarding medicines are outlined above.  Orders Placed This Encounter  Procedures   EKG 12-Lead   No orders of the defined types were placed in this encounter.      Signed, Eulas FORBES Furbish, MD  04/12/2024 9:50 AM    [facility]

## 2024-04-12 NOTE — Patient Instructions (Signed)
 Medication Instructions:  Your physician recommends that you continue on your current medications as directed. Please refer to the Current Medication list given to you today.  *If you need a refill on your cardiac medications before your next appointment, please call your pharmacy*  Lab Work: None ordered.  You may go to any [facility] Location for your lab work:  [facility] - [address] ([facility]) - [address] [phone] [address]  [place] - [address]   [place]  - [address]   [place] - [address] - [address] [employer]  - [address] [place] - [address] ([facility]   If you have labs (blood work) drawn today and your tests are completely normal, you will receive your results only by: [employer] (if you have MyChart)  If you have any lab test that is abnormal or we need to change your treatment, we will call you or send a MyChart message to review the results.  Testing/Procedures: None ordered.  Follow-Up: At [facility], you and your health needs are our priority.  As part of our continuing mission to provide you with exceptional heart care, we have created designated Provider Care Teams.  These Care Teams include your primary Cardiologist (physician) and Advanced Practice Providers (APPs -  Physician Assistants and Nurse Practitioners) who all work together to provide you with the care you need, when you need it.  We recommend signing up for the patient portal called MyChart.  Sign up information is provided on this After Visit Summary.  MyChart is used to connect with patients for Virtual Visits (Telemedicine).  Patients are able to view lab/test results, encounter notes, upcoming appointments, etc.  Non-urgent messages can be sent to your provider as well.   To learn more about what you can do with MyChart, go to  [email or web address].    Your next appointment:   1 year(s)  The format for your next appointment:   In Person  Provider:   Advanced Practice Providers on your designated Care Team:   Charlies Arthur, PA-C Michael Andy Tillery, PA-C Brandy Ollis, NP  Note: Remote monitoring is used to monitor your Pacemaker/ ICD from home. This monitoring reduces the number of office visits required to check your device to one time per year. It allows us  to keep an eye on the functioning of your device to ensure it is working properly.

## 2024-07-01 ENCOUNTER — Ambulatory Visit: Attending: Cardiovascular Disease

## 2024-07-03 LAB — CUP PACEART REMOTE DEVICE CHECK
Date Time Interrogation Session: 20251012234915
Implantable Pulse Generator Implant Date: 20240603

## 2024-07-05 ENCOUNTER — Ambulatory Visit: Payer: Self-pay | Admitting: Cardiovascular Disease

## 2024-07-09 NOTE — Telephone Encounter (Signed)
 Order sent.

## 2024-07-30 NOTE — Progress Notes (Signed)
 [facility] Pulmonary and Critical Care Medicine   Patient Name: Morgan Sparks, Nov 22, 1950  Synopsis: Referred in 05/2024 for an abnormal screening CT chest showing a nodule, bronchiectasis. Has a reported history of asthma and chronic cough  Former smoker Childhood pneumonia MAI colonization IgG subclass 2 deficiency  CC: follow   HPI: History of Present Illness Elderly female with COPD and bronchiectasis. Recent sputum culture grew MAI.  COPD and Bronchiectasis - Significant improvement with Trelegy - No current cough or morning expectoration - Dyspnea during physical activities resolved - Occasional nebulizer use, none this week  History of Pneumonia - History of pneumonia in youth  Vaccinations - Abstained from smoking - Did not receive influenza vaccine this year due to coughing episodes - Received Prevnar 20 vaccine - Not yet received RSV vaccine  SOCIAL HISTORY Quit smoking.     Medical History[1] Surgical History[2]    Exam: Vitals:   07/31/24 0906  BP: 156/84  Pulse: 92  Resp: 20  Temp: 97.4 F (36.3 C)  SpO2: 98%    Gen: well appearing, no acute distress HEENT: NCAT OP clear EOMi PULM: Bronchial breath sounds B, normal effort CV: RRR, no mgr GI: BS+, soft, nontender MSK: normal bulk and tone Neuro: awake and alert, MAEW    STUDIES Imaging: 02/21/2024 LDCT chest> RADS 4A, suspicious, hepatic steatosis, coronary artery disase, moderate diffuse bronchiectasis, mild centrilobular emphysema  07/06/2024 LDCT chest > suspect infectious/inflammatory findings.   Labs: CBC 01/29/2023: WBC 4.8 with 0.1 absolute eosinophil count  06/29/2024 alpha 1 antitrypsin normal (MM), IgG subclasses showed IgG 2 level is low (66 and 76 mg/dL  Path:   Micro: 0/76/7974 sputum AFB MAI 06/12/2024 sputum culture normal flora 06/12/2024 sputum fungal culture Candida species  PFT: Spirometry (06/12/2024): FEV1/FVC 42%.  FEV1 40% predicted.   FVC 76% predicted   Echo: TTE (06/01/2022): LVEF 55 to 60% with grade 1A diastolic dysfunction   Heart Cath:   Sleep study:   Ambulatory oximetry:  Impression: Diagnoses and all orders for this visit: Bronchiectasis without complication    (CMD) Centrilobular emphysema IgG2 deficiency    (CMD) MAI (mycobacterium avium-intracellulare)    (CMD) Pulmonary nodule   In general, Morgan Sparks is doing much better compared to her last visit.  Trelegy has helped her feel less short of breath.  She no longer feels sick, she has much less mucus production and she denies shortness of breath.  She is someone who has a fairly complicated lung history with childhood pneumonia, persistent bronchiectasis and nodular abnormalities.  Sputum cultures have grown MAI.  Fortunately, she currently has no symptoms which would prompt us  to consider treating the MAI, nor is she enthusiastic about undergoing treatment right now.  I believe the MAI is the most likely reason why she has the scattered pulmonary nodules which tend to wax and wane.  Further complicating matters are centrilobular emphysema from prior cigarette smoking and IgG subclass 2 deficiency.  Some immunology experts feel that this lab abnormality is not relevant, however there is a fairly decent sized body of literature which would suggest that it can be clinically relevant.  As she is currently asymptomatic and has no respiratory complaint I advised that we will hold off on working this up further at this point but we may need to revisit that in the future.  Plan: Assessment & Plan 1. Chronic Obstructive Pulmonary Disease (COPD): Stable. CT scan shows emphysema in both lungs. - Continue Trelegy. - Avoid infections. - Maintain  up-to-date pneumonia vaccines. - Receive influenza vaccine today. - Inform clinic if ill.  2. Bronchiectasis: Chronic. CT scan shows bronchiectasis in lower lobes, possibly from severe respiratory infection or pneumonia in  youth. Low IgG subclass 2 increases risk of lung infections. Sputum culture shows MAI. - Intensive treatment with three antibiotics for 12-18 months if condition worsens. - Monitor for increased breathing difficulties, chest infections, coughing, or mucus production.  3. Health Maintenance. - Receive influenza vaccine today. - Consider RSV vaccine in 3-4 months.  Follow-up in 6 months.    Current Outpatient Medications  Medication Instructions  . albuterol  HFA (PROVENTIL  HFA;VENTOLIN  HFA;PROAIR  HFA) 90 mcg/actuation inhaler   . aspirin  81 mg  . clopidogreL  (PLAVIX ) 75 mg, Daily  . ergocalciferol (VITAMIN D2) 50,000 Units, Weekly  . ezetimibe -simvastatin  (VYTORIN ) 10-20 mg tab per tablet 1 tablet, Daily  . fluticasone  propionate (FLONASE ) 50 mcg/spray nasal spray   . fluticasone -umeclidinium-vilanterol (Trelegy Ellipta) 100-62.5-25 mcg inhaler 1 puff, inhalation, Daily RT  . hydrALAZINE  (APRESOLINE ) 50 mg, oral, 3 times daily  . olmesartan  (BENICAR ) 20 mg, Daily  . simvastatin  (ZOCOR ) 40 mg, At bedtime   Immunization History  Administered Date(s) Administered  . Pfizer SARS-CoV-2 Primary Series 12+ yrs 11/02/2019, 11/25/2019    Thresa Lennert MD [facility] Pulmonary and Critical Care  Mobile [phone] Office: [phone]       [1] No past medical history on file. [2] Past Surgical History: Procedure Laterality Date  . HYSTERECTOMY      Procedure: HYSTERECTOMY; age 62's

## 2024-08-14 ENCOUNTER — Encounter (HOSPITAL_COMMUNITY)
Admission: EM | Disposition: A | Discharge status: Home / Self Care | Payer: Self-pay | Source: Ambulatory Visit | Attending: Cardiology

## 2024-08-14 ENCOUNTER — Other Ambulatory Visit: Payer: Self-pay

## 2024-08-14 ENCOUNTER — Encounter (HOSPITAL_BASED_OUTPATIENT_CLINIC_OR_DEPARTMENT_OTHER): Payer: Self-pay | Admitting: Emergency Medicine

## 2024-08-14 ENCOUNTER — Emergency Department (HOSPITAL_BASED_OUTPATIENT_CLINIC_OR_DEPARTMENT_OTHER)

## 2024-08-14 ENCOUNTER — Inpatient Hospital Stay (HOSPITAL_BASED_OUTPATIENT_CLINIC_OR_DEPARTMENT_OTHER)
Admission: EM | Admit: 2024-08-14 | Discharge: 2024-08-15 | DRG: 322 | Disposition: A | Discharge status: Home / Self Care | Source: Ambulatory Visit | Attending: Cardiology | Admitting: Cardiology

## 2024-08-14 DIAGNOSIS — Z8041 Family history of malignant neoplasm of ovary: Secondary | ICD-10-CM

## 2024-08-14 DIAGNOSIS — I25110 Atherosclerotic heart disease of native coronary artery with unstable angina pectoris: Secondary | ICD-10-CM | POA: Diagnosis present

## 2024-08-14 DIAGNOSIS — Z8673 Personal history of transient ischemic attack (TIA), and cerebral infarction without residual deficits: Secondary | ICD-10-CM | POA: Diagnosis not present

## 2024-08-14 DIAGNOSIS — J4489 Other specified chronic obstructive pulmonary disease: Secondary | ICD-10-CM | POA: Diagnosis present

## 2024-08-14 DIAGNOSIS — I200 Unstable angina: Secondary | ICD-10-CM

## 2024-08-14 DIAGNOSIS — I2510 Atherosclerotic heart disease of native coronary artery without angina pectoris: Secondary | ICD-10-CM | POA: Diagnosis present

## 2024-08-14 DIAGNOSIS — I4510 Unspecified right bundle-branch block: Secondary | ICD-10-CM | POA: Diagnosis present

## 2024-08-14 DIAGNOSIS — Z803 Family history of malignant neoplasm of breast: Secondary | ICD-10-CM

## 2024-08-14 DIAGNOSIS — Z7902 Long term (current) use of antithrombotics/antiplatelets: Secondary | ICD-10-CM

## 2024-08-14 DIAGNOSIS — Z87891 Personal history of nicotine dependence: Secondary | ICD-10-CM

## 2024-08-14 DIAGNOSIS — A310 Pulmonary mycobacterial infection: Secondary | ICD-10-CM | POA: Diagnosis present

## 2024-08-14 DIAGNOSIS — J479 Bronchiectasis, uncomplicated: Secondary | ICD-10-CM | POA: Diagnosis present

## 2024-08-14 DIAGNOSIS — I249 Acute ischemic heart disease, unspecified: Principal | ICD-10-CM

## 2024-08-14 DIAGNOSIS — I10 Essential (primary) hypertension: Secondary | ICD-10-CM | POA: Diagnosis not present

## 2024-08-14 DIAGNOSIS — N1831 Chronic kidney disease, stage 3a: Secondary | ICD-10-CM | POA: Diagnosis present

## 2024-08-14 DIAGNOSIS — Z955 Presence of coronary angioplasty implant and graft: Secondary | ICD-10-CM

## 2024-08-14 DIAGNOSIS — Z881 Allergy status to other antibiotic agents status: Secondary | ICD-10-CM

## 2024-08-14 DIAGNOSIS — R9431 Abnormal electrocardiogram [ECG] [EKG]: Secondary | ICD-10-CM

## 2024-08-14 DIAGNOSIS — Z90710 Acquired absence of both cervix and uterus: Secondary | ICD-10-CM

## 2024-08-14 DIAGNOSIS — E785 Hyperlipidemia, unspecified: Secondary | ICD-10-CM | POA: Diagnosis present

## 2024-08-14 DIAGNOSIS — D539 Nutritional anemia, unspecified: Secondary | ICD-10-CM | POA: Diagnosis present

## 2024-08-14 DIAGNOSIS — E876 Hypokalemia: Secondary | ICD-10-CM | POA: Diagnosis present

## 2024-08-14 DIAGNOSIS — Z79899 Other long term (current) drug therapy: Secondary | ICD-10-CM

## 2024-08-14 DIAGNOSIS — R911 Solitary pulmonary nodule: Secondary | ICD-10-CM | POA: Diagnosis present

## 2024-08-14 DIAGNOSIS — Z885 Allergy status to narcotic agent status: Secondary | ICD-10-CM

## 2024-08-14 DIAGNOSIS — I214 Non-ST elevation (NSTEMI) myocardial infarction: Principal | ICD-10-CM | POA: Diagnosis present

## 2024-08-14 DIAGNOSIS — E7800 Pure hypercholesterolemia, unspecified: Secondary | ICD-10-CM | POA: Diagnosis not present

## 2024-08-14 DIAGNOSIS — Z91018 Allergy to other foods: Secondary | ICD-10-CM

## 2024-08-14 DIAGNOSIS — Z8249 Family history of ischemic heart disease and other diseases of the circulatory system: Secondary | ICD-10-CM

## 2024-08-14 HISTORY — PX: LEFT HEART CATH AND CORONARY ANGIOGRAPHY: CATH118249

## 2024-08-14 HISTORY — PX: CORONARY STENT INTERVENTION: CATH118234

## 2024-08-14 LAB — CBC
HCT: 40.8 % (ref 36.0–46.0)
Hemoglobin: 13.7 g/dL (ref 12.0–15.0)
MCH: 37 pg — ABNORMAL HIGH (ref 26.0–34.0)
MCHC: 33.6 g/dL (ref 30.0–36.0)
MCV: 110.3 fL — ABNORMAL HIGH (ref 80.0–100.0)
Platelets: 225 K/uL (ref 150–400)
RBC: 3.7 MIL/uL — ABNORMAL LOW (ref 3.87–5.11)
RDW: 17.5 % — ABNORMAL HIGH (ref 11.5–15.5)
WBC: 3.9 K/uL — ABNORMAL LOW (ref 4.0–10.5)
nRBC: 0 % (ref 0.0–0.2)

## 2024-08-14 LAB — SURGICAL PCR SCREEN
MRSA, PCR: NEGATIVE
Staphylococcus aureus: NEGATIVE

## 2024-08-14 LAB — LIPID PANEL
Cholesterol: 216 mg/dL — ABNORMAL HIGH (ref 0–200)
HDL: 115 mg/dL (ref >40)
LDL Cholesterol: 83 mg/dL (ref 0–99)
Total CHOL/HDL Ratio: 1.9 ratio
Triglycerides: 90 mg/dL (ref <150)
VLDL: 18 mg/dL (ref 0–40)

## 2024-08-14 LAB — BASIC METABOLIC PANEL WITH GFR
Anion gap: 14 (ref 5–15)
BUN: 9 mg/dL (ref 8–23)
CO2: 27 mmol/L (ref 22–32)
Calcium: 9.7 mg/dL (ref 8.9–10.3)
Chloride: 101 mmol/L (ref 98–111)
Creatinine, Ser: 0.88 mg/dL (ref 0.44–1.00)
GFR, Estimated: >60 mL/min (ref >60)
Glucose, Bld: 89 mg/dL (ref 70–99)
Potassium: 3 mmol/L — ABNORMAL LOW (ref 3.5–5.1)
Sodium: 142 mmol/L (ref 135–145)

## 2024-08-14 LAB — LACTIC ACID, PLASMA: Lactic Acid, Venous: 1.5 mmol/L (ref 0.5–1.9)

## 2024-08-14 LAB — TROPONIN T, HIGH SENSITIVITY: Troponin T High Sensitivity: <15 ng/L (ref 0–19)

## 2024-08-14 LAB — APTT: aPTT: 25 s (ref 24–36)

## 2024-08-14 LAB — HEMOGLOBIN A1C
Hgb A1c MFr Bld: 5.7 % — ABNORMAL HIGH (ref 4.8–5.6)
Mean Plasma Glucose: 116.89 mg/dL

## 2024-08-14 LAB — POCT ACTIVATED CLOTTING TIME
Activated Clotting Time: 297 s
Activated Clotting Time: 302 s

## 2024-08-14 LAB — PROTIME-INR
INR: 1 (ref 0.8–1.2)
Prothrombin Time: 13.7 s (ref 11.4–15.2)

## 2024-08-14 SURGERY — LEFT HEART CATH AND CORONARY ANGIOGRAPHY
Anesthesia: LOCAL

## 2024-08-14 MED ORDER — MUPIROCIN 2 % EX OINT
1.0000 | TOPICAL_OINTMENT | Freq: Two times a day (BID) | CUTANEOUS | Status: DC
  Administered 2024-08-14 – 2024-08-15 (×2): 1 via NASAL
  Filled 2024-08-14: qty 22

## 2024-08-14 MED ORDER — VERAPAMIL HCL 2.5 MG/ML IV SOLN
INTRAVENOUS | Status: AC
  Filled 2024-08-14: qty 2

## 2024-08-14 MED ORDER — ACETAMINOPHEN 325 MG PO TABS
650.0000 mg | ORAL_TABLET | ORAL | Status: DC | PRN
  Administered 2024-08-14: 650 mg via ORAL
  Filled 2024-08-14: qty 2

## 2024-08-14 MED ORDER — TICAGRELOR 90 MG PO TABS
ORAL_TABLET | ORAL | Status: DC | PRN
  Administered 2024-08-14: 180 mg via ORAL

## 2024-08-14 MED ORDER — LORAZEPAM 2 MG/ML IJ SOLN: 0.5000 mg | Freq: Once | INTRAMUSCULAR | Status: DC | PRN

## 2024-08-14 MED ORDER — OXYCODONE HCL 5 MG PO TABS: 5.0000 mg | ORAL_TABLET | Freq: Two times a day (BID) | ORAL | Status: DC | PRN

## 2024-08-14 MED ORDER — LIDOCAINE HCL (PF) 1 % IJ SOLN
INTRAMUSCULAR | Status: AC
  Filled 2024-08-14: qty 30

## 2024-08-14 MED ORDER — LIDOCAINE HCL (PF) 1 % IJ SOLN
INTRAMUSCULAR | Status: DC | PRN
  Administered 2024-08-14: 2 mL via INTRADERMAL

## 2024-08-14 MED ORDER — NITROGLYCERIN 0.4 MG SL SUBL
SUBLINGUAL_TABLET | SUBLINGUAL | Status: AC
Start: 2024-08-14 — End: 2024-08-14
  Filled 2024-08-14: qty 2

## 2024-08-14 MED ORDER — HEPARIN (PORCINE) IN NACL 1000-0.9 UT/500ML-% IV SOLN
INTRAVENOUS | Status: DC | PRN
  Administered 2024-08-14 (×2): 500 mL

## 2024-08-14 MED ORDER — IRBESARTAN 150 MG PO TABS
150.0000 mg | ORAL_TABLET | Freq: Every day | ORAL | Status: DC
  Administered 2024-08-14 – 2024-08-15 (×2): 150 mg via ORAL
  Filled 2024-08-14 (×2): qty 1

## 2024-08-14 MED ORDER — HEPARIN SODIUM (PORCINE) 1000 UNIT/ML IJ SOLN
INTRAMUSCULAR | Status: DC | PRN
  Administered 2024-08-14: 2000 [IU] via INTRAVENOUS
  Administered 2024-08-14: 6000 [IU] via INTRAVENOUS

## 2024-08-14 MED ORDER — HYDRALAZINE HCL 20 MG/ML IJ SOLN
5.0000 mg | INTRAMUSCULAR | Status: AC | PRN
  Filled 2024-08-14: qty 1

## 2024-08-14 MED ORDER — SODIUM CHLORIDE 0.9 % IV SOLN
250.0000 mL | INTRAVENOUS | Status: DC | PRN
Start: 2024-08-14 — End: 2024-08-15

## 2024-08-14 MED ORDER — ISOSORBIDE MONONITRATE ER 30 MG PO TB24
30.0000 mg | ORAL_TABLET | Freq: Every day | ORAL | Status: DC
  Administered 2024-08-14 – 2024-08-15 (×2): 30 mg via ORAL
  Filled 2024-08-14 (×2): qty 1

## 2024-08-14 MED ORDER — OXYCODONE HCL 5 MG PO TABS: 5.0000 mg | ORAL_TABLET | ORAL | Status: DC | PRN

## 2024-08-14 MED ORDER — FREE WATER
500.0000 mL | Freq: Once | Status: AC
  Administered 2024-08-14: 500 mL via ORAL

## 2024-08-14 MED ORDER — HEPARIN SODIUM (PORCINE) 5000 UNIT/ML IJ SOLN
4000.0000 [IU] | Freq: Once | INTRAMUSCULAR | Status: AC
  Administered 2024-08-14: 4000 [IU] via INTRAVENOUS

## 2024-08-14 MED ORDER — PANTOPRAZOLE SODIUM 40 MG PO TBEC
40.0000 mg | DELAYED_RELEASE_TABLET | Freq: Every day | ORAL | Status: DC
  Administered 2024-08-14 – 2024-08-15 (×2): 40 mg via ORAL
  Filled 2024-08-14 (×2): qty 1

## 2024-08-14 MED ORDER — SODIUM CHLORIDE 0.9% FLUSH: 3.0000 mL | INTRAVENOUS | Status: DC | PRN

## 2024-08-14 MED ORDER — OXYCODONE-ACETAMINOPHEN 5-325 MG PO TABS
1.0000 | ORAL_TABLET | ORAL | Status: DC | PRN
Start: 2024-08-14 — End: 2024-08-15
  Administered 2024-08-14: 1 via ORAL
  Filled 2024-08-14: qty 1

## 2024-08-14 MED ORDER — VERAPAMIL HCL 2.5 MG/ML IV SOLN
INTRAVENOUS | Status: DC | PRN
  Administered 2024-08-14: 10 mL via INTRA_ARTERIAL

## 2024-08-14 MED ORDER — NITROGLYCERIN 0.4 MG SL SUBL
0.4000 mg | SUBLINGUAL_TABLET | SUBLINGUAL | Status: DC | PRN
  Administered 2024-08-14: 0.4 mg via SUBLINGUAL
  Filled 2024-08-14: qty 1

## 2024-08-14 MED ORDER — ONDANSETRON HCL 4 MG/2ML IJ SOLN: 4.0000 mg | Freq: Four times a day (QID) | INTRAMUSCULAR | Status: DC | PRN

## 2024-08-14 MED ORDER — HEPARIN SODIUM (PORCINE) 5000 UNIT/ML IJ SOLN
60.0000 [IU]/kg | Freq: Once | INTRAMUSCULAR | Status: DC
  Filled 2024-08-14: qty 1

## 2024-08-14 MED ORDER — SODIUM CHLORIDE 0.9 % IV SOLN: INTRAVENOUS | Status: DC

## 2024-08-14 MED ORDER — IOHEXOL 350 MG/ML SOLN
INTRAVENOUS | Status: DC | PRN
  Administered 2024-08-14: 150 mL

## 2024-08-14 MED ORDER — METOPROLOL TARTRATE 50 MG PO TABS
50.0000 mg | ORAL_TABLET | Freq: Two times a day (BID) | ORAL | Status: DC
  Administered 2024-08-14: 50 mg via ORAL
  Filled 2024-08-14: qty 1

## 2024-08-14 MED ORDER — HEPARIN SODIUM (PORCINE) 1000 UNIT/ML IJ SOLN
INTRAMUSCULAR | Status: AC
  Filled 2024-08-14: qty 10

## 2024-08-14 MED ORDER — LABETALOL HCL 5 MG/ML IV SOLN
INTRAVENOUS | Status: AC
  Filled 2024-08-14: qty 4

## 2024-08-14 MED ORDER — HEPARIN (PORCINE) 25000 UT/250ML-% IV SOLN
900.0000 [IU]/h | INTRAVENOUS | Status: DC
  Administered 2024-08-14: 900 [IU]/h via INTRAVENOUS
  Filled 2024-08-14 (×2): qty 250

## 2024-08-14 MED ORDER — ASPIRIN 81 MG PO CHEW
81.0000 mg | CHEWABLE_TABLET | Freq: Every day | ORAL | Status: DC
  Administered 2024-08-15: 81 mg via ORAL
  Filled 2024-08-14: qty 1

## 2024-08-14 MED ORDER — ROSUVASTATIN CALCIUM 20 MG PO TABS
20.0000 mg | ORAL_TABLET | Freq: Every day | ORAL | Status: DC
  Administered 2024-08-14: 20 mg via ORAL
  Filled 2024-08-14: qty 1

## 2024-08-14 MED ORDER — LABETALOL HCL 5 MG/ML IV SOLN
10.0000 mg | Freq: Once | INTRAVENOUS | Status: DC
  Filled 2024-08-14: qty 4

## 2024-08-14 MED ORDER — MELATONIN 3 MG PO TABS
3.0000 mg | ORAL_TABLET | Freq: Every day | ORAL | Status: DC
  Administered 2024-08-14: 3 mg via ORAL
  Filled 2024-08-14 (×2): qty 1

## 2024-08-14 MED ORDER — SODIUM CHLORIDE 0.9% FLUSH
3.0000 mL | Freq: Two times a day (BID) | INTRAVENOUS | Status: DC
  Administered 2024-08-14 – 2024-08-15 (×2): 3 mL via INTRAVENOUS

## 2024-08-14 MED ORDER — TICAGRELOR 90 MG PO TABS: 90.0000 mg | ORAL_TABLET | Freq: Two times a day (BID) | ORAL | Status: DC

## 2024-08-14 MED ORDER — DICYCLOMINE HCL 10 MG PO CAPS
10.0000 mg | ORAL_CAPSULE | Freq: Two times a day (BID) | ORAL | Status: DC | PRN
Start: 2024-08-14 — End: 2024-08-15

## 2024-08-14 MED ORDER — LABETALOL HCL 5 MG/ML IV SOLN
INTRAVENOUS | Status: DC | PRN
  Administered 2024-08-14: 10 mg via INTRAVENOUS
  Administered 2024-08-14: 15 mg via INTRAVENOUS

## 2024-08-14 MED ORDER — NITROGLYCERIN 1 MG/10 ML FOR IR/CATH LAB
INTRA_ARTERIAL | Status: AC
  Filled 2024-08-14: qty 10

## 2024-08-14 MED ORDER — NITROGLYCERIN 1 MG/10 ML FOR IR/CATH LAB
INTRA_ARTERIAL | Status: DC | PRN
  Administered 2024-08-14: 200 ug via INTRACORONARY

## 2024-08-14 MED ORDER — LABETALOL HCL 5 MG/ML IV SOLN
10.0000 mg | INTRAVENOUS | Status: AC | PRN
Start: 2024-08-14 — End: 2024-08-14
  Administered 2024-08-14 (×4): 10 mg via INTRAVENOUS
  Filled 2024-08-14 (×3): qty 4

## 2024-08-14 MED ORDER — ASPIRIN 81 MG PO CHEW
324.0000 mg | CHEWABLE_TABLET | Freq: Once | ORAL | Status: AC
  Administered 2024-08-14: 324 mg via ORAL
  Filled 2024-08-14: qty 4

## 2024-08-14 MED ORDER — TICAGRELOR 90 MG PO TABS
ORAL_TABLET | ORAL | Status: AC
  Filled 2024-08-14: qty 2

## 2024-08-14 SURGICAL SUPPLY — 18 items
BALLOON EMERGE MR 2.5X20 (BALLOONS) IMPLANT
BALLOON SAPPHIRE 1.5X20 (BALLOONS) IMPLANT
CATH GUIDELINER COAST (CATHETERS) IMPLANT
CATH INFINITI AMBI 5FR TG (CATHETERS) IMPLANT
CATH LAUNCHER 6FR JR4 (CATHETERS) IMPLANT
DEVICE RAD COMP TR BAND LRG (VASCULAR PRODUCTS) IMPLANT
ELECT DEFIB PAD ADLT CADENCE (PAD) IMPLANT
GLIDESHEATH SLEND A-KIT 6F 22G (SHEATH) IMPLANT
GUIDEWIRE INQWIRE 1.5J.035X260 (WIRE) IMPLANT
KIT ENCORE 26 ADVANTAGE (KITS) IMPLANT
KIT HEMO VALVE WATCHDOG (MISCELLANEOUS) IMPLANT
PACK CARDIAC CATHETERIZATION (CUSTOM PROCEDURE TRAY) ×1 IMPLANT
SET ATX-X65L (MISCELLANEOUS) IMPLANT
SHEATH PROBE COVER 6X72 (BAG) IMPLANT
STENT SYNERGY XD 2.50X32 (Permanent Stent) IMPLANT
STENT SYNERGY XD 2.75X12 (Permanent Stent) IMPLANT
STENT SYNERGY XD 2.75X16 (Permanent Stent) IMPLANT
WIRE RUNTHROUGH .014X180CM (WIRE) IMPLANT

## 2024-08-14 NOTE — ED Notes (Signed)
 Report given to victoria in Cath Lab. Per Dr Ladona give Labetolol 10mg  IV  and nitroglycerin  SL now. EDP aware of orders

## 2024-08-14 NOTE — Progress Notes (Signed)
 Called for TR band hematoma.  Small hematoma present proximal to TR band. 1inch x 2 inch.  2cc air added to TR band as BP was higher than when band was applied. Band at 17cc Reverse barbeau  C. Manual pressure applied, hematoma reduced.  Tr band time restarts at 18:25:00

## 2024-08-14 NOTE — Progress Notes (Signed)
 PHARMACY - ANTICOAGULATION CONSULT NOTE  Pharmacy Consult for heparin  Indication: chest pain/ACS  Allergies  Allergen Reactions   Dilaudid  [Hydromorphone ] Itching   Doxycycline Hives   Strawberry (Diagnostic) Hives    Patient Measurements: Height: 5' 5 (165.1 cm) Weight: 71.2 kg (157 lb) IBW/kg (Calculated) : 57 HEPARIN  DW (KG): 71.2  Vital Signs: Temp: 97.9 F (36.6 C) (11/25 1425) Temp Source: Oral (11/25 1425) BP: 193/103 (11/25 1445) Pulse Rate: 89 (11/25 1425)  Labs: Recent Labs    08/14/24 1424  HGB 13.7  HCT 40.8  PLT 225    CrCl cannot be calculated (Patient's most recent lab result is older than the maximum 21 days allowed.).   Medical History: Past Medical History:  Diagnosis Date   Diverticulitis    High cholesterol    Stroke [facility])     Medications:  Infusions:   sodium chloride  20 mL/hr at 08/14/24 1447   heparin       Assessment: 87 yof presented to the ED with CP. Concern for STEMI and now starting IV heparin . Baseline CBC is WNL. She is not on anticoagulation PTA.   Goal of Therapy:  Heparin  level 0.3-0.7 units/ml Monitor platelets by anticoagulation protocol: Yes   Plan:  Heparin  bolus 4000 units IV x 1 Heparin  gtt 900 units/hr F/u cath  Abbie Jablon, Vernell Helling 08/14/2024,2:54 PM

## 2024-08-14 NOTE — ED Notes (Signed)
 Called [facility] for Code Stemi @14 :40.  Spoke with Luke

## 2024-08-14 NOTE — ED Notes (Signed)
 [facility] in to transport pt to cath lab

## 2024-08-14 NOTE — ED Triage Notes (Signed)
 Pt c/o chest pain x 15-30 mins. +n/v/diaphoresis  Pt reports compliance with bp medications this AM.

## 2024-08-14 NOTE — ED Notes (Signed)
 AED pads placed on pt. Pt also on 12-lead monitor. Repeat EKG performed, given to MD Steinl. MD at bedside.

## 2024-08-14 NOTE — H&P (Addendum)
 Cardiology Admission History and Physical  Patient ID: Morgan Sparks MRN: [id number]; DOB: [birth year]   Admission date: 08/14/2024  PCP:  Zena Frederick, DO   [facility] Providers Cardiologist:  None  Electrophysiologist:  Eulas FORBES Furbish, MD    Chief Complaint:  code STEMI  Patient Profile: Morgan Sparks is a 73 y.o. 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  Upon presentation to the ED, she had inferior and lateral ST elevation suggestive of acute injury pattern.  On stabilization medically, chest pain improved, there was marked improvement in ST elevation in the inferior leads but still had chest discomfort.  In view of ACS, she was emergently transferred to [facility] for further evaluation and management.  EKG was completed showed sinus rhythm with new RBBB with mild STE in II, III, aVF and V5-V6, code STEMI was activated by the emergency department. The patient to be transferred to [facility] for cardiac catheterization.  Her current home medications include: simvastatin  40 mg nightly, hydralazine  50 mg 3 times daily, olmesartan  20 mg daily along with various inhalers for chronic underlying lung diseases.  She was previously seen by electrophysiology team for the placement of an implantable loop recorder due to recurrent strokes.  Last remote device check was from October 2025, noting normal device function, no new symptoms, tacky, bradycardia episodes, no new A-fib.   When patient was seen in the office in July 2025 she noted that she would like to terminate monthly reports due to the expense and they programmed the device for alerts only.  Patient was transferred to [facility], taken directly to our Cath Lab to undergo emergent cardiac catheterization.  Further recommendations to follow.  Upon presentation to the cardiac catheterization lab, she is completely asymptomatic without chest pain.  She did receive a total of 4 sublingual nitroglycerin .  Past Medical History:  Diagnosis Date   Diverticulitis    High cholesterol    Stroke [facility])    Past Surgical History:  Procedure Laterality Date   ABDOMINAL HYSTERECTOMY      Medications Prior to Admission: Prior to Admission medications   Medication Sig Start Date End Date Taking? Authorizing Provider  albuterol  (VENTOLIN  HFA) 108 (90 Base) MCG/ACT inhaler Inhale 2 puffs into the lungs daily as needed for wheezing or shortness of breath.  12/18/19   [provider]  BREZTRI  AEROSPHERE 160-9-4.8 MCG/ACT AERO Take 2 puffs by mouth in the morning and at bedtime. 10/29/22   [provider]  clopidogrel  (PLAVIX ) 75 MG tablet Take 1 tablet (75 mg total) by mouth daily. 01/31/23   Dennise Lavada POUR, MD  dicyclomine  (BENTYL ) 10 MG capsule Take 1 capsule (10 mg total) by mouth 2 (two) times daily as needed for spasms. 12/21/22   Caleen Qualia, MD  fluticasone  (FLONASE ) 50 MCG/ACT nasal spray Place 1-2 sprays into both nostrils daily as needed for allergies. 03/07/22   [provider]  olmesartan  (BENICAR ) 20 MG tablet Take 1 tablet (20 mg total) by mouth daily. 02/02/23   Singh, Prashant K, MD  omeprazole  (PRILOSEC) 20  MG capsule Take 1 capsule (20 mg total) by mouth daily. 12/21/22   Caleen Qualia, MD  rosuvastatin  (CRESTOR ) 20 MG tablet Take 1 tablet (20 mg total) by mouth daily. Patient not taking: Reported on 04/12/2024 01/31/23   Dennise Lavada POUR, MD  simvastatin  (ZOCOR ) 40 MG tablet  Take 40 mg by mouth. 01/26/24   [provider]  Vitamin D, Ergocalciferol, (DRISDOL) 1.25 MG (50000 UNIT) CAPS capsule Take 50,000 Units by mouth every 7 (seven) days. 03/07/24   [provider]    Allergies:    Allergies  Allergen Reactions   Dilaudid  [Hydromorphone ] Itching   Doxycycline Hives   Strawberry (Diagnostic) Hives   Social History   Tobacco Use   Smoking status: Former   Smokeless tobacco: Never  Substance Use Topics   Alcohol use: Yes    Alcohol/week: 1.0 standard drink of alcohol    Types: 1 Glasses of wine per week    Comment: occ  Marital Status: Single     Family History:   The patient's family history includes Breast cancer in her sister; Heart attack in her father; Ovarian cancer in her sister.    ROS:  Please see the history of present illness.  All other ROS reviewed and negative.     Physical Exam/Data: Vitals:   08/14/24 1500 08/14/24 1505 08/14/24 1506 08/14/24 1510  BP: (!) 207/103 (!) 199/106  (!) 167/90  Pulse: (!) 45 91 89 94  Resp: 18 (!) 21 (!) 25 (!) 21  Temp:      TempSrc:      SpO2: 100% 100% 100% 100%  Weight:      Height:       No intake or output data in the 24 hours ending 08/14/24 1550    08/14/2024    2:22 PM 04/12/2024    9:18 AM 02/21/2023    8:06 AM  Last 3 Weights  Weight (lbs) 157 lb 157 lb 160 lb 12.8 oz  Weight (kg) 71.215 kg 71.215 kg 72.938 kg     Body mass index is 26.13 kg/m.  Physical Exam Neck:     Vascular: No carotid bruit or JVD.  Cardiovascular:     Rate and Rhythm: Normal rate and regular rhythm.     Pulses: Intact distal pulses.     Heart sounds: Normal heart sounds. No murmur heard.    No gallop.  Pulmonary:     Effort: Pulmonary effort is normal.     Breath sounds: Normal breath sounds.  Abdominal:     General: Bowel sounds are normal.     Palpations: Abdomen is soft.  Musculoskeletal:     Right lower leg: No edema.     Left lower leg: No edema.     Relevant CV  Studies:  Cardiac catheterization, 08/14/2024 Pending results  Echocardiogram, 01/30/2023 Left ventricular ejection fraction, by estimation, is 60 to 65% . The left ventricle has normal function. The left ventricle has no regional wall motion abnormalities. Left ventricular diastolic parameters are consistent with Grade I diastolic dysfunction ( impaired relaxation) .  Right ventricular systolic function is normal. The right ventricular size is normal. Tricuspid regurgitation signal is inadequate for assessing PA pressure.  The mitral valve was not well visualized. No evidence of mitral valve regurgitation.  The aortic valve was not well visualized. Aortic valve regurgitation is not visualized.  Aortic not well visualized.  The inferior vena cava is normal in size with greater than 50% respiratory variability, suggesting right atrial  pressure of 3 mmHg.   Admission EKG 08/14/2024: Hyperacute T wave changes in the inferior leads and lateral leads suggestive of acute inferolateral STEMI.  Repeat EKG with improvement in chest pain: T wave inversion in the inferior leads, nonspecific.  ST segment appears to be back to baseline with minimal prominent upright T waves in the lateral leads compared to baseline EKG from.  Laboratory Data: High Sensitivity Troponin:  No results for input(s): TROPONINIHS in the last 720 hours.    Chemistry Recent Labs  Lab 08/14/24 1424  NA 142  K 3.0*  CL 101  CO2 27  GLUCOSE 89  BUN 9  CREATININE 0.88  CALCIUM  9.7  GFRNONAA >60  ANIONGAP 14    No results for input(s): PROT, ALBUMIN, AST, ALT, ALKPHOS, BILITOT in the last 168 hours. Lipids No results for input(s): CHOL, TRIG, HDL, LABVLDL, LDLCALC, CHOLHDL in the last 168 hours. Hematology Recent Labs  Lab 08/14/24 1424  WBC 3.9*  RBC 3.70*  HGB 13.7  HCT 40.8  MCV 110.3*  MCH 37.0*  MCHC 33.6  RDW 17.5*  PLT 225   Thyroid  No results for input(s): TSH, FREET4 in  the last 168 hours. BNPNo results for input(s): BNP, PROBNP in the last 168 hours.  DDimer No results for input(s): DDIMER in the last 168 hours.  Radiology/Studies:  DG Chest Port 1 View Result Date: 08/14/2024 CLINICAL DATA:  pain EXAM: PORTABLE CHEST 1 VIEW COMPARISON:  Jan 29, 2023, February 21, 2024 FINDINGS: The cardiomediastinal silhouette is unchanged in contour.Cardiac loop recorder. No pleural effusion. No pneumothorax. No acute pleuroparenchymal abnormality. IMPRESSION: No acute cardiopulmonary abnormality. Previously described pulmonary nodule of the RIGHT middle lobe is not discretely visualized radiographically. Recommend nonemergent follow-up chest CT as per CT dated February 21, 2024 recommendation Electronically Signed   By: Corean Salter M.D.   On: 08/14/2024 15:26    Assessment and Plan:  Code STEMI now patient with ACS with ST segments back to baseline with dynamic EKG changes. Presented to [facility]/14/2024 with sudden onset chest pain, nausea, diaphoresis Loaded with aspirin , started on heparin  Code STEMI was activated, she was transported to the Cath Lab Patient taken directly to Cath Lab, further recommendations to follow. I discussed with the patient regarding proceeding with cardiac catheterization.  As she is chest pain-free, I also gave her the option of continued medical therapy.  Patient wishes to proceed with cardiac catheterization which is probably appropriate.  Schedule for cardiac catheterization, and possible angioplasty. We discussed regarding risks, benefits, alternatives to this including stress testing, CTA and continued medical therapy. Patient wants to proceed. Understands <1-2% risk of death, stroke, MI, urgent CABG, bleeding, infection, renal failure but not limited to these.   Patient is full code, but does not want to be on life support for prolonged period of time.  Hypertension Home meds: Olmesartan  20 mg daily, hydralazine  50 mg  3 times daily Hypertensive while in the emergency department Will add back/adjust home meds after cath  Hyperlipidemia Home meds: Simvastatin  40 mg daily Start/continue statin pending cath report  COPD Bronchiectasis Followed closely by [facility] pulmonology, Dr. McQuaid Will add back home inhalers once confirmed  Code Status: Full Code  Critical care time 45 minutes, discussion with Dr. Franky Gaul in the ED, coordination of care, discussion with life-threatening changes with the patient and decision making.  Severity of Illness: The appropriate patient status for this patient is INPATIENT. Inpatient status is judged to be reasonable and necessary  in order to provide the required intensity of service to ensure the patient's safety. The patient's presenting symptoms, physical exam findings, and initial radiographic and laboratory data in the context of their chronic comorbidities is felt to place them at high risk for further clinical deterioration. Furthermore, it is not anticipated that the patient will be medically stable for discharge from the hospital within 2 midnights of admission.   * I certify that at the point of admission it is my clinical judgment that the patient will require inpatient hospital care spanning beyond 2 midnights from the point of admission due to high intensity of service, high risk for further deterioration and high frequency of surveillance required.*  For questions or updates, please contact [facility] Please consult [email or web address] for contact info under       Gordy Bergamo, MD, [facility] 08/14/2024, 3:50 PM [facility] [address] [place], [place] [address] Phone: [phone]. Fax:  [phone]

## 2024-08-14 NOTE — ED Notes (Signed)
 ED Provider at bedside.

## 2024-08-14 NOTE — ED Provider Notes (Signed)
 [place] [facility] Provider Note   CSN: [id number] Arrival date & time: 08/14/24  1418     Patient presents with: Chest Pain   Morgan Sparks is a 73 y.o. female.   Patient indicates was driving ~ 30 minutes ago when had acute onset mid chest/midsternal area chest pain/pressure, constant, dull, non radiating, not pleuritic. +nausea, diaphoresis, felt clammy. No sob. No hx same pain. No heartburn. No chest wall injury or strain. Occasional non prod cough. No sore throat. No fever or chills. No abd pain. No back, flank or neck pain. Father had cad/mi. Reports remote hx prior stress test. Hx cva. Takes plavix .   The history is provided by the patient, medical records and a relative.  Chest Pain Associated symptoms: nausea   Associated symptoms: no abdominal pain, no back pain, no fever, no headache, no palpitations, no shortness of breath and no vomiting        Prior to Admission medications   Medication Sig Start Date End Date Taking? Authorizing Provider  albuterol  (VENTOLIN  HFA) 108 (90 Base) MCG/ACT inhaler Inhale 2 puffs into the lungs daily as needed for wheezing or shortness of breath.  12/18/19   [provider]  BREZTRI  AEROSPHERE 160-9-4.8 MCG/ACT AERO Take 2 puffs by mouth in the morning and at bedtime. 10/29/22   [provider]  clopidogrel  (PLAVIX ) 75 MG tablet Take 1 tablet (75 mg total) by mouth daily. 01/31/23   Singh, Prashant K, MD  dicyclomine  (BENTYL ) 10 MG capsule Take 1 capsule (10 mg total) by mouth 2 (two) times daily as needed for spasms. 12/21/22   Amin, Sumayya, MD  fluticasone  (FLONASE ) 50 MCG/ACT nasal spray Place 1-2 sprays into both nostrils daily as needed for allergies. 03/07/22   [provider]  olmesartan  (BENICAR ) 20 MG tablet Take 1 tablet (20 mg total) by mouth daily. 02/02/23   Singh, Prashant K, MD  omeprazole  (PRILOSEC) 20 MG capsule Take 1 capsule (20 mg total) by mouth daily. 12/21/22    Amin, Sumayya, MD  rosuvastatin  (CRESTOR ) 20 MG tablet Take 1 tablet (20 mg total) by mouth daily. Patient not taking: Reported on 04/12/2024 01/31/23   Singh, Prashant K, MD  simvastatin  (ZOCOR ) 40 MG tablet Take 40 mg by mouth. 01/26/24   [provider]  Vitamin D, Ergocalciferol, (DRISDOL) 1.25 MG (50000 UNIT) CAPS capsule Take 50,000 Units by mouth every 7 (seven) days. 03/07/24   [provider]    Allergies: Dilaudid  [hydromorphone ], Doxycycline, and Strawberry (diagnostic)    Review of Systems  Constitutional:  Negative for chills and fever.  HENT:  Negative for sore throat.   Respiratory:  Negative for shortness of breath.   Cardiovascular:  Positive for chest pain. Negative for palpitations and leg swelling.  Gastrointestinal:  Positive for nausea. Negative for abdominal pain and vomiting.  Genitourinary:  Negative for flank pain.  Musculoskeletal:  Negative for back pain and neck pain.  Neurological:  Negative for headaches.    Updated Vital Signs BP (!) 193/103   Pulse 89   Temp 97.9 F (36.6 C) (Oral)   Resp (!) 27   Ht 1.651 m (5' 5)   Wt 71.2 kg   SpO2 92%   BMI 26.13 kg/m   Physical Exam Vitals and nursing note reviewed.  Constitutional:      Appearance: Normal appearance. She is well-developed.  HENT:     Head: Atraumatic.     Nose: Nose normal.  Mouth/Throat:     Mouth: Mucous membranes are moist.  Eyes:     General: No scleral icterus.    Conjunctiva/sclera: Conjunctivae normal.  Neck:     Trachea: No tracheal deviation.  Cardiovascular:     Rate and Rhythm: Normal rate and regular rhythm.     Pulses: Normal pulses.     Heart sounds: Normal heart sounds. No murmur heard.    No friction rub. No gallop.  Pulmonary:     Effort: Pulmonary effort is normal. No respiratory distress.     Breath sounds: Normal breath sounds.  Chest:     Chest wall: No tenderness.  Abdominal:     General: Bowel sounds are normal. There is no  distension.     Palpations: Abdomen is soft.     Tenderness: There is no abdominal tenderness.  Genitourinary:    Comments: No cva tenderness.  Musculoskeletal:        General: No swelling or tenderness.     Cervical back: Normal range of motion and neck supple. No rigidity. No muscular tenderness.     Right lower leg: No edema.     Left lower leg: No edema.  Skin:    General: Skin is warm and dry.     Findings: No rash.  Neurological:     Mental Status: She is alert.     Comments: Alert, speech normal.   Psychiatric:        Mood and Affect: Mood normal.     (all labs ordered are listed, but only abnormal results are displayed) Results for orders placed or performed during the hospital encounter of 08/14/24  CBC   Collection Time: 08/14/24  2:24 PM  Result Value Ref Range   WBC 3.9 (L) 4.0 - 10.5 K/uL   RBC 3.70 (L) 3.87 - 5.11 MIL/uL   Hemoglobin 13.7 12.0 - 15.0 g/dL   HCT 59.1 63.9 - 53.9 %   MCV 110.3 (H) 80.0 - 100.0 fL   MCH 37.0 (H) 26.0 - 34.0 pg   MCHC 33.6 30.0 - 36.0 g/dL   RDW 82.4 (H) 88.4 - 84.4 %   Platelets 225 150 - 400 K/uL   nRBC 0.0 0.0 - 0.2 %      EKG: EKG Interpretation Date/Time:  Tuesday August 14 2024 14:25:27 EST Ventricular Rate:  91 PR Interval:  148 QRS Duration:  127 QT Interval:  454 QTC Calculation: 559 R Axis:   74  Text Interpretation: Sinus rhythm Right bundle branch block mild st elev inf and v6 that does appear different as compared to ecg July 2025 - code stemi activated Confirmed by Bernard Drivers ([id number]) on 08/14/2024 2:38:51 PM  Radiology: No results found.   Procedures   Medications Ordered in the ED  0.9 %  sodium chloride  infusion ( Intravenous New Bag/Given 08/14/24 1447)  heparin  ADULT infusion 100 units/mL (25000 units/250mL) (has no administration in time range)  aspirin  chewable tablet 324 mg (324 mg Oral Given 08/14/24 1444)  heparin  injection 4,000 Units (4,000 Units Intravenous Given 08/14/24 1444)                                     Medical Decision Making Problems Addressed: Acute coronary syndrome [facility]): acute illness or injury with systemic symptoms that poses a threat to life or bodily functions ST elevation: acute illness or injury    Details: Dynamic ecg changes, changed  from prior, w slight st elev inf Unstable angina (HCC): acute illness or injury with systemic symptoms that poses a threat to life or bodily functions  Amount and/or Complexity of Data Reviewed Independent Historian:     Details: Family, hx External Data Reviewed: notes. Labs: ordered. Decision-making details documented in ED Course. Radiology: ordered and independent interpretation performed. Decision-making details documented in ED Course. ECG/medicine tests: ordered and independent interpretation performed. Decision-making details documented in ED Course. Discussion of management or test interpretation with external provider(s): cardiology  Risk OTC drugs. Prescription drug management. Decision regarding hospitalization.   Iv ns. Continuous pulse ox and cardiac monitoring. Labs ordered/sent. Imaging ordered.   Differential diagnosis includes  . Dispo decision including potential need for admission considered - will get labs and imaging and reassess.   Reviewed nursing notes and prior charts for additional history. External reports reviewed. Additional history from:  Cardiac monitor: sinus rhythm, rate 88  Code stemi activated. Asa, heparin .  Cardiology emergently consulted. Discussed pt with Dr Ladona - will take to cath lab. [facility] coming.   Ecg repeated (pt indicates pain much improved, but not resolved) - earlier ecg changes have significantly normalized - Dr Ladona aware - still plans to take emergently to cath lab.   Labs reviewed/interpreted by me - hgb 13, trop pending.   Xrays reviewed/interpreted by me - pnd.  CRITICAL CARE RE: acute coronary syndrome/unstable angina.  Performed  by: Melton Walls E Libero Puthoff Total critical care time: 40 minutes Critical care time was exclusive of separately billable procedures and treating other patients. Critical care was necessary to treat or prevent imminent or life-threatening deterioration. Critical care was time spent personally by me on the following activities: development of treatment plan with patient and/or surrogate as well as nursing, discussions with consultants, evaluation of patient's response to treatment, examination of patient, obtaining history from patient or surrogate, ordering and performing treatments and interventions, ordering and review of laboratory studies, ordering and review of radiographic studies, pulse oximetry and re-evaluation of patient's condition.      Final diagnoses:  Acute coronary syndrome (HCC)  Unstable angina (HCC)  ST elevation    ED Discharge Orders     None          Bernard Drivers, MD 08/14/24 1455

## 2024-08-14 NOTE — ED Notes (Signed)
 EDP in triage for pt.

## 2024-08-14 NOTE — Progress Notes (Signed)
 Returned for TR band hematoma. Hematoma size has not changed but is firmer. TR band repositioned using a BP cuff. Band placed 1/4 inch proximal.  Reinflated to 15cc.  Reverse barbeau C.  New TR band time 19:00:00

## 2024-08-14 NOTE — Interval H&P Note (Signed)
 History and Physical Interval Note:  08/14/2024 3:57 PM  Morgan Sparks  has presented today for surgery, with the diagnosis of stemi.  The various methods of treatment have been discussed with the patient and family. After consideration of risks, benefits and other options for treatment, the patient has consented to  Procedure(s): LEFT HEART CATH AND CORONARY ANGIOGRAPHY (N/A) and possible coronary angioplasty for unstable angina as a surgical intervention.  The patient's history has been reviewed, patient examined, no change in status, stable for surgery.  I have reviewed the patient's chart and labs.  Questions were answered to the patient's satisfaction.     Gordy Bergamo

## 2024-08-15 ENCOUNTER — Other Ambulatory Visit (HOSPITAL_COMMUNITY): Payer: Self-pay

## 2024-08-15 ENCOUNTER — Telehealth: Payer: Self-pay | Admitting: Physician Assistant

## 2024-08-15 ENCOUNTER — Inpatient Hospital Stay (HOSPITAL_COMMUNITY)

## 2024-08-15 ENCOUNTER — Telehealth (HOSPITAL_COMMUNITY): Payer: Self-pay

## 2024-08-15 DIAGNOSIS — I25110 Atherosclerotic heart disease of native coronary artery with unstable angina pectoris: Secondary | ICD-10-CM

## 2024-08-15 DIAGNOSIS — Z955 Presence of coronary angioplasty implant and graft: Secondary | ICD-10-CM | POA: Diagnosis not present

## 2024-08-15 DIAGNOSIS — I2510 Atherosclerotic heart disease of native coronary artery without angina pectoris: Secondary | ICD-10-CM | POA: Diagnosis not present

## 2024-08-15 DIAGNOSIS — I214 Non-ST elevation (NSTEMI) myocardial infarction: Secondary | ICD-10-CM | POA: Diagnosis not present

## 2024-08-15 DIAGNOSIS — I10 Essential (primary) hypertension: Secondary | ICD-10-CM | POA: Diagnosis not present

## 2024-08-15 DIAGNOSIS — E785 Hyperlipidemia, unspecified: Secondary | ICD-10-CM

## 2024-08-15 LAB — CBC
HCT: 35.5 % — ABNORMAL LOW (ref 36.0–46.0)
Hemoglobin: 11.8 g/dL — ABNORMAL LOW (ref 12.0–15.0)
MCH: 36.9 pg — ABNORMAL HIGH (ref 26.0–34.0)
MCHC: 33.2 g/dL (ref 30.0–36.0)
MCV: 110.9 fL — ABNORMAL HIGH (ref 80.0–100.0)
Platelets: 203 K/uL (ref 150–400)
RBC: 3.2 MIL/uL — ABNORMAL LOW (ref 3.87–5.11)
RDW: 17.6 % — ABNORMAL HIGH (ref 11.5–15.5)
WBC: 4.2 K/uL (ref 4.0–10.5)
nRBC: 0 % (ref 0.0–0.2)

## 2024-08-15 LAB — BASIC METABOLIC PANEL WITH GFR
Anion gap: 11 (ref 5–15)
BUN: 9 mg/dL (ref 8–23)
CO2: 28 mmol/L (ref 22–32)
Calcium: 8.8 mg/dL — ABNORMAL LOW (ref 8.9–10.3)
Chloride: 99 mmol/L (ref 98–111)
Creatinine, Ser: 1.06 mg/dL — ABNORMAL HIGH (ref 0.44–1.00)
GFR, Estimated: 56 mL/min — ABNORMAL LOW (ref >60)
Glucose, Bld: 102 mg/dL — ABNORMAL HIGH (ref 70–99)
Potassium: 3.6 mmol/L (ref 3.5–5.1)
Sodium: 138 mmol/L (ref 135–145)

## 2024-08-15 LAB — TROPONIN I (HIGH SENSITIVITY): Troponin I (High Sensitivity): 2496 ng/L (ref <18)

## 2024-08-15 LAB — ECHOCARDIOGRAM COMPLETE
Area-P 1/2: 3.42 cm2
Height: 65 in
S' Lateral: 3.3 cm
Single Plane A2C EF: 53.9 %
Weight: 2512 [oz_av]

## 2024-08-15 MED ORDER — ISOSORBIDE MONONITRATE ER 30 MG PO TB24
30.0000 mg | ORAL_TABLET | Freq: Every day | ORAL | 0 refills | Status: DC
  Filled 2024-08-15: qty 30, 30d supply, fill #0

## 2024-08-15 MED ORDER — ROSUVASTATIN CALCIUM 20 MG PO TABS
40.0000 mg | ORAL_TABLET | Freq: Every day | ORAL | Status: DC
  Administered 2024-08-15: 40 mg via ORAL
  Filled 2024-08-15: qty 2

## 2024-08-15 MED ORDER — NITROGLYCERIN 0.4 MG SL SUBL: 0.4000 mg | SUBLINGUAL_TABLET | SUBLINGUAL | 3 refills | Status: AC | PRN

## 2024-08-15 MED ORDER — POTASSIUM CHLORIDE CRYS ER 20 MEQ PO TBCR
20.0000 meq | EXTENDED_RELEASE_TABLET | Freq: Once | ORAL | Status: DC
  Filled 2024-08-15: qty 1

## 2024-08-15 MED ORDER — PANTOPRAZOLE SODIUM 40 MG PO TBEC
40.0000 mg | DELAYED_RELEASE_TABLET | Freq: Every day | ORAL | 0 refills | Status: DC
  Filled 2024-08-15: qty 30, 30d supply, fill #0

## 2024-08-15 MED ORDER — TICAGRELOR 90 MG PO TABS
90.0000 mg | ORAL_TABLET | Freq: Two times a day (BID) | ORAL | 0 refills | Status: DC
  Filled 2024-08-15: qty 60, 30d supply, fill #0

## 2024-08-15 MED ORDER — ROSUVASTATIN CALCIUM 40 MG PO TABS: 40.0000 mg | ORAL_TABLET | Freq: Every day | ORAL | 3 refills | Status: AC

## 2024-08-15 MED ORDER — ISOSORBIDE MONONITRATE ER 30 MG PO TB24: 30.0000 mg | ORAL_TABLET | Freq: Every day | ORAL | 3 refills | Status: AC

## 2024-08-15 MED ORDER — TICAGRELOR 90 MG PO TABS: 90.0000 mg | ORAL_TABLET | Freq: Two times a day (BID) | ORAL | 3 refills | Status: DC

## 2024-08-15 MED ORDER — ASPIRIN 81 MG PO TBEC: 81.0000 mg | DELAYED_RELEASE_TABLET | Freq: Every day | ORAL | 3 refills | Status: AC

## 2024-08-15 MED ORDER — METOPROLOL TARTRATE 25 MG PO TABS
25.0000 mg | ORAL_TABLET | Freq: Two times a day (BID) | ORAL | Status: DC
  Administered 2024-08-15: 25 mg via ORAL
  Filled 2024-08-15: qty 1

## 2024-08-15 MED ORDER — TICAGRELOR 90 MG PO TABS
90.0000 mg | ORAL_TABLET | Freq: Two times a day (BID) | ORAL | Status: DC
  Administered 2024-08-15: 90 mg via ORAL
  Filled 2024-08-15: qty 1

## 2024-08-15 MED ORDER — ASPIRIN 81 MG PO TBEC
81.0000 mg | DELAYED_RELEASE_TABLET | Freq: Every day | ORAL | 0 refills | Status: DC
  Filled 2024-08-15: qty 30, 30d supply, fill #0

## 2024-08-15 MED ORDER — ROSUVASTATIN CALCIUM 40 MG PO TABS
40.0000 mg | ORAL_TABLET | Freq: Every day | ORAL | 0 refills | Status: DC
  Filled 2024-08-15: qty 30, 30d supply, fill #0

## 2024-08-15 MED ORDER — POTASSIUM CHLORIDE 20 MEQ PO PACK
20.0000 meq | PACK | Freq: Once | ORAL | Status: AC
  Administered 2024-08-15: 20 meq via ORAL
  Filled 2024-08-15: qty 1

## 2024-08-15 MED ORDER — PANTOPRAZOLE SODIUM 40 MG PO TBEC: 40.0000 mg | DELAYED_RELEASE_TABLET | Freq: Every day | ORAL | 3 refills | Status: AC

## 2024-08-15 MED ORDER — METOPROLOL TARTRATE 25 MG PO TABS
25.0000 mg | ORAL_TABLET | Freq: Two times a day (BID) | ORAL | 0 refills | Status: DC
  Filled 2024-08-15: qty 60, 30d supply, fill #0

## 2024-08-15 MED ORDER — METOPROLOL TARTRATE 25 MG PO TABS: 25.0000 mg | ORAL_TABLET | Freq: Two times a day (BID) | ORAL | 3 refills | Status: AC

## 2024-08-15 MED ORDER — NITROGLYCERIN 0.4 MG SL SUBL
0.4000 mg | SUBLINGUAL_TABLET | SUBLINGUAL | 3 refills | Status: DC | PRN
  Filled 2024-08-15: qty 25, 5d supply, fill #0

## 2024-08-15 MED ORDER — ASPIRIN 81 MG PO TBEC: 81.0000 mg | DELAYED_RELEASE_TABLET | Freq: Every day | ORAL | Status: DC

## 2024-08-15 MED ORDER — CHLORHEXIDINE GLUCONATE CLOTH 2 % EX PADS
6.0000 | MEDICATED_PAD | Freq: Every day | CUTANEOUS | Status: DC
  Administered 2024-08-15: 6 via TOPICAL

## 2024-08-15 NOTE — Progress Notes (Addendum)
 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  Upon arrival to the satellite ER she had inferior ST elevations with significant chest pain, on initial stabilization, EKG did improve however with ongoing symptoms she was brought to the Cath Lab for emergent catheterization where she was found to have severe disease in the RCA as well as LAD.  The RCA was treated at the difficult procedure treating a long segment with 2 overlapping stents.  She has existing LAD disease that now is being treated medically and possible staged PCI.  Assessment & Plan  Principal Problem:   Non-ST elevation (NSTEMI) myocardial infarction [facility]) Active Problems:   Coronary artery disease involving native coronary artery of native heart with unstable angina pectoris (HCC)   Presence of drug coated stent in right coronary artery   Essential hypertension   Hyperlipidemia with target low density lipoprotein (LDL) cholesterol less than 55 mg/dL  Principal Problem:   Acute coronary syndrome (HCC)-= NON-STEMI / Coronary artery disease involving native coronary artery of native heart with unstable angina pectoris (HCC) /   Presence of drug coated stent in right coronary artery ==> Echocardiogram Pending Two-vessel CAD with PCI to the RCA which was likely culprit lesion, Plan is for staged PCI of the LAD.  Discussion with the patient that she would hope to potentially go home and be staged for outpatient PCI at a later date. => Provide her echocardiogram is stable and cardiac enzymes are not dramatically elevated, this is a reasonable idea. On ASA  81 mg/Brilinta  milligram twice daily DAPT-discussed importance of no DAPT interruption Will convert from simvastatin  to rosuvastatin  40 mg daily Continue beta-blocker and ARB Will continue Imdur  30 midodrine daily until LAD PCI. [facility] consult for ambulation and assessment.  Recommend phase 2 evaluation post LAD PCI    Essential hypertension Currently on metoprolol  tartrate 50 mg twice daily (with heart rates dropping at night, will drop dose to 25 mg twice daily) and irbesartan  150 mg daily    Hyperlipidemia with target low density lipoprotein (LDL) cholesterol less than 55 mg/dL Was on simvastatin  in the outpatient setting.  Will convert to rosuvastatin  40 mg daily.    Medical Readiness Date: 08/15/2024 depending on echocardiogram and troponin results.  Troponin ~ 2400 & Echo pending  ------------------------------------------------------------------------------------------------------------------------------------------- Interval Summary   Feels well today.  No further chest pain since her PCI.  No dyspnea.  Has walked around the room but not yet walking the hallway.  Vital Signs Vitals:   08/15/24 0700 08/15/24 0800 08/15/24 0900 08/15/24 1000  BP: 116/81 (!) 110/58 136/68 (!) 153/85  Pulse: 67 69 66 67  Resp: 18 19 (!) 9 15  Temp:      TempSrc:      SpO2: 93% 96% 93% 93%  Weight:      Height:        Intake/Output Summary (Last 24 hours) at 08/15/2024 1056 Last data filed at 08/15/2024 0700 Gross per 24 hour  Intake 1250 ml  Output 15 ml  Net 1235 ml      08/14/2024    2:22 PM 04/12/2024    9:18 AM 02/21/2023    8:06 AM  Last 3 Weights  Weight (lbs) 157 lb 157  lb 160 lb 12.8 oz  Weight (kg) 71.215 kg 71.215 kg 72.938 kg     No results found for: CKTOTAL, CKMB, CKMBINDEX, TROPONINI  Lab Results  Component Value Date   CHOL 216 (H) 08/14/2024   HDL 115 08/14/2024   LDLCALC 83 08/14/2024   TRIG 90 08/14/2024   CHOLHDL 1.9 08/14/2024     Telemetry/ECG   Sinus rhythm with occasional PVCs- Personally Reviewed EKG shows sinus rhythm with RBBB rate 69 bpm.  (Initial inferior ST elevations no longer present)  Cardiac Studies Cardiac Cath-PCI 08/14/2024: Culprit lesion segment proximal to mid RCA 80%, 90% and 80% stenoses (DES PCI with 2 overlapping Synergy XD 2.5 mm 32 mm and 2.5 mm 16 mm); moderate size LAD with proximal 30% involving 95% D1 followed by focal 90% stenosis prior to D2.  Moderate disease throughout.  Nondominant LCx minimal disease with small OM branches. => Plan staged PCI of the LAD and medical management of D1 potentially as outpatient. Stenting of the proximal and mid RCA with implantation of 2 overlapping 2.5 x 32 and 2.5 x 16 mm Synergy XD DES deployed at 16 atmospheric pressure and postdilated with stent balloons at 20 atmospheric pressure.  Stenosis reduced from 90% to 0% with TIMI-3 to TIMI-3 flow.        Prior Echo 01/30/2023: EF 60 to 65%.  No RWMA.  GR 1 DD.  Normal valves.  Normal RAP.  Physical Exam  GEN: No acute distress.   Neck: No JVD Cardiac: RRR, no murmurs, rubs, or gallops.  Respiratory: Clear to auscultation bilaterally. GI: Soft, nontender, non-distended  MS: No edema; right wrist with mild ecchymosis but no hematoma.  Mild tenderness.  Positive Allen's  For questions or updates, please contact [facility] Please consult [email or web address] for contact info under           Signed, Alm Clay, MD

## 2024-08-15 NOTE — Progress Notes (Signed)
  Echocardiogram 2D Echocardiogram has been performed.  Koleen KANDICE Popper, RDCS 08/15/2024, 9:53 AM

## 2024-08-15 NOTE — Discharge Summary (Addendum)
 Discharge Summary   Patient ID: Morgan Sparks MRN: [id number]; DOB: [birth year]  Admit date: 08/14/2024 Discharge date: 08/15/2024  PCP:  Zena Frederick, DO   [facility] Providers Cardiologist:  Gordy Bergamo, MD  Electrophysiologist:  Eulas FORBES Furbish, MD      Discharge Diagnoses  Principal Problem:   Non-ST elevation (NSTEMI) myocardial infarction [facility]) Active Problems:   Hyperlipidemia with target low density lipoprotein (LDL) cholesterol less than 55 mg/dL   Essential hypertension   Coronary artery disease involving native coronary artery of native heart with unstable angina pectoris (HCC)   Presence of drug coated stent in right coronary artery   Diagnostic Studies/Procedures   2D echo 08/15/24:  1. Left ventricular ejection fraction, by estimation, is 50 to 55%. The  left ventricle has low normal function. Left ventricular endocardial  border not optimally defined to evaluate regional wall motion. Left  ventricular diastolic parameters are  consistent with Grade I diastolic dysfunction (impaired relaxation).   2. Right ventricular systolic function is mildly reduced. The right  ventricular size is normal. There is normal pulmonary artery systolic  pressure. The estimated right ventricular systolic pressure is 29.3 mmHg.   3. The mitral valve is normal in structure. Trivial mitral valve  regurgitation. No evidence of mitral stenosis.   4. The aortic valve was not well visualized. Aortic valve regurgitation  is trivial. No aortic stenosis is present.   5. The inferior vena cava is dilated in size with >50% respiratory  variability, suggesting right atrial pressure of 8 mmHg.  Cardiac Catheterization 08/14/24: Hemodynamic data: LVEDP 17 mmHg.  There is no pressure gradient across the aortic valve.   Angiographic data: LM: Mildly calcified, short. LAD: It is a moderate-sized vessel with moderate diffuse disease, moderate amount of diffuse coronary calcifications  noted throughout the proximal and mid segment.  Gives origin to a very large septal perforator that traverses all the way towards the apical septum.  Large D1 with a ostial 95% focal calcific stenosis and a moderate-sized D2.  Between D1 and D2 there is calcific focal 90% stenosis.  Apical LAD is moderate to severely diffusely diseased. LCx: It is a moderate caliber vessel giving origin to small marginals 1 through 4.  Mild disease is evident. RCA: A moderate caliber vessel with moderate amount of coronary calcification noted in the proximal and mid segment.  Gives origin to a large RV branch and a large proximal PDA followed by a moderate-sized PDA distally and small sized PL branch.  There are tandem proximal 80%, mid 90% and another 80% calcific stenosis.   Intervention data: Successful but complex coronary intervention with a high-grade stenosis, unable to pass a 1.5 mm balloon initially, with help of guide liner successful intervention was performed.  Stenting of the proximal and mid RCA with implantation of 2 overlapping 2.5 x 32 and 2.5 x 16 mm Synergy XD DES deployed at 16 atmospheric pressure and postdilated with stent balloons at 20 atmospheric pressure.  Stenosis reduced from 90% to 0% with TIMI-3 to TIMI-3 flow.   Impression and recommendations: Patient has high-grade stenosis in the midsegment of the LAD, has diffuse disease in the LAD and a moderate-sized D1 with a ostial 90% stenosis.  Would recommend medical therapy for D1 disease and focal stenting of the LAD at a later date in the outpatient basis.   Patient will need aggressive risk modification, will need aspirin  + Brilinta  for at least 1 year in view of ACS.   _____________  History of Present Illness   Morgan Sparks is a 73 y.o. 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 She was driving 88/74/74 and suddenly had acute onset of midsternal chest pain associated with diaphoresis and nausea so presented to [facility]. Upon arrival to the ED, she had inferior and lateral ST elevation suggestive of acute injury pattern.  On stabilization medically, chest pain improved, there was marked improvement in ST elevation in the inferior leads but still had chest discomfort. She received several SL NTG. She was emergently transferred to [facility] cath lab for further evaluation and management.   Hospital Course    1. NSTEMI/CAD - underwent catheterization as above with successful but complex coronary intervention with PTCA/2 overlapping stents of the proximal and mid RCA, with residual disease in the LAD -> plan for outpatient follow-up in the office to set up staged PCI of the LAD - hsTroponin 2,496 which Dr. Anner felt was c/w presentation - 2D echo showed EF 50-55%, G1DD, trivial MR, dilated IVC - prior-to-admission Plavix  changed to Brilinta  90mg  BID - simvastatin  changed to rosuvastatin  40mg  daily - started on isosorbide ; Dr. Anner recommends to continue until LAD PCI - he anticipates plan to start cardiac rehab phase 2 after the staged PCI is completed  2. Essential hypertension - briefly on formulary irbesartan , to return to olmesartan  20mg  daily at discharge - will go home on new metoprolol  25mg  BID (dose reduced from 50mg  inpatient due to HR dropping at night)  3.  Hyperlipidemia with target low density lipoprotein (LDL) cholesterol less than 55 mg/dL - lipid panel this admission with LDL 83, trig 90, HDL 115; LP(a) in process - simvastatin  transitioned to rosuvastation - if the patient is tolerating statin at time of follow-up appointment, would consider rechecking liver  function/lipid panel in 6-8 weeks  4. Hypokalemia on admission - repleted, with f/u value normal today - can repeat BMET as outpatient in preparation for staged catheterization, potassium historically OK  5. Mild macrocytic anemia - decline in Hgb felt related to procedure/hydration, no bleeding reported - can repeat CBC as outpatient in preparation for staged catheterization  6. 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 Pulmonary nodule - CXR mentioned Previously described pulmonary nodule of the RIGHT middle lobe is not discretely visualized radiographically. Recommend nonemergent follow-up chest CT as per CT dated February 21, 2024 recommendation  - patient had CT at [facility] 06/2024 following lung nodules, will defer timing of next follow-up to the team managing these for her - discussed with patient, she follows with Dr. McQuaid (saw earlier this month)  Dr. Anner has seen and examined the patient today and feels she is stable for discharge. First month fills sent to [facility] pharmacy - Luke Sour, pharmacist, plans to redirect refills to patient's mail order once filled by [facility] team.     Did the patient have an acute coronary syndrome (MI, NSTEMI, STEMI, etc) this admission?:  Yes                               AHA/ACC ACS Clinical Performance & Quality Measures:  Aspirin  prescribed? - Yes ADP Receptor Inhibitor (Plavix /Clopidogrel , Brilinta /Ticagrelor  or Effient/Prasugrel) prescribed (includes medically managed patients)? - Yes Beta Blocker prescribed? - Yes High Intensity Statin (Lipitor 40-80mg  or Crestor  20-40mg ) prescribed? - Yes EF assessed during THIS hospitalization? - Yes For EF <40%, was ACEI/ARB prescribed? - Not Applicable (EF >/= 40%) For EF <40%, Aldosterone Antagonist (Spironolactone or Eplerenone) prescribed? - Not Applicable (EF >/= 40%) Cardiac Rehab Phase II ordered (including medically managed patients)? - Yes        The patient will be scheduled for a TOC follow up appointment on 12/5. A message has been sent to the [facility] and Scheduling Pool at the office where the patient should be seen for follow up.  _____________  Discharge Vitals Blood pressure 133/70, pulse 73, temperature (!) 97.1 F (36.2 C), temperature source Axillary, resp. rate (!) 23, height 5' 5 (1.651 m), weight 71.2 kg, SpO2 97%.  Filed Weights   08/14/24 1422  Weight: 71.2 kg    Labs & Radiologic Studies  CBC Recent Labs    08/14/24 1424 08/15/24 0302  WBC 3.9* 4.2  HGB 13.7 11.8*  HCT 40.8 35.5*  MCV 110.3* 110.9*  PLT 225 203   Basic Metabolic Panel Recent Labs    88/74/74 1424 08/15/24 0302  NA 142 138  K 3.0* 3.6  CL 101 99  CO2 27 28  GLUCOSE 89 102*  BUN 9 9  CREATININE 0.88 1.06*  CALCIUM  9.7 8.8*   High Sensitivity Troponin:   Recent Labs  Lab 08/15/24 0811  TROPONINIHS 2,496*    Recent Labs  Lab 08/14/24 1424  TRNPT <15    Hemoglobin A1C Recent Labs    08/14/24 1424  HGBA1C 5.7*   Fasting Lipid Panel Recent Labs    08/14/24 1433  CHOL 216*  HDL 115  LDLCALC 83  TRIG 90  CHOLHDL 1.9   No results found for: LIPOA  Thyroid  Function Tests No results for input(s): TSH, T4TOTAL, T3FREE, THYROIDAB in the last 72 hours.  Invalid input(s): FREET3 _____________  ECHOCARDIOGRAM COMPLETE Result Date: 08/15/2024    ECHOCARDIOGRAM REPORT   Patient Name:   Morgan Sparks Date of Exam: 08/15/2024 Medical Rec #:  [id number]     Height:       65.0 in Accession #:    [id number]    Weight:       157.0 lb Date of Birth:  Nov 30, 1950     BSA:          1.785 m Patient Age:    72 years      BP:           136/68 mmHg Patient Gender: F             HR:           69 bpm. Exam Location:  Inpatient Procedure: 2D Echo, Cardiac Doppler and Color Doppler (Both Spectral and Color            Flow Doppler were utilized during procedure). Indications:    CAD native vessel I25.10  History:         Patient has prior history of Echocardiogram examinations, most                 recent 01/30/2023. Previous Myocardial Infarction and CAD,                 Stroke; Risk Factors:Hypertension and Dyslipidemia.  Sonographer:    Koleen Popper RDCS Referring Phys: [phone] Jarrid Lienhard W Correen Bubolz  IMPRESSIONS  1. Left ventricular ejection fraction, by estimation, is 50 to 55%. The left ventricle has low normal function. Left ventricular endocardial border not optimally defined to evaluate regional wall motion. Left ventricular diastolic parameters are consistent with Grade I diastolic dysfunction (impaired relaxation).  2. Right ventricular systolic function is mildly reduced. The right ventricular size is normal. There is normal pulmonary artery systolic pressure. The estimated right ventricular systolic pressure is 29.3 mmHg.  3. The mitral valve is normal in structure. Trivial mitral valve regurgitation. No evidence of mitral stenosis.  4. The aortic valve was not well visualized. Aortic valve regurgitation is trivial. No aortic stenosis is present.  5. The inferior vena cava is dilated in size with >50% respiratory variability, suggesting right atrial pressure of 8 mmHg. FINDINGS  Left Ventricle: Left ventricular ejection fraction, by estimation, is 50 to 55%. The left ventricle has low normal function. Left ventricular endocardial border not optimally defined to evaluate regional wall motion. The left ventricular internal cavity  size was normal in size. There is no left ventricular hypertrophy. Left ventricular diastolic parameters are consistent with Grade I diastolic dysfunction (impaired relaxation). Right Ventricle: The right ventricular size is normal. No increase in right ventricular wall thickness. Right ventricular systolic function is mildly reduced. There is normal pulmonary artery systolic pressure. The tricuspid regurgitant velocity is 2.31 m/s, and with an assumed right atrial pressure of 8 mmHg, the estimated right  ventricular systolic pressure is 29.3 mmHg. Left [facility]: Left atrial size was normal in size. Right [facility]: Right atrial size was normal in size. Pericardium: There is no evidence of pericardial effusion. Mitral Valve: The mitral valve is normal in structure. Trivial mitral valve regurgitation. No evidence of mitral valve stenosis. Tricuspid Valve: The tricuspid valve is normal in structure. Tricuspid valve regurgitation is trivial. Aortic Valve: The aortic valve was not well visualized. Aortic valve regurgitation is trivial. No aortic stenosis is present. Pulmonic Valve: The pulmonic valve was not well visualized. Pulmonic valve regurgitation is not visualized. Aorta: The aortic root is normal in size and structure. Venous: The inferior vena cava is dilated in size with greater than 50% respiratory variability, suggesting right atrial pressure of 8 mmHg. IAS/Shunts: The interatrial septum was not well visualized.  LEFT VENTRICLE PLAX 2D LVIDd:         4.40 cm     Diastology LVIDs:         3.30 cm     LV e' medial:    4.03 cm/s LV PW:         1.00 cm     LV E/e' medial:  14.2 LV IVS:        0.90 cm     LV e' lateral:   4.68 cm/s LVOT diam:     1.70 cm     LV E/e' lateral: 12.3 LV SV:         46 LV SV Index:   26 LVOT Area:     2.27 cm  LV Volumes (MOD) LV vol d, MOD A2C: 80.3 ml LV vol s, MOD A2C: 37.0 ml LV SV MOD A2C:     43.3 ml RIGHT VENTRICLE            IVC RV Basal diam:  4.00 cm    IVC diam: 2.40 cm RV S prime:     7.94 cm/s TAPSE (M-mode): 1.9 cm LEFT ATRIUM           Index  RIGHT ATRIUM           Index LA diam:      2.60 cm 1.46 cm/m   RA Area:     15.70 cm LA Vol (A4C): 21.9 ml 12.27 ml/m  RA Volume:   41.20 ml  23.08 ml/m  AORTIC VALVE LVOT Vmax:   94.30 cm/s LVOT Vmean:  64.700 cm/s LVOT VTI:    0.202 m  AORTA Ao Root diam: 2.90 cm MITRAL VALVE               TRICUSPID VALVE MV Area (PHT): 3.42 cm    TR Peak grad:   21.3 mmHg MV Decel Time: 222 msec    TR Vmax:        231.00 cm/s MV E  velocity: 57.40 cm/s MV A velocity: 99.00 cm/s  SHUNTS MV E/A ratio:  0.58        Systemic VTI:  0.20 m                            Systemic Diam: 1.70 cm Lonni Nanas MD Electronically signed by Lonni Nanas MD Signature Date/Time: 08/15/2024/1:39:50 PM    Final    CARDIAC CATHETERIZATION Result Date: 08/14/2024 Images from the original result were not included. Cardiac Catheterization 08/14/24: Hemodynamic data: LVEDP 17 mmHg.  There is no pressure gradient across the aortic valve. Angiographic data: LM: Mildly calcified, short. LAD: It is a moderate-sized vessel with moderate diffuse disease, moderate amount of diffuse coronary calcifications noted throughout the proximal and mid segment.  Gives origin to a very large septal perforator that traverses all the way towards the apical septum.  Large D1 with a ostial 95% focal calcific stenosis and a moderate-sized D2.  Between D1 and D2 there is calcific focal 90% stenosis.  Apical LAD is moderate to severely diffusely diseased. LCx: It is a moderate caliber vessel giving origin to small marginals 1 through 4.  Mild disease is evident. RCA: A moderate caliber vessel with moderate amount of coronary calcification noted in the proximal and mid segment.  Gives origin to a large RV branch and a large proximal PDA followed by a moderate-sized PDA distally and small sized PL branch.  There are tandem proximal 80%, mid 90% and another 80% calcific stenosis. Intervention data: Successful but complex coronary intervention with a high-grade stenosis, unable to pass a 1.5 mm balloon initially, with help of guide liner successful intervention was performed.  Stenting of the proximal and mid RCA with implantation of 2 overlapping 2.5 x 32 and 2.5 x 16 mm Synergy XD DES deployed at 16 atmospheric pressure and postdilated with stent balloons at 20 atmospheric pressure.  Stenosis reduced from 90% to 0% with TIMI-3 to TIMI-3 flow.     Impression and  recommendations: Patient has high-grade stenosis in the midsegment of the LAD, has diffuse disease in the LAD and a moderate-sized D1 with a ostial 90% stenosis.  Would recommend medical therapy for D1 disease and focal stenting of the LAD at a later date in the outpatient basis. Patient will need aggressive risk modification, will need aspirin  + Brilinta  for at least 1 year in view of ACS.   DG Chest Port 1 View Result Date: 08/14/2024 CLINICAL DATA:  pain EXAM: PORTABLE CHEST 1 VIEW COMPARISON:  Jan 29, 2023, February 21, 2024 FINDINGS: The cardiomediastinal silhouette is unchanged in contour.Cardiac loop recorder. No pleural effusion. No pneumothorax. No acute pleuroparenchymal abnormality. IMPRESSION: No  acute cardiopulmonary abnormality. Previously described pulmonary nodule of the RIGHT middle lobe is not discretely visualized radiographically. Recommend nonemergent follow-up chest CT as per CT dated February 21, 2024 recommendation Electronically Signed   By: Corean Salter M.D.   On: 08/14/2024 15:26    Disposition Pt is being discharged home today in good condition.  Follow-up Plans & Appointments  Follow-up Information     Emelia Josefa HERO, NP Follow up.   Specialty: Cardiology Why: Morgan Sparks - cardiology follow-up arranged on Thursday Aug 23, 2024 10:55 AM (Arrive by 10:35 AM). Josefa is one of our nurse practitioners that works with Dr. Ladona. Contact information: [address] [place] [address] [phone]                Discharge Instructions     Amb Referral to Cardiac Rehabilitation   Complete by: As directed    To [place]   Diagnosis:  Coronary Stents PTCA NSTEMI     After initial evaluation and assessments completed: Virtual Based Care may be provided alone or in conjunction with Phase 2 Cardiac Rehab based on patient barriers.: Yes   Intensive Cardiac Rehabilitation (ICR) [facility] location only OR Traditional Cardiac Rehabilitation (TCR) *If criteria  for ICR are not met will enroll in TCR [facility] only): Yes   Diet - low sodium heart healthy   Complete by: As directed    Discharge instructions   Complete by: As directed    Please review your medication list for the new medicine changes that were made.  Of note, dicyclomine  was removed from your medicine list since you indicated you no longer take these.   Increase activity slowly   Complete by: As directed    No driving for 1 week. No lifting over 10 lbs for 2 weeks. No sexual activity for 2 weeks. Keep procedure site clean & dry. If you notice increased pain, swelling, bleeding or pus, call/return!  You may shower, but no soaking baths/hot tubs/pools for 1 week.       Discharge Medications Allergies as of 08/15/2024       Reactions   Dilaudid  [hydromorphone ] Itching   Pt not aware   Doxycycline Hives   Strawberry (diagnostic) Hives        Medication List     STOP taking these medications    clopidogrel  75 MG tablet Commonly known as: PLAVIX    dicyclomine  10 MG capsule Commonly known as: BENTYL    omeprazole  20 MG capsule Commonly known as: PRILOSEC Replaced by: pantoprazole  40 MG tablet   simvastatin  40 MG tablet Commonly known as: ZOCOR        TAKE these medications    albuterol  108 (90 Base) MCG/ACT inhaler Commonly known as: VENTOLIN  HFA Inhale 2 puffs into the lungs daily as needed for wheezing or shortness of breath.   aspirin  EC 81 MG tablet Take 1 tablet (81 mg total) by mouth daily. Swallow whole. Start taking on: August 16, 2024   cyclobenzaprine 10 MG tablet Commonly known as: FLEXERIL Take 10 mg by mouth 2 (two) times daily as needed for muscle spasms.   famotidine 20 MG tablet Commonly known as: PEPCID Take 20 mg by mouth daily as needed for heartburn or indigestion.   fluticasone  50 MCG/ACT nasal spray Commonly known as: FLONASE  Place 1-2 sprays into both nostrils daily as needed for allergies.   ipratropium-albuterol  0.5-2.5 (3)  MG/3ML Soln Commonly known as: DUONEB Inhale 3 mLs into the lungs every 6 (six) hours as needed (wheezing /  shortness of breath).   isosorbide  mononitrate 30 MG 24 hr tablet Commonly known as: IMDUR  Take 1 tablet (30 mg total) by mouth daily. Start taking on: August 16, 2024   metoprolol  tartrate 25 MG tablet Commonly known as: LOPRESSOR  Take 1 tablet (25 mg total) by mouth 2 (two) times daily.   nitroGLYCERIN  0.4 MG SL tablet Commonly known as: Nitrostat  Place 1 tablet (0.4 mg total) under the tongue every 5 (five) minutes as needed for chest pain (up to 3 doses).   olmesartan  20 MG tablet Commonly known as: BENICAR  Take 1 tablet (20 mg total) by mouth daily.   pantoprazole  40 MG tablet Commonly known as: PROTONIX  Take 1 tablet (40 mg total) by mouth daily. Start taking on: August 16, 2024 Replaces: omeprazole  20 MG capsule   rosuvastatin  40 MG tablet Commonly known as: CRESTOR  Take 1 tablet (40 mg total) by mouth daily. What changed:  medication strength how much to take   ticagrelor  90 MG Tabs tablet Commonly known as: BRILINTA  Take 1 tablet (90 mg total) by mouth 2 (two) times daily.   torsemide 20 MG tablet Commonly known as: DEMADEX Take 20 mg by mouth daily as needed (edema).   Trelegy Ellipta 100-62.5-25 MCG/ACT Aepb Generic drug: Fluticasone -Umeclidin-Vilant Inhale 1 puff into the lungs daily at 12 noon.   Vitamin D (Ergocalciferol) 1.25 MG (50000 UNIT) Caps capsule Commonly known as: DRISDOL Take 50,000 Units by mouth every 7 (seven) days.         Outstanding Labs/Studies N/a  Duration of Discharge Encounter: APP Time: 15 minutes   Signed, Dayna N Dunn, PA-C 08/15/2024, 2:55 PM  ATTENDING ATTESTATION  I have seen, examined and evaluated the patient this AM on rounds & re-visited this PM after reviewing all the available data and chart, we discussed the patients laboratory, study & physical findings as well as symptoms in detail.  I  discussed the general summary of the patient's hospitalization along with pertinent findings, impressions and recommendations with Dayna Dunn, PA-C.  I agree with her findings, examination, impression recommendations and hospital summary as noted above.  See final Progress NOTE for details.  I spent 40 minutes in the care of Morgan Sparks today including reviewing labs (2 min), reviewing studies (reviewed Cath films & Echo images confirming report findings & discussed with patient = 12 min), face to face time discussing treatment options (15 min), reviewing records from OP EP note (2 min), 9 min dictating, and documenting in the encounter.    Alm MICAEL Clay, MD, MS Alm Clay, MD., M.S. Interventional Cardiologist  [facility]  Pager # [phone] Phone # [phone] [address] [place], [place] [address]

## 2024-08-15 NOTE — Telephone Encounter (Signed)
 Patient contacted by clinical team regarding discharge from hospital on 08/15/2024.  Patient understands to follow up with provider Cleaver on 08/23/2024 at 10:55 am at [facility] office. Patient understands discharge instructions? Yes Patient understands medications and how to take them? Yes Patient understands to bring all medications to this visit?Yes  You can review AVS Prior to calling to determine if patient had orders for Home Health or Medical Equipment (DME). Patient reports appropriate help at home with ADL's and has DME at this time. None ordered Patient reports they have been contacted by home health if ordered.No   You can see if the patient is active in MyChart next to their picture.  If they are please remind them that they can do early check in for their visit a day or two before to make the day of their visit run as smooth as possible.  If they need help with MyChart you can refer them to their AVS for user name and password and give them the phone number [phone] to call for help.     Postop: Structural/Cath/Device/(other cardiac invasive procedures) patients:   What is your wound status? Any signs/ symptoms of infection (Temp, redness/ red streaks, swelling, purulent drainage, foul odor or smell)? None   Please do not place any creams/ lotions/ or antibiotic ointment on any surgical incisions/ wounds without physician approval.

## 2024-08-15 NOTE — Telephone Encounter (Signed)
   Transition of Care Follow-up Phone Call Request    Patient Name: Morgan Sparks Date of Birth: 06-12-1951 Date of Encounter: 08/15/2024  Primary Care Provider:  Zena Frederick, DO Primary Cardiologist:  Gordy Bergamo, MD  Morgan Sparks has been scheduled for a transition of care follow up appointment with a [facility] provider:  Josefa Beauvais NP 08/23/24  Please reach out to Morgan Sparks within 48 hours of discharge to confirm appointment and review transition of care protocol questionnaire. Anticipated discharge date: today  Tahisha Hakim N Jacie Tristan, PA-C  08/15/2024, 2:28 PM

## 2024-08-15 NOTE — Progress Notes (Signed)
 CARDIAC REHAB PHASE I   PRE:  Rate/Rhythm: 69 SR    BP: sitting 122/70    SpO2: 97 RA  MODE:  Ambulation: 410 ft   POST:  Rate/Rhythm: 82 SR    BP: sitting 133/70     SpO2: 92 RA   Pt tolerated well, denied sx. SpO2 lower after walk but she sts she hasn't had her inhaler.  Discussed with pt and her boyfriend MI, stents, restrictions, Brilinta  importance, diet, exercise, NTG, and CRPII. Pt receptive. Will refer to [facility]. We discussed light ambulation only before her staged PCI. [phone]  Morgan Sparks BS, ACSM-CEP 08/15/2024 1:41 PM

## 2024-08-15 NOTE — TOC CM/SW Note (Signed)
 Transition of Care [facility]) - Inpatient Brief Assessment   Patient Details  Name: Morgan Sparks MRN: [id number] Date of Birth: [birth year]  Transition of Care [facility]) CM/SW Contact:    Sudie Erminio Deems, RN Phone Number: 08/15/2024, 1:39 PM   Clinical Narrative: Patient presented for chest pain-LHC. PTA patient was from home alone. Patient has support of friend Waylan that assists with transportation to appointments. Patient has Insurance and PCP. Waylan states patient has oxygen in the home; however, she does not use it. Benefits check submitted for Brilinta -$4.90. No home needs identified at this time.    Transition of Care Asessment: Insurance and Status: Insurance coverage has been reviewed Patient has primary care physician: Yes Home environment has been reviewed: reviewed Prior level of function:: independent Prior/Current Home Services: No current home services Social Drivers of Health Review: SDOH reviewed no interventions necessary Readmission risk has been reviewed: Yes Transition of care needs: no transition of care needs at this time

## 2024-08-15 NOTE — Telephone Encounter (Signed)
 Pharmacy Patient Advocate Encounter  Insurance verification completed.    The patient is insured through [facility]. Patient has Medicare and is not eligible for a copay card, but may be able to apply for patient assistance or Medicare RX Payment Plan (Patient Must reach out to their plan, if eligible for payment plan), if available.    Ran test claim for Generic Brilinta  90mg  and the current 30 day co-pay is $4.90.   This test claim was processed through [facility]- copay amounts may vary at other pharmacies due to pharmacy/plan contracts, or as the patient moves through the different stages of their insurance plan.

## 2024-08-15 NOTE — Plan of Care (Signed)
  Problem: Education: Goal: Knowledge of General Education information will improve Description: Including pain rating scale, medication(s)/side effects and non-pharmacologic comfort measures Outcome: Progressing   Problem: Clinical Measurements: Goal: Ability to maintain clinical measurements within normal limits will improve Outcome: Progressing Goal: Will remain free from infection Outcome: Progressing Goal: Diagnostic test results will improve Outcome: Progressing Goal: Respiratory complications will improve Outcome: Progressing Goal: Cardiovascular complication will be avoided Outcome: Progressing   Problem: Nutrition: Goal: Adequate nutrition will be maintained Outcome: Progressing   Problem: Pain Managment: Goal: General experience of comfort will improve and/or be controlled Outcome: Progressing   Problem: Cardiovascular: Goal: Ability to achieve and maintain adequate cardiovascular perfusion will improve Outcome: Progressing

## 2024-08-15 NOTE — Discharge Instructions (Signed)

## 2024-08-17 LAB — LIPOPROTEIN A (LPA): Lipoprotein (a): 228.9 nmol/L — ABNORMAL HIGH (ref <75.0)

## 2024-08-20 ENCOUNTER — Telehealth (HOSPITAL_COMMUNITY): Payer: Self-pay

## 2024-08-20 NOTE — Telephone Encounter (Signed)
 Per phase I cardiac rehab, faxed referral to [facility]

## 2024-08-21 NOTE — Progress Notes (Unsigned)
 Cardiology Clinic Note   Patient Name: Morgan Sparks Date of Encounter: 08/23/2024  Primary Care Provider:  Patient, No Pcp Per Primary Cardiologist:  Gordy Bergamo, MD  Patient Profile    Morgan Sparks 73 year old female presents the clinic today for follow-up evaluation of her coronary artery disease status post NSTEMI.  Past Medical History    Past Medical History:  Diagnosis Date   Diverticulitis    High cholesterol    Stroke [facility])    Past Surgical History:  Procedure Laterality Date   ABDOMINAL HYSTERECTOMY     CORONARY STENT INTERVENTION N/A 08/14/2024   Procedure: CORONARY STENT INTERVENTION;  Surgeon: Bergamo Gordy, MD;  Location: [facility] INVASIVE CV LAB;  Service: Cardiovascular;  Laterality: N/A;   LEFT HEART CATH AND CORONARY ANGIOGRAPHY N/A 08/14/2024   Procedure: LEFT HEART CATH AND CORONARY ANGIOGRAPHY;  Surgeon: Bergamo Gordy, MD;  Location: [facility] INVASIVE CV LAB;  Service: Cardiovascular;  Laterality: N/A;    Allergies  Allergies  Allergen Reactions   Dilaudid  [Hydromorphone ] Itching    Pt not aware    Doxycycline Hives   Strawberry (Diagnostic) Hives    History of Present Illness    Morgan Sparks has a PMH of NSTEMI, hyperlipidemia, coronary artery disease, essential hypertension and grade 1 diastolic dysfunction on echocardiogram 08/15/2024.  Her LVEF was noted to be 50-55%.  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  She had a loop recorder implanted.  She was driving 88/74/7974.  She developed sudden acute onset of midsternal chest pain.  She had associated diaphoresis and nausea symptoms.  She presented to [facility] emergency department.  Upon arrival to the emergency department she had inferior and lateral ST elevation suggesting acute injury pattern.  She received medical therapy and her chest pain improved.  She was noted to have marked improvement in her ST  elevation in the inferior leads but still noted chest discomfort.  She received sublingual nitroglycerin .  She was emergently transferred to [facility] for further evaluation and management.  She underwent LHC and was noted to have mild calcification in her left main and moderate diffuse coronary calcifications throughout her proximal and mid LAD.  Circumflex was noted to have mild disease.  Her RCA was noted to have 80% proximal, mid 90%, and another calcified segment 80%.  She received PCI with DES to her proximal and mid RCA with 2 overlapping stents.  It was recommended that she have medical therapy for D1 disease and focal stenting of the LAD at a later date (staged PCI).  She was placed on aspirin  and Brilinta  to be continued for 1 year.  She presents to the clinic today for follow-up evaluation and states she has been feeling well at home. She has been walking around her house during the day. She denies any symptoms with this. She did stop taking her Olmesartan  and Torsemide for 2 days. Her blood pressure went up, so she contacted the office and was told to continue taking her blood pressure medication. She started taking Torsemide 10 mg every other day and has been concerned about her kidneys.  She denies chest pain, shortness of breath, lower extremity edema, fatigue, palpitations, melena, hematuria, hemoptysis, diaphoresis, weakness, presyncope, syncope, orthopnea, and PND.  Informed Consent   Shared Decision Making/Informed Consent The risks [stroke (1 in 1000), death (1 in 1000), kidney failure [usually temporary] (1 in 500), bleeding (1  in 200), allergic reaction [possibly serious] (1 in 200)], benefits (diagnostic support and management of coronary artery disease) and alternatives of a cardiac catheterization were discussed in detail with Ms. Longsworth and she is willing to proceed.     Home Medications    Prior to Admission medications   Medication Sig Start Date End Date Taking?  Authorizing Provider  albuterol  (VENTOLIN  HFA) 108 (90 Base) MCG/ACT inhaler Inhale 2 puffs into the lungs daily as needed for wheezing or shortness of breath.  12/18/19   [provider]  aspirin  EC 81 MG tablet Take 1 tablet (81 mg total) by mouth daily. Swallow whole. 08/16/24   Dunn, Dayna N, PA-C  cyclobenzaprine (FLEXERIL) 10 MG tablet Take 10 mg by mouth 2 (two) times daily as needed for muscle spasms. 06/09/24   [provider]  famotidine (PEPCID) 20 MG tablet Take 20 mg by mouth daily as needed for heartburn or indigestion.    [provider]  fluticasone  (FLONASE ) 50 MCG/ACT nasal spray Place 1-2 sprays into both nostrils daily as needed for allergies. 03/07/22   [provider]  ipratropium-albuterol  (DUONEB) 0.5-2.5 (3) MG/3ML SOLN Inhale 3 mLs into the lungs every 6 (six) hours as needed (wheezing / shortness of breath). 07/25/24   [provider]  isosorbide  mononitrate (IMDUR ) 30 MG 24 hr tablet Take 1 tablet (30 mg total) by mouth daily. 08/16/24   Dunn, Dayna N, PA-C  metoprolol  tartrate (LOPRESSOR ) 25 MG tablet Take 1 tablet (25 mg total) by mouth 2 (two) times daily. 08/15/24   Dunn, Dayna N, PA-C  nitroGLYCERIN  (NITROSTAT ) 0.4 MG SL tablet Place 1 tablet (0.4 mg total) under the tongue every 5 (five) minutes as needed for chest pain (up to 3 doses). 08/15/24 08/15/25  Dunn, Dayna N, PA-C  olmesartan  (BENICAR ) 20 MG tablet Take 1 tablet (20 mg total) by mouth daily. 02/02/23   Singh, Prashant K, MD  pantoprazole  (PROTONIX ) 40 MG tablet Take 1 tablet (40 mg total) by mouth daily. 08/16/24   Dunn, Dayna N, PA-C  rosuvastatin  (CRESTOR ) 40 MG tablet Take 1 tablet (40 mg total) by mouth daily. 08/15/24   Dunn, Dayna N, PA-C  ticagrelor  (BRILINTA ) 90 MG TABS tablet Take 1 tablet (90 mg total) by mouth 2 (two) times daily. 08/15/24   Dunn, Dayna N, PA-C  torsemide (DEMADEX) 20 MG tablet Take 20 mg by mouth daily as needed (edema). 06/22/24   [provider]  TRELEGY ELLIPTA 100-62.5-25 MCG/ACT AEPB Inhale 1 puff into the lungs daily at 12 noon.    [provider]  Vitamin D, Ergocalciferol, (DRISDOL) 1.25 MG (50000 UNIT) CAPS capsule Take 50,000 Units by mouth every 7 (seven) days. 03/07/24   [provider]    Family History    Family History  Problem Relation Age of Onset   Heart attack Father    Ovarian cancer Sister    Breast cancer Sister    She indicated that the status of her father is unknown. She indicated that the status of her sister is unknown.  Social History    Social History   Socioeconomic History   Marital status: Single    Spouse name: Not on file   Number of children: 1   Years of education: Not on file   Highest education level: Not on file  Occupational History   Not on file  Tobacco Use   Smoking status: Former   Smokeless tobacco: Never  Vaping Use   Vaping  status: Never Used  Substance and Sexual Activity   Alcohol use: Yes    Alcohol/week: 1.0 standard drink of alcohol    Types: 1 Glasses of wine per week    Comment: occ   Drug use: Not Currently   Sexual activity: Not Currently  Other Topics Concern   Not on file  Social History Narrative   Not on file   Social Drivers of Health   Financial Resource Strain: Not on file  Food Insecurity: No Food Insecurity (08/15/2024)   Hunger Vital Sign    Worried About Running Out of Food in the Last Year: Never true    Ran Out of Food in the Last Year: Never true  Transportation Needs: No Transportation Needs (08/15/2024)   PRAPARE - Administrator, Civil Service (Medical): No    Lack of Transportation (Non-Medical): No  Physical Activity: Not on file  Stress: Not on file  Social Connections: Moderately Isolated (08/15/2024)   Social Connection and Isolation Panel    Frequency of Communication with Friends and Family: More than three times a week    Frequency of Social Gatherings with Friends and Family:  More than three times a week    Attends Religious Services: More than 4 times per year    Active Member of [employer] or Organizations: No    Attends Banker Meetings: Never    Marital Status: Widowed  Intimate Partner Violence: Not At Risk (08/15/2024)   Humiliation, Afraid, Rape, and Kick questionnaire    Fear of Current or Ex-Partner: No    Emotionally Abused: No    Physically Abused: No    Sexually Abused: No     Review of Systems    General:  No chills, fever, night sweats or weight changes.  Cardiovascular:  No chest pain, dyspnea on exertion, edema, orthopnea, palpitations, paroxysmal nocturnal dyspnea. Dermatological: No rash, lesions/masses Respiratory: No cough, dyspnea Urologic: No hematuria, dysuria Abdominal:   No nausea, vomiting, diarrhea, bright red blood per rectum, melena, or hematemesis Neurologic:  No visual changes, wkns, changes in mental status. All other systems reviewed and are otherwise negative except as noted above.  Physical Exam    VS:  BP 130/70 (BP Location: Left Arm, Patient Position: Sitting, Cuff Size: Normal)   Pulse 64   Ht 5' 5.5 (1.664 m)   Wt 145 lb (65.8 kg)   BMI 23.76 kg/m  , BMI Body mass index is 23.76 kg/m. GEN: Well nourished, well developed, in no acute distress. HEENT: normal. Neck: Supple, no JVD, carotid bruits, or masses. Cardiac: RRR, no murmurs, rubs, or gallops. No clubbing, cyanosis, edema.  Radials/DP/PT 2+ and equal bilaterally.  Respiratory:  Respirations regular and unlabored, clear to auscultation bilaterally. GI: Soft, nontender, nondistended, BS + x 4. MS: no deformity or atrophy. Skin: warm and dry, no rash. Neuro:  Strength and sensation are intact. Psych: Normal affect.  Accessory Clinical Findings    Recent Labs: 08/15/2024: BUN 9; Creatinine, Ser 1.06; Hemoglobin 11.8; Platelets 203; Potassium 3.6; Sodium 138   Recent Lipid Panel    Component Value Date/Time   CHOL 216 (H) 08/14/2024 1433    TRIG 90 08/14/2024 1433   HDL 115 08/14/2024 1433   CHOLHDL 1.9 08/14/2024 1433   VLDL 18 08/14/2024 1433   LDLCALC 83 08/14/2024 1433         ECG personally reviewed by me today- EKG Interpretation Date/Time:  Thursday August 23 2024 10:54:03 EST Ventricular Rate:  68 PR  Interval:  140 QRS Duration:  78 QT Interval:  468 QTC Calculation: 497 R Axis:   7  Text Interpretation: Normal sinus rhythm Low voltage QRS Cannot rule out Anterior infarct , age undetermined T wave abnormality, consider inferolateral ischemia When compared with ECG of 15-Aug-2024 07:01, Right bundle branch block is no longer Present Minimal criteria for Anterior infarct are now Present Confirmed by Teresa Fish [phone]) on 08/23/2024 11:22:24 AM    Diagnostic Dominance: Right  Intervention         Assessment & Plan   NSTEMI-no chest pain today.  Underwent LHC with PCI and DES to her proximal-mid RCA.  She has residual disease in her LAD and staged PCI was planned.  Echocardiogram showed an LVEF of 50-55%, G1 DD, trivial MR and dilated IVC. Continue Brilinta , aspirin , rosuvastatin , Imdur , metoprolol . Has been taking Torsemide every other day. Educated to only take as needed if swelling persists and to contact us  if she is taking 2 or more per week.  Heart healthy low-sodium diet Proceed to LHC for LAD. Patient on DAPT. Will continue through catheterization.  Order CBC, BMET.  Hyperlipidemia-LDL83.  Goal LDL less than 55.  Her simvastatin  was transition to rosuvastatin . High-fiber diet. Continue aspirin , rosuvastatin  Plan for repeat fasting lipids and LFTs in 4-6 weeks  Essential hypertension-BP today 130/70. Maintain blood pressure log Continue olmesartan , metoprolol  Heart healthy low-sodium diet   Disposition: Follow-up with Dr. Ladona or me after staged PCI  Josefa HERO. Cleaver NP-C     08/23/2024, 12:06 PM [facility] [address]  [address] [place], [place] [address] Office [phone]    Notice: This dictation was prepared with Dragon dictation along with smaller phrase technology. Any transcriptional errors that result from this process are unintentional and may not be corrected upon review.   I spent 14 minutes examining this patient, reviewing medications, and using patient centered shared decision making involving their cardiac care.   I spent  20 minutes reviewing past medical history,  medications, and prior cardiac tests.

## 2024-08-23 ENCOUNTER — Ambulatory Visit: Attending: General Practice

## 2024-08-23 ENCOUNTER — Encounter: Payer: Self-pay | Admitting: General Practice

## 2024-08-23 VITALS — BP 130/70 | HR 64 | Ht 65.5 in | Wt 145.0 lb

## 2024-08-23 DIAGNOSIS — I25110 Atherosclerotic heart disease of native coronary artery with unstable angina pectoris: Secondary | ICD-10-CM | POA: Diagnosis not present

## 2024-08-23 DIAGNOSIS — I214 Non-ST elevation (NSTEMI) myocardial infarction: Secondary | ICD-10-CM | POA: Diagnosis not present

## 2024-08-23 DIAGNOSIS — I639 Cerebral infarction, unspecified: Secondary | ICD-10-CM

## 2024-08-23 DIAGNOSIS — I10 Essential (primary) hypertension: Secondary | ICD-10-CM | POA: Diagnosis not present

## 2024-08-23 NOTE — Patient Instructions (Signed)
 Medication Instructions:  NO CHANGES  Lab Work: BMET AND CBC TO BE DONE TODAY.  Testing/Procedures:  [place] [facility] A DEPT OF [place]. [facility] [address] A DEPT OF THE [place]. [facility] [address] [place] [address] Dept: [phone] Loc: [phone]  Morgan Sparks  08/23/2024  You are scheduled for a Cardiac Catheterization on Thursday, December 11 with Dr. Gordy Bergamo.  1. Please arrive at the [facility] (Main Entrance A) at [facility]: [address] [place], [place] [address] at 7:00 AM (This time is 2 hour(s) before your procedure to ensure your preparation).   Free valet parking service is available. You will check in at ADMITTING. The support person will be asked to wait in the waiting room.  It is OK to have someone drop you off and come back when you are ready to be discharged.    Special note: Every effort is made to have your procedure done on time. Please understand that emergencies sometimes delay scheduled procedures.  2. Diet: Nothing to eat after midnight.   3. Hydration: You need to be well hydrated before your procedure. On August 30, 2024 you may drink approved liquids (see below) until 2 hours before the procedure, with 16 oz of water  as your last intake.   List of approved liquids water , clear juice, clear tea, black coffee, fruit juices, non-citric and without pulp, carbonated beverages, Gatorade, Kool -Aid, plain Jello-O and plain ice popsicles.  4. Labs: You will need to have blood drawn on Thursday, December 4 at [facility] (1st Floor), [address] [place], [place] [address]. You do not need to be fasting.  5. Medication instructions in preparation for your procedure:   Contrast Allergy: No   Stop taking, Benicar  (Olmesartan ) Wednesday, August 29, 2024. Stop taking, TORESIMIDE Wednesday, August 29, 2024.  On the morning of your procedure,  take your Aspirin  81 mg and any morning medicines NOT listed above.  You may use sips of water .  6. Plan to go home the same day, you will only stay overnight if medically necessary. 7. Bring a current list of your medications and current insurance cards. 8. You MUST have a responsible person to drive you home. 9. Someone MUST be with you the first 24 hours after you arrive home or your discharge will be delayed. 10. Please wear clothes that are easy to get on and off and wear slip-on shoes.  Thank you for allowing us  to care for you!   -- [place]   Follow-Up: At [facility], you and your health needs are our priority.  As part of our continuing mission to provide you with exceptional heart care, our providers are all part of one team.  This team includes your primary Cardiologist (physician) and Advanced Practice Providers or APPs (Physician Assistants and Nurse Practitioners) who all work together to provide you with the care you need, when you need it.  Your next appointment:   POST CATH  Provider:   Gordy Bergamo, MD OR Josefa Beauvais, NP

## 2024-08-24 ENCOUNTER — Other Ambulatory Visit: Payer: Self-pay

## 2024-08-24 ENCOUNTER — Ambulatory Visit: Payer: Self-pay

## 2024-08-24 DIAGNOSIS — I214 Non-ST elevation (NSTEMI) myocardial infarction: Secondary | ICD-10-CM

## 2024-08-24 LAB — CBC
Hematocrit: 41.2 % (ref 34.0–46.6)
Hemoglobin: 13.9 g/dL (ref 11.1–15.9)
MCH: 36.7 pg — ABNORMAL HIGH (ref 26.6–33.0)
MCHC: 33.7 g/dL (ref 31.5–35.7)
MCV: 109 fL — ABNORMAL HIGH (ref 79–97)
Platelets: 279 x10E3/uL (ref 150–450)
RBC: 3.79 x10E6/uL (ref 3.77–5.28)
RDW: 17.1 % — ABNORMAL HIGH (ref 11.7–15.4)
WBC: 5.5 x10E3/uL (ref 3.4–10.8)

## 2024-08-24 LAB — BASIC METABOLIC PANEL WITH GFR
BUN/Creatinine Ratio: 6 — AB (ref 12–28)
BUN: 9 mg/dL (ref 8–27)
CO2: 26 mmol/L (ref 20–29)
Calcium: 10.2 mg/dL (ref 8.7–10.3)
Chloride: 101 mmol/L (ref 96–106)
Creatinine, Ser: 1.39 mg/dL — AB (ref 0.57–1.00)
Glucose: 85 mg/dL (ref 70–99)
Potassium: 4.6 mmol/L (ref 3.5–5.2)
Sodium: 141 mmol/L (ref 134–144)
eGFR: 40 mL/min/1.73 — AB (ref >59)

## 2024-08-27 LAB — BASIC METABOLIC PANEL WITH GFR
BUN/Creatinine Ratio: 6 — ABNORMAL LOW (ref 12–28)
BUN: 18 mg/dL (ref 8–27)
CO2: 21 mmol/L (ref 20–29)
Calcium: 10.3 mg/dL (ref 8.7–10.3)
Chloride: 105 mmol/L (ref 96–106)
Creatinine, Ser: 2.79 mg/dL — ABNORMAL HIGH (ref 0.57–1.00)
Glucose: 90 mg/dL (ref 70–99)
Potassium: 4 mmol/L (ref 3.5–5.2)
Sodium: 141 mmol/L (ref 134–144)
eGFR: 17 mL/min/1.73 — ABNORMAL LOW (ref >59)

## 2024-08-28 ENCOUNTER — Telehealth: Payer: Self-pay | Admitting: Pharmacy Technician

## 2024-08-28 ENCOUNTER — Other Ambulatory Visit (HOSPITAL_COMMUNITY): Payer: Self-pay

## 2024-08-28 ENCOUNTER — Ambulatory Visit: Payer: Self-pay

## 2024-08-28 ENCOUNTER — Telehealth: Payer: Self-pay

## 2024-08-28 DIAGNOSIS — R7989 Other specified abnormal findings of blood chemistry: Secondary | ICD-10-CM

## 2024-08-28 MED ORDER — PRASUGREL HCL 10 MG PO TABS: 10.0000 mg | ORAL_TABLET | Freq: Every day | ORAL | 11 refills | Status: DC

## 2024-08-28 NOTE — Telephone Encounter (Signed)
 Yes swap to Effient  10 mg daily and no bolus needed. Thank you

## 2024-08-28 NOTE — Telephone Encounter (Signed)
 Spoke with patient and shared message from Dr. Ladona. She will finish out her current prescription of Brilinta  and the following day after last dose will start Effient  10 mg daily. Rx sent to [facility] per patient request.

## 2024-08-28 NOTE — Addendum Note (Signed)
 Addended by: VICCI ROXIE CROME on: 08/28/2024 03:20 PM   Modules accepted: Orders

## 2024-08-28 NOTE — Telephone Encounter (Signed)
   Brilinta  brand preferred -can't fill until 09/07/24 to see new copay. Insurance said clopiogrel and prasugrel  is also preferred   Clopidogrel  says it was filled 07/25/24 and too soon until 10/08/24 to see what that copay would be   Prasugrel  would be 4.90 for 30 days  I see notes that plavix  was just changed to brilinta . There is no longer any assistance for brilinta . Would she be able to change to prasugrel  since that is cheaper?    Sent message in original encounter

## 2024-08-28 NOTE — Telephone Encounter (Signed)
 Patient can pay for 15 days or 30 days supply until she can pick up 90 day supply with 3 refills

## 2024-08-28 NOTE — Telephone Encounter (Signed)
 15 day supply of Brilinta  OOP cost would be too much for patient to pay. Unable to fill 30 day supply with insurance coverage as it is too early to fill.  Pharmacy tech states no assistance for Brilinta , but Prasugrel  would $4.90 for 30 day supply. Patient is willing to switch if Dr. Ladona approves, she states she can afford this.  Will forward to Dr. Ladona to review.

## 2024-08-28 NOTE — Telephone Encounter (Signed)
 Pt had concerns about cost for her Brilinta . Can you help with this?

## 2024-08-30 ENCOUNTER — Ambulatory Visit (HOSPITAL_COMMUNITY): Admission: RE | Admit: 2024-08-30 | Admitting: Cardiology

## 2024-08-30 ENCOUNTER — Encounter (HOSPITAL_COMMUNITY): Admission: RE | Payer: Self-pay

## 2024-08-30 LAB — BASIC METABOLIC PANEL WITH GFR
BUN/Creatinine Ratio: 7 — ABNORMAL LOW (ref 12–28)
BUN: 26 mg/dL (ref 8–27)
CO2: 20 mmol/L (ref 20–29)
Calcium: 10.2 mg/dL (ref 8.7–10.3)
Chloride: 106 mmol/L (ref 96–106)
Creatinine, Ser: 3.48 mg/dL — ABNORMAL HIGH (ref 0.57–1.00)
Glucose: 85 mg/dL (ref 70–99)
Potassium: 3.8 mmol/L (ref 3.5–5.2)
Sodium: 143 mmol/L (ref 134–144)
eGFR: 13 mL/min/1.73 — ABNORMAL LOW (ref >59)

## 2024-08-30 SURGERY — CORONARY STENT INTERVENTION
Anesthesia: LOCAL

## 2024-08-31 ENCOUNTER — Emergency Department (HOSPITAL_COMMUNITY)

## 2024-08-31 ENCOUNTER — Telehealth: Payer: Self-pay

## 2024-08-31 ENCOUNTER — Inpatient Hospital Stay (HOSPITAL_COMMUNITY)
Admission: EM | Admit: 2024-08-31 | Discharge: 2024-09-02 | DRG: 682 | Disposition: A | Discharge status: Home / Self Care | Source: Ambulatory Visit | Attending: Internal Medicine | Admitting: Internal Medicine

## 2024-08-31 DIAGNOSIS — N1411 Contrast-induced nephropathy: Secondary | ICD-10-CM | POA: Diagnosis present

## 2024-08-31 DIAGNOSIS — E7800 Pure hypercholesterolemia, unspecified: Secondary | ICD-10-CM | POA: Diagnosis present

## 2024-08-31 DIAGNOSIS — Z881 Allergy status to other antibiotic agents status: Secondary | ICD-10-CM

## 2024-08-31 DIAGNOSIS — Z7982 Long term (current) use of aspirin: Secondary | ICD-10-CM

## 2024-08-31 DIAGNOSIS — Z91018 Allergy to other foods: Secondary | ICD-10-CM

## 2024-08-31 DIAGNOSIS — Z8673 Personal history of transient ischemic attack (TIA), and cerebral infarction without residual deficits: Secondary | ICD-10-CM | POA: Diagnosis not present

## 2024-08-31 DIAGNOSIS — I252 Old myocardial infarction: Secondary | ICD-10-CM

## 2024-08-31 DIAGNOSIS — T508X5A Adverse effect of diagnostic agents, initial encounter: Secondary | ICD-10-CM | POA: Diagnosis present

## 2024-08-31 DIAGNOSIS — E876 Hypokalemia: Secondary | ICD-10-CM | POA: Diagnosis present

## 2024-08-31 DIAGNOSIS — N179 Acute kidney failure, unspecified: Principal | ICD-10-CM | POA: Diagnosis present

## 2024-08-31 DIAGNOSIS — Z7951 Long term (current) use of inhaled steroids: Secondary | ICD-10-CM

## 2024-08-31 DIAGNOSIS — Z7902 Long term (current) use of antithrombotics/antiplatelets: Secondary | ICD-10-CM | POA: Diagnosis not present

## 2024-08-31 DIAGNOSIS — Z885 Allergy status to narcotic agent status: Secondary | ICD-10-CM | POA: Diagnosis not present

## 2024-08-31 DIAGNOSIS — R911 Solitary pulmonary nodule: Secondary | ICD-10-CM | POA: Diagnosis present

## 2024-08-31 DIAGNOSIS — Z87891 Personal history of nicotine dependence: Secondary | ICD-10-CM

## 2024-08-31 DIAGNOSIS — J45909 Unspecified asthma, uncomplicated: Secondary | ICD-10-CM | POA: Diagnosis present

## 2024-08-31 DIAGNOSIS — D631 Anemia in chronic kidney disease: Secondary | ICD-10-CM | POA: Diagnosis present

## 2024-08-31 DIAGNOSIS — J449 Chronic obstructive pulmonary disease, unspecified: Secondary | ICD-10-CM | POA: Diagnosis present

## 2024-08-31 DIAGNOSIS — I213 ST elevation (STEMI) myocardial infarction of unspecified site: Secondary | ICD-10-CM | POA: Diagnosis present

## 2024-08-31 DIAGNOSIS — Z8249 Family history of ischemic heart disease and other diseases of the circulatory system: Secondary | ICD-10-CM | POA: Diagnosis not present

## 2024-08-31 DIAGNOSIS — Z79899 Other long term (current) drug therapy: Secondary | ICD-10-CM | POA: Diagnosis not present

## 2024-08-31 DIAGNOSIS — N1831 Chronic kidney disease, stage 3a: Secondary | ICD-10-CM | POA: Diagnosis present

## 2024-08-31 DIAGNOSIS — Z8041 Family history of malignant neoplasm of ovary: Secondary | ICD-10-CM | POA: Diagnosis not present

## 2024-08-31 DIAGNOSIS — Z803 Family history of malignant neoplasm of breast: Secondary | ICD-10-CM | POA: Diagnosis not present

## 2024-08-31 DIAGNOSIS — I129 Hypertensive chronic kidney disease with stage 1 through stage 4 chronic kidney disease, or unspecified chronic kidney disease: Secondary | ICD-10-CM | POA: Diagnosis present

## 2024-08-31 DIAGNOSIS — Z955 Presence of coronary angioplasty implant and graft: Secondary | ICD-10-CM

## 2024-08-31 DIAGNOSIS — Z90710 Acquired absence of both cervix and uterus: Secondary | ICD-10-CM

## 2024-08-31 DIAGNOSIS — I2510 Atherosclerotic heart disease of native coronary artery without angina pectoris: Secondary | ICD-10-CM | POA: Diagnosis present

## 2024-08-31 LAB — CBC WITH DIFFERENTIAL/PLATELET
Abs Immature Granulocytes: 0.02 K/uL (ref 0.00–0.07)
Basophils Absolute: 0.1 K/uL (ref 0.0–0.1)
Basophils Relative: 1 %
Eosinophils Absolute: 0.1 K/uL (ref 0.0–0.5)
Eosinophils Relative: 1 %
HCT: 43 % (ref 36.0–46.0)
Hemoglobin: 14.3 g/dL (ref 12.0–15.0)
Immature Granulocytes: 0 %
Lymphocytes Relative: 18 %
Lymphs Abs: 1.3 K/uL (ref 0.7–4.0)
MCH: 36.7 pg — ABNORMAL HIGH (ref 26.0–34.0)
MCHC: 33.3 g/dL (ref 30.0–36.0)
MCV: 110.3 fL — ABNORMAL HIGH (ref 80.0–100.0)
Monocytes Absolute: 0.8 K/uL (ref 0.1–1.0)
Monocytes Relative: 11 %
Neutro Abs: 4.7 K/uL (ref 1.7–7.7)
Neutrophils Relative %: 69 %
Platelets: 311 K/uL (ref 150–400)
RBC: 3.9 MIL/uL (ref 3.87–5.11)
RDW: 16.7 % — ABNORMAL HIGH (ref 11.5–15.5)
WBC: 6.9 K/uL (ref 4.0–10.5)
nRBC: 0 % (ref 0.0–0.2)

## 2024-08-31 LAB — COMPREHENSIVE METABOLIC PANEL WITH GFR
ALT: 27 U/L (ref 0–44)
AST: 46 U/L — ABNORMAL HIGH (ref 15–41)
Albumin: 4.5 g/dL (ref 3.5–5.0)
Alkaline Phosphatase: 186 U/L — ABNORMAL HIGH (ref 38–126)
Anion gap: 14 (ref 5–15)
BUN: 35 mg/dL — ABNORMAL HIGH (ref 8–23)
CO2: 22 mmol/L (ref 22–32)
Calcium: 9.9 mg/dL (ref 8.9–10.3)
Chloride: 104 mmol/L (ref 98–111)
Creatinine, Ser: 4.09 mg/dL — ABNORMAL HIGH (ref 0.44–1.00)
GFR, Estimated: 11 mL/min — ABNORMAL LOW (ref >60)
Glucose, Bld: 80 mg/dL (ref 70–99)
Potassium: 3.2 mmol/L — ABNORMAL LOW (ref 3.5–5.1)
Sodium: 140 mmol/L (ref 135–145)
Total Bilirubin: 0.5 mg/dL (ref 0.0–1.2)
Total Protein: 8.1 g/dL (ref 6.5–8.1)

## 2024-08-31 LAB — URINALYSIS, W/ REFLEX TO CULTURE (INFECTION SUSPECTED)
Bilirubin Urine: NEGATIVE
Glucose, UA: NEGATIVE mg/dL
Ketones, ur: NEGATIVE mg/dL
Leukocytes,Ua: NEGATIVE
Nitrite: NEGATIVE
Protein, ur: 100 mg/dL — AB
Specific Gravity, Urine: 1.011 (ref 1.005–1.030)
pH: 6 (ref 5.0–8.0)

## 2024-08-31 MED ORDER — FLUTICASONE PROPIONATE 50 MCG/ACT NA SUSP: 1.0000 | Freq: Every day | NASAL | Status: DC | PRN

## 2024-08-31 MED ORDER — METOPROLOL TARTRATE 25 MG PO TABS
25.0000 mg | ORAL_TABLET | Freq: Two times a day (BID) | ORAL | Status: DC
  Administered 2024-09-02: 25 mg via ORAL
  Filled 2024-08-31 (×2): qty 1

## 2024-08-31 MED ORDER — ACETAMINOPHEN 325 MG PO TABS: 650.0000 mg | ORAL_TABLET | Freq: Four times a day (QID) | ORAL | Status: DC | PRN

## 2024-08-31 MED ORDER — TICAGRELOR 90 MG PO TABS
90.0000 mg | ORAL_TABLET | Freq: Two times a day (BID) | ORAL | Status: DC
  Administered 2024-09-02: 90 mg via ORAL
  Filled 2024-08-31 (×4): qty 1

## 2024-08-31 MED ORDER — VITAMIN D (ERGOCALCIFEROL) 1.25 MG (50000 UNIT) PO CAPS
50000.0000 [IU] | ORAL_CAPSULE | ORAL | Status: DC
  Filled 2024-08-31: qty 1

## 2024-08-31 MED ORDER — HEPARIN SODIUM (PORCINE) 5000 UNIT/ML IJ SOLN
5000.0000 [IU] | Freq: Three times a day (TID) | INTRAMUSCULAR | Status: DC
  Administered 2024-09-01 – 2024-09-02 (×4): 5000 [IU] via SUBCUTANEOUS
  Filled 2024-08-31 (×4): qty 1

## 2024-08-31 MED ORDER — SODIUM CHLORIDE 0.9 % IV BOLUS
500.0000 mL | Freq: Once | INTRAVENOUS | Status: AC
  Administered 2024-08-31: 500 mL via INTRAVENOUS

## 2024-08-31 MED ORDER — ROSUVASTATIN CALCIUM 10 MG PO TABS
40.0000 mg | ORAL_TABLET | Freq: Every day | ORAL | Status: DC
  Administered 2024-09-02: 40 mg via ORAL
  Filled 2024-08-31: qty 2

## 2024-08-31 MED ORDER — SODIUM CHLORIDE 0.9 % IV SOLN: INTRAVENOUS | Status: AC

## 2024-08-31 MED ORDER — ISOSORBIDE MONONITRATE ER 30 MG PO TB24
30.0000 mg | ORAL_TABLET | Freq: Every day | ORAL | Status: DC
  Administered 2024-09-02: 30 mg via ORAL
  Filled 2024-08-31: qty 1

## 2024-08-31 MED ORDER — ASPIRIN 81 MG PO TBEC
81.0000 mg | DELAYED_RELEASE_TABLET | Freq: Every day | ORAL | Status: DC
  Administered 2024-09-02: 81 mg via ORAL
  Filled 2024-08-31: qty 1

## 2024-08-31 MED ORDER — IPRATROPIUM-ALBUTEROL 0.5-2.5 (3) MG/3ML IN SOLN: 3.0000 mL | Freq: Four times a day (QID) | RESPIRATORY_TRACT | Status: DC | PRN

## 2024-08-31 MED ORDER — ACETAMINOPHEN 650 MG RE SUPP: 650.0000 mg | Freq: Four times a day (QID) | RECTAL | Status: DC | PRN

## 2024-08-31 MED ORDER — SODIUM CHLORIDE 0.9 % IV SOLN: INTRAVENOUS | Status: DC

## 2024-08-31 MED ORDER — BUDESON-GLYCOPYRROL-FORMOTEROL 160-9-4.8 MCG/ACT IN AERO
2.0000 | INHALATION_SPRAY | Freq: Two times a day (BID) | RESPIRATORY_TRACT | Status: DC
  Filled 2024-08-31 (×2): qty 5.9

## 2024-08-31 MED ORDER — ALBUTEROL SULFATE (2.5 MG/3ML) 0.083% IN NEBU: 2.5000 mg | INHALATION_SOLUTION | RESPIRATORY_TRACT | Status: DC | PRN

## 2024-08-31 NOTE — ED Notes (Signed)
 Hospitalist at bedside

## 2024-08-31 NOTE — Telephone Encounter (Signed)
 Spoke to patient she stated she is waiting on her ride to take her to [facility] ED.Advised to let ED Dr.know we advised to her go there for worsening kidney functions.

## 2024-08-31 NOTE — Telephone Encounter (Signed)
 Spoke to patient Morgan West NP reviewed your recent lab results and your kidney functions have become worse.She advised you need to go to [facility] ED now.If you cannot find a ride you need to call 911.

## 2024-08-31 NOTE — ED Triage Notes (Incomplete)
 Patient in today reporting abnormal labs per pcp reporting elevated creat.

## 2024-08-31 NOTE — ED Notes (Signed)
 Urine collection delayed due to patient using the restroom just prior to being brought into the room.

## 2024-08-31 NOTE — ED Provider Notes (Signed)
 [place] [facility] DE[facility]MENT AT [facility] Provider Note   CSN: [id number] Arrival date & time: 08/31/24  1431     Patient presents with: Abnormal Lab   Marnee Sherrard is a 73 y.o. female.    Abnormal Lab  Patient is a 73 year old female past medical history significant for hypertension stroke CAD  She presents emergency room today with no symptoms she states that she was told that her kidney function was worsening.  She takes daily aspirin , takes ibuprofen occasionally but very rarely.  Is on ARB for blood pressure.  Denies any vomiting diarrhea chest pain or difficulty breathing.       Prior to Admission medications  Medication Sig Start Date End Date Taking? Authorizing Provider  albuterol  (VENTOLIN  HFA) 108 (90 Base) MCG/ACT inhaler Inhale 2 puffs into the lungs daily as needed for wheezing or shortness of breath.  12/18/19   [provider]  aspirin  EC 81 MG tablet Take 1 tablet (81 mg total) by mouth daily. Swallow whole. 08/16/24   Dunn, Dayna N, PA-C  cyclobenzaprine (FLEXERIL) 10 MG tablet Take 10 mg by mouth 2 (two) times daily as needed for muscle spasms. 06/09/24   [provider]  famotidine (PEPCID) 20 MG tablet Take 20 mg by mouth daily as needed for heartburn or indigestion.    [provider]  fluticasone  (FLONASE ) 50 MCG/ACT nasal spray Place 1-2 sprays into both nostrils daily as needed for allergies. 03/07/22   [provider]  ipratropium-albuterol  (DUONEB) 0.5-2.5 (3) MG/3ML SOLN Inhale 3 mLs into the lungs every 6 (six) hours as needed (wheezing / shortness of breath). 07/25/24   [provider]  isosorbide  mononitrate (IMDUR ) 30 MG 24 hr tablet Take 1 tablet (30 mg total) by mouth daily. 08/16/24   Dunn, Dayna N, PA-C  metoprolol  tartrate (LOPRESSOR ) 25 MG tablet Take 1 tablet (25 mg total) by mouth 2 (two) times daily. 08/15/24   Dunn, Dayna N, PA-C  nitroGLYCERIN  (NITROSTAT ) 0.4 MG SL tablet Place 1  tablet (0.4 mg total) under the tongue every 5 (five) minutes as needed for chest pain (up to 3 doses). 08/15/24 08/15/25  Dunn, Dayna N, PA-C  olmesartan  (BENICAR ) 20 MG tablet Take 1 tablet (20 mg total) by mouth daily. 02/02/23   Singh, Prashant K, MD  pantoprazole  (PROTONIX ) 40 MG tablet Take 1 tablet (40 mg total) by mouth daily. 08/16/24   Dunn, Dayna N, PA-C  prasugrel  (EFFIENT ) 10 MG TABS tablet Take 1 tablet (10 mg total) by mouth daily. 08/28/24   Ladona Heinz, MD  rosuvastatin  (CRESTOR ) 40 MG tablet Take 1 tablet (40 mg total) by mouth daily. 08/15/24   Dunn, Dayna N, PA-C  torsemide (DEMADEX) 20 MG tablet Take 20 mg by mouth daily as needed (edema). 06/22/24   [provider]  TRELEGY ELLIPTA 100-62.5-25 MCG/ACT AEPB Inhale 1 puff into the lungs daily at 12 noon.    [provider]  Vitamin D, Ergocalciferol, (DRISDOL) 1.25 MG (50000 UNIT) CAPS capsule Take 50,000 Units by mouth every 7 (seven) days. 03/07/24   [provider]    Allergies: Dilaudid  [hydromorphone ], Doxycycline, and Strawberry (diagnostic)    Review of Systems  Updated Vital Signs BP (!) 141/82 (BP Location: Left Arm)   Pulse 72   Temp (!) 97.5 F (36.4 C) (Oral)   Resp 18   SpO2 100%   Physical Exam Vitals and nursing note reviewed.  Constitutional:      General: She is not in  acute distress.    Comments: Pleasant well-appearing 73 year old.  In no acute distress.  Sitting comfortably in bed.  Able answer questions appropriately follow commands. No increased work of breathing. Speaking in full sentences.   HENT:     Head: Normocephalic and atraumatic.     Nose: Nose normal.  Eyes:     General: No scleral icterus. Cardiovascular:     Rate and Rhythm: Normal rate and regular rhythm.     Pulses: Normal pulses.     Heart sounds: Normal heart sounds.  Pulmonary:     Effort: Pulmonary effort is normal. No respiratory distress.     Breath sounds: No wheezing.  Abdominal:      Palpations: Abdomen is soft.     Tenderness: There is no abdominal tenderness. There is no guarding or rebound.  Musculoskeletal:     Cervical back: Normal range of motion.     Right lower leg: No edema.     Left lower leg: No edema.  Skin:    General: Skin is warm and dry.     Capillary Refill: Capillary refill takes less than 2 seconds.  Neurological:     Mental Status: She is alert. Mental status is at baseline.  Psychiatric:        Mood and Affect: Mood normal.        Behavior: Behavior normal.     (all labs ordered are listed, but only abnormal results are displayed) Labs Reviewed  COMPREHENSIVE METABOLIC PANEL WITH GFR - Abnormal; Notable for the following components:      Result Value   Potassium 3.2 (*)    BUN 35 (*)    Creatinine, Ser 4.09 (*)    AST 46 (*)    Alkaline Phosphatase 186 (*)    GFR, Estimated 11 (*)    All other components within normal limits  CBC WITH DIFFERENTIAL/PLATELET - Abnormal; Notable for the following components:   MCV 110.3 (*)    MCH 36.7 (*)    RDW 16.7 (*)    All other components within normal limits  URINALYSIS, W/ REFLEX TO CULTURE (INFECTION SUSPECTED) - Abnormal; Notable for the following components:   APPearance HAZY (*)    Hgb urine dipstick MODERATE (*)    Protein, ur 100 (*)    Bacteria, UA RARE (*)    All other components within normal limits    EKG: None  Radiology: No results found.   Procedures   Medications Ordered in the ED  sodium chloride  0.9 % bolus 500 mL (has no administration in time range)    Clinical Course as of 08/31/24 1952  Fri Aug 31, 2024  1945 Some ibuprofen in the past week and daily aspirin   [WF]    Clinical Course User Index [WF] Neldon Hamp RAMAN, [place]                                 Medical Decision Making Amount and/or Complexity of Data Reviewed Labs: ordered.  Risk Decision regarding hospitalization.   This patient presents to the ED for concern of AKI, this involves a number  of treatment options, and is a complaint that carries with it a moderate risk of complications and morbidity. A differential diagnosis was considered for the patient's symptoms which is discussed below:   Considered differential of dehydration, hypovolemia, transient hypotension, CHF with decompensation and poor perfusion.  Think unlikely to be obstructed   Co  morbidities: Discussed in HPI   Brief History:  Patient is a 73 year old female past medical history significant for hypertension stroke CAD  She presents emergency room today with no symptoms she states that she was told that her kidney function was worsening.  She takes daily aspirin , takes ibuprofen occasionally but very rarely.  Is on ARB for blood pressure.  Denies any vomiting diarrhea chest pain or difficulty breathing.    EMR reviewed including pt PMHx, past surgical history and past visits to ER.   See HPI for more details   Lab Tests:   I ordered and independently interpreted labs. Labs notable for AKI uptrending creatinine, elevated BUN.  Alk phos also mildly elevated CBC without leukocytosis or anemia urinalysis with protein  CK added  Imaging Studies:  No imaging studies ordered for this patient    Cardiac Monitoring:  NA  NA   Medicines ordered:  I ordered medication including normal saline 500 mL for hydration Reevaluation of the patient after these medicines showed that the patient stayed the same I have reviewed the patients home medicines and have made adjustments as needed   Critical Interventions:     Consults/Attending Physician   I requested consultation with nephrology,  and discussed lab and imaging findings as well as pertinent plan - they recommend: Gentle fluids, renal ultrasound, hospitalist admission   Reevaluation:  After the interventions noted above I re-evaluated patient and found that they have :stayed the same   Social Determinants of Health:      Problem  List / ED Course:  Patient with AKI did have recent catheterization and stent placement seems to have some wall motion abnormalities on echocardiogram but does have preserved EF.  Will admit to hospitalist for AKI.  Nephrology on board and will evaluate patient.   Dispostion:  After consideration of the diagnostic results and the patients response to treatment, I feel that the patent would benefit from admission.   Final diagnoses:  AKI (acute kidney injury)    ED Discharge Orders     None          Neldon Hamp RAMAN, [place] 08/31/24 1959    Bari Roxie HERO, DO 08/31/24 2251

## 2024-08-31 NOTE — Consult Note (Signed)
 [facility]  HISTORY AND PHYSICAL  Annalynn Centanni is an 73 y.o. female.    Chief Complaint: AKI  HPI: Pt is a 79F with a PMH sig for CAD, HTN, HLD, h/o CVA who is now seen for AKI.  Pt recently d/c'd from [facility] s/p cardiac cath 08/14/24--STEMI with a staged procedure.  150 mL contrast used.  Was urgent so maybe no pre-cath fluids.  Had PO fluids afterwards.  D/c'd on Effient , isosorbide , rosuvastatin  were new meds.    0.88 08/14/24 1.06 08/15/24 1.39 08/23/24 2.79 08/27/24 3.48 08/30/24 4.09 08/31/24  On ARB.  Prescribed torsemide but not taking.  No joint pains.  No foamy/ frothy urine or blood in urine.  Still making urine   PMH: Past Medical History:  Diagnosis Date   Diverticulitis    High cholesterol    Stroke (HCC)    PSH: Past Surgical History:  Procedure Laterality Date   ABDOMINAL HYSTERECTOMY     CORONARY STENT INTERVENTION N/A 08/14/2024   Procedure: CORONARY STENT INTERVENTION;  Surgeon: Ladona Heinz, MD;  Location: [facility] INVASIVE CV LAB;  Service: Cardiovascular;  Laterality: N/A;   LEFT HEART CATH AND CORONARY ANGIOGRAPHY N/A 08/14/2024   Procedure: LEFT HEART CATH AND CORONARY ANGIOGRAPHY;  Surgeon: Ladona Heinz, MD;  Location: [facility] INVASIVE CV LAB;  Service: Cardiovascular;  Laterality: N/A;     Past Medical History:  Diagnosis Date   Diverticulitis    High cholesterol    Stroke (HCC)     Medications:  I have reviewed the patient's current medications.  (Not in a hospital admission)   ALLERGIES:  Allergies[1]  FAM HX: Family History  Problem Relation Age of Onset   Heart attack Father    Ovarian cancer Sister    Breast cancer Sister     Social History:   reports that she has quit smoking. She has never used smokeless tobacco. She reports current alcohol use of about 1.0 standard drink of alcohol per week. She reports that she does not currently use drugs.  ROS: ROS: all other systems reviewed and are negative except as per  HPI  Blood pressure (!) 141/82, pulse 72, temperature (!) 97.5 F (36.4 C), temperature source Oral, resp. rate 18, SpO2 100%. PHYSICAL EXAM: Physical Exam GEN NAD HEENT eomi perrl NECK NO JVD PULM clear CV RRR ABD soft  EXT trace RLE edema NEURO AAO x e SKIN + Bruise from cath still present on arm   Results for orders placed or performed during the hospital encounter of 08/31/24 (from the past 48 hours)  Comprehensive metabolic panel     Status: Abnormal   Collection Time: 08/31/24  6:46 PM  Result Value Ref Range   Sodium 140 135 - 145 mmol/L   Potassium 3.2 (L) 3.5 - 5.1 mmol/L   Chloride 104 98 - 111 mmol/L   CO2 22 22 - 32 mmol/L   Glucose, Bld 80 70 - 99 mg/dL    Comment: Glucose reference range applies only to samples taken after fasting for at least 8 hours.   BUN 35 (H) 8 - 23 mg/dL   Creatinine, Ser 5.90 (H) 0.44 - 1.00 mg/dL   Calcium  9.9 8.9 - 10.3 mg/dL   Total Protein 8.1 6.5 - 8.1 g/dL   Albumin 4.5 3.5 - 5.0 g/dL   AST 46 (H) 15 - 41 U/L   ALT 27 0 - 44 U/L   Alkaline Phosphatase 186 (H) 38 - 126 U/L   Total Bilirubin 0.5 0.0 -  1.2 mg/dL   GFR, Estimated 11 (L) >60 mL/min    Comment: (NOTE) Calculated using the CKD-EPI Creatinine Equation (2021)    Anion gap 14 5 - 15    Comment: Performed at [facility], [address], [place], [place] [address]  CBC with Differential     Status: Abnormal   Collection Time: 08/31/24  6:46 PM  Result Value Ref Range   WBC 6.9 4.0 - 10.5 K/uL   RBC 3.90 3.87 - 5.11 MIL/uL   Hemoglobin 14.3 12.0 - 15.0 g/dL   HCT 56.9 63.9 - 53.9 %   MCV 110.3 (H) 80.0 - 100.0 fL   MCH 36.7 (H) 26.0 - 34.0 pg   MCHC 33.3 30.0 - 36.0 g/dL   RDW 83.2 (H) 88.4 - 84.4 %   Platelets 311 150 - 400 K/uL   nRBC 0.0 0.0 - 0.2 %   Neutrophils Relative % 69 %   Neutro Abs 4.7 1.7 - 7.7 K/uL   Lymphocytes Relative 18 %   Lymphs Abs 1.3 0.7 - 4.0 K/uL   Monocytes Relative 11 %   Monocytes Absolute 0.8 0.1 - 1.0 K/uL    Eosinophils Relative 1 %   Eosinophils Absolute 0.1 0.0 - 0.5 K/uL   Basophils Relative 1 %   Basophils Absolute 0.1 0.0 - 0.1 K/uL   Immature Granulocytes 0 %   Abs Immature Granulocytes 0.02 0.00 - 0.07 K/uL    Comment: Performed at [facility], [address], [place], [place] [address]  Urinalysis, w/ Reflex to Culture (Infection Suspected) -Urine, Clean Catch     Status: Abnormal   Collection Time: 08/31/24  6:59 PM  Result Value Ref Range   Specimen Source URINE, CLEAN CATCH    Color, Urine YELLOW YELLOW   APPearance HAZY (A) CLEAR   Specific Gravity, Urine 1.011 1.005 - 1.030   pH 6.0 5.0 - 8.0   Glucose, UA NEGATIVE NEGATIVE mg/dL   Hgb urine dipstick MODERATE (A) NEGATIVE   Bilirubin Urine NEGATIVE NEGATIVE   Ketones, ur NEGATIVE NEGATIVE mg/dL   Protein, ur 899 (A) NEGATIVE mg/dL   Nitrite NEGATIVE NEGATIVE   Leukocytes,Ua NEGATIVE NEGATIVE   RBC / HPF 0-5 0 - 5 RBC/hpf   WBC, UA 0-5 0 - 5 WBC/hpf    Comment:        Reflex urine culture not performed if WBC <=10, OR if Squamous epithelial cells >5. If Squamous epithelial cells >5 suggest recollection.    Bacteria, UA RARE (A) NONE SEEN   Squamous Epithelial / HPF 0-5 0 - 5 /HPF   Mucus PRESENT     Comment: Performed at [facility], [address] [place], [place] [address]    No results found.  Assessment/Plan  AKI: Cr WNL 08/14/24.  Various factors at play- CIN vs hemodynamics with olmesartan  vs volume depletion (less likely) UA with mod hemoglobin but no RBCs - RUS - gentle IVFs - hold olmesartan  - check CK - check UP/C - hold PPI  2.  HTN:  - metoprolol  and isosorbide   3.  HLD:  - rosuvastatin , checking CK  4.  CAD:  - s/p LHC 11/25, supposed to be a staged procedure  - continue Effient  and ASA  5.  Dispo: admitted  GEARLINE NORRIS 08/31/2024, 8:53 PM        [1]  Allergies Allergen Reactions   Dilaudid  [Hydromorphone ] Itching    Pt not aware     Doxycycline Hives  Strawberry (Diagnostic) Hives

## 2024-08-31 NOTE — H&P (Signed)
 History and Physical    Morgan Sparks FMW:[id number] DOB: Mar 13, 1951 DOA: 08/31/2024  PCP: [facility] [address]  Patient coming from:   I have personally briefly reviewed patient's old medical records in [facility]  Chief Complaint:   HPI: Morgan Sparks is a 73 y.o. 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 For which medical therapy  was recommended. And for D1 disease and focal stenting of the LAD at a later date in the outpatient basis. 3) high grade stenosis in RCA for which she had stent placed in the proximal mid RCA with 2 overlapping DES.Patient currently on effient .  Patient now presents to ED with complaint of abnormal lab at pcp office noting elevated cr 4.09 up from 0.88. Patient notes no symptoms , no n/v/d/ abdominal pain, fever/chills or decrease appetite.    ED Course:   In ED vitals: afeb bp 129/70, hr 72, rr 20  sat 97%   Labs  Na 140, K 3.2, Cl 104, bicarb 22,glu 80, cr 4.09, AST 46, alphos 186  Wbc 6.9, hgb 14.3, plt311,  Afeb bp 141/82, hr 72, rr 18, sat 100% on ra  UA hgb mod, protein 100, ua rare bacteria   Renal u/s:  IMPRESSION: 1. No hydronephrosis or nephrolithiasis. Kidneys normal in size for age with preserved cortical thickness and echogenicity. No acute sonographic abnormality identified.       Review of Systems: As per HPI otherwise 10 point  review of systems negative.   Past Medical History:  Diagnosis Date   Diverticulitis    High cholesterol    Stroke [facility]     Past Surgical History:  Procedure Laterality Date   ABDOMINAL HYSTERECTOMY     CORONARY STENT INTERVENTION N/A 08/14/2024   Procedure: CORONARY STENT INTERVENTION;  Surgeon: Ladona Heinz, MD;  Location: [facility] INVASIVE CV LAB;  Service: Cardiovascular;  Laterality: N/A;   LEFT HEART CATH AND CORONARY ANGIOGRAPHY N/A 08/14/2024   Procedure: LEFT HEART CATH AND CORONARY ANGIOGRAPHY;  Surgeon: Ladona Heinz, MD;  Location: [facility] INVASIVE CV LAB;  Service: Cardiovascular;  Laterality: N/A;     reports that she has quit smoking. She has never used smokeless tobacco. She reports current alcohol use of about 1.0 standard drink of alcohol per week. She reports that she does not currently use drugs.  Allergies[1]  Family History  Problem Relation Age of Onset   Heart attack Father    Ovarian cancer Sister    Breast cancer Sister     Prior to Admission medications  Medication Sig Start Date End Date Taking? Authorizing Provider  albuterol  (VENTOLIN  HFA) 108 (90 Base) MCG/ACT inhaler Inhale 2 puffs into the lungs daily as needed for wheezing or shortness of breath.  12/18/19  Yes [provider]  aspirin  EC 81 MG tablet Take 1 tablet (81 mg total) by mouth daily. Swallow whole. 08/16/24  Yes Dunn, Dayna N, PA-C  cyclobenzaprine (FLEXERIL) 10 MG tablet Take 10 mg by mouth 2 (two)  times daily as needed for muscle spasms. 06/09/24  Yes [provider]  famotidine (PEPCID) 20 MG tablet Take 20 mg by mouth daily as needed for heartburn or indigestion.   Yes [provider]  fluticasone  (FLONASE ) 50 MCG/ACT nasal spray Place 1-2 sprays into both nostrils daily as needed for allergies. 03/07/22  Yes [provider]  ipratropium-albuterol  (DUONEB) 0.5-2.5 (3) MG/3ML SOLN Inhale 3 mLs into the lungs every 6 (six) hours as needed (wheezing / shortness of  breath). 07/25/24  Yes [provider]  isosorbide  mononitrate (IMDUR ) 30 MG 24 hr tablet Take 1 tablet (30 mg total) by mouth daily. 08/16/24  Yes Dunn, Dayna N, PA-C  metoprolol  tartrate (LOPRESSOR ) 25 MG tablet Take 1 tablet (25 mg total) by mouth 2 (two) times daily. 08/15/24  Yes Dunn, Dayna N, PA-C  nitroGLYCERIN  (NITROSTAT ) 0.4 MG SL tablet Place 1 tablet (0.4 mg total) under the tongue every 5 (five) minutes as needed for chest pain (up to 3 doses). 08/15/24 08/15/25 Yes Dunn, Dayna N, PA-C  pantoprazole  (PROTONIX ) 40 MG tablet Take 1 tablet (40 mg total) by mouth daily. 08/16/24  Yes Dunn, Dayna N, PA-C  rosuvastatin  (CRESTOR ) 40 MG tablet Take 1 tablet (40 mg total) by mouth daily. 08/15/24  Yes Dunn, Dayna N, PA-C  ticagrelor  (BRILINTA ) 90 MG TABS tablet Take 90 mg by mouth 2 (two) times daily.   Yes [provider]  TRELEGY ELLIPTA  100-62.5-25 MCG/ACT AEPB Inhale 1 puff into the lungs daily at 12 noon.   Yes [provider]  Vitamin D , Ergocalciferol , (DRISDOL ) 1.25 MG (50000 UNIT) CAPS capsule Take 50,000 Units by mouth every 7 (seven) days. 03/07/24  Yes [provider]  olmesartan  (BENICAR ) 20 MG tablet Take 1 tablet (20 mg total) by mouth daily. Patient not taking: Reported on 08/31/2024 02/02/23   Dennise Lavada POUR, MD  prasugrel  (EFFIENT ) 10 MG TABS tablet Take 1 tablet (10 mg total) by mouth daily. 08/28/24   Ladona Heinz, MD  torsemide (DEMADEX) 20 MG tablet Take 20 mg by mouth daily as needed (edema). Patient not taking: Reported on 08/31/2024 06/22/24   [provider]    Physical Exam: Vitals:   08/31/24 1503 08/31/24 1819 08/31/24 1856 08/31/24 1900  BP: 129/70  (!) 141/82 (!) 141/82  Pulse: 72  72 76  Resp: 20  18   Temp: 98 F (36.7 C)  (!) 97.5 F (36.4 C)   TempSrc: Oral  Oral   SpO2: 97% 97% 100% 96%    Constitutional: NAD, calm, comfortable Vitals:   08/31/24 1503 08/31/24 1819 08/31/24 1856 08/31/24 1900  BP: 129/70   (!) 141/82 (!) 141/82  Pulse: 72  72 76  Resp: 20  18   Temp: 98 F (36.7 C)  (!) 97.5 F (36.4 C)   TempSrc: Oral  Oral   SpO2: 97% 97% 100% 96%   Eyes: PERRL, lids and conjunctivae normal ENMT: Mucous membranes are moist. Posterior pharynx clear of any exudate or lesions.Normal dentition.  Neck: normal, supple, no masses, no thyromegaly Respiratory: clear to auscultation bilaterally, no wheezing, no crackles. Normal respiratory effort. No accessory muscle use.  Cardiovascular: Regular rate and rhythm, no murmurs / rubs / gallops. No extremity edema. 2+ pedal pulses.  Abdomen: no tenderness, no masses palpated. No hepatosplenomegaly. Bowel sounds positive.  Musculoskeletal: no clubbing / cyanosis. No joint deformity upper and lower extremities. Good ROM, no contractures. Normal muscle tone.  Skin: no rashes, lesions, ulcers. No induration Neurologic:  CN 2-12 grossly intact. Sensation intact, Strength 5/5 in all 4.  Psychiatric: Normal judgment and insight. Alert and oriented x 3. Normal mood.    Labs on Admission: I have personally reviewed following labs and imaging studies  CBC: Recent Labs  Lab 08/31/24 1846  WBC 6.9  NEUTROABS 4.7  HGB 14.3  HCT 43.0  MCV 110.3*  PLT 311   Basic Metabolic Panel: Recent Labs  Lab 08/27/24 0854 08/30/24 1108 08/31/24 1846  NA 141 143 140  K 4.0 3.8 3.2*  CL 105 106 104  CO2 21 20 22   GLUCOSE 90 85 80  BUN 18 26 35*  CREATININE 2.79* 3.48* 4.09*  CALCIUM  10.3 10.2 9.9   GFR: Estimated Creatinine Clearance: 11.4 mL/min (A) (by C-G formula based on SCr of 4.09 mg/dL (H)). Liver Function Tests: Recent Labs  Lab 08/31/24 1846  AST 46*  ALT 27  ALKPHOS 186*  BILITOT 0.5  PROT 8.1  ALBUMIN 4.5   No results for input(s): LIPASE, AMYLASE in the last 168 hours. No results for input(s): AMMONIA in the last 168 hours. Coagulation Profile: No results for input(s): INR, PROTIME in the last 168 hours. Cardiac  Enzymes: No results for input(s): CKTOTAL, CKMB, CKMBINDEX, TROPONINI in the last 168 hours. BNP (last 3 results) No results for input(s): PROBNP in the last 8760 hours. HbA1C: No results for input(s): HGBA1C in the last 72 hours. CBG: No results for input(s): GLUCAP in the last 168 hours. Lipid Profile: No results for input(s): CHOL, HDL, LDLCALC, TRIG, CHOLHDL, LDLDIRECT in the last 72 hours. Thyroid  Function Tests: No results for input(s): TSH, T4TOTAL, FREET4, T3FREE, THYROIDAB in the last 72 hours. Anemia Panel: No results for input(s): VITAMINB12, FOLATE, FERRITIN, TIBC, IRON, RETICCTPCT in the last 72 hours. Urine analysis:    Component Value Date/Time   COLORURINE YELLOW 08/31/2024 1859   APPEARANCEUR HAZY (A) 08/31/2024 1859   LABSPEC 1.011 08/31/2024 1859   PHURINE 6.0 08/31/2024 1859   GLUCOSEU NEGATIVE 08/31/2024 1859   HGBUR MODERATE (A) 08/31/2024 1859   BILIRUBINUR NEGATIVE 08/31/2024 1859   KETONESUR NEGATIVE 08/31/2024 1859   PROTEINUR 100 (A) 08/31/2024 1859   NITRITE NEGATIVE 08/31/2024 1859   LEUKOCYTESUR NEGATIVE 08/31/2024 1859    Radiological Exams on Admission: US  RENAL Result Date: 08/31/2024 EXAM: US  Retroperitoneum Complete, Renal. 08/31/2024 08:22:24 PM TECHNIQUE: Real-time ultrasonography of the retroperitoneum renal was performed. COMPARISON: None available CLINICAL HISTORY: AKI (acute kidney injury). FINDINGS: FINDINGS: RIGHT KIDNEY/URETER: Right kidney measures 10.5 x 4.3 x 4.6 cm. Normal cortical echogenicity. No hydronephrosis. No calculus. No mass. LEFT KIDNEY/URETER: Left kidney measures 10.0 x 4.8 x 4.1 cm. Normal cortical echogenicity. No hydronephrosis. No calculus. No mass. BLADDER: Unremarkable appearance of the bladder. IMPRESSION: 1. No hydronephrosis or nephrolithiasis. Kidneys normal in size for age with preserved cortical thickness and echogenicity. No acute sonographic abnormality  identified. Electronically signed by: Greig Pique MD 08/31/2024 09:22 PM EST RP Workstation: [id number]    EKG: Independently reviewed.   Assessment/Plan   AKI  -presumed multifactorial due to Contrast induced nephropathy in setting of recent cath as well as continued use of arb -admit to med tele  - currently electrolytes are stable, and patient is asx  -continue to monitor uop colsely  -continue with ivfs  -per renal check ck / UP/C -hold ppi per renal   CAD s/p recent NSTEMI s/p DES -continue on effient  metoprolol , imdur , asa ,statin   -no cardiac symptoms   Hypertension  -continue on metoprolol  25mg   BID ,monitor for bradycardia   -hold  olmesartan  -currently stable   HLD  -continue statin     Anemia  -currently stable   Pulmonary nodule  -f/u with pcp      DVT prophylaxis: heparin  Code Status: full/ as discussed per patient wishes in event of cardiac arrest  Family Communication: none at bedside Disposition Plan: patient  expected to be admitted greater than 2 midnights  Consults called: Dr Gearline :renal Admission status: med tele   Camila DELENA Ned MD [facility]   If 7PM-7AM, please contact night-coverage [email or web address] Password [email or web address]  08/31/2024, 10:47 PM        [1]  Allergies Allergen Reactions   Dilaudid  [Hydromorphone ] Itching    Pt not aware    Doxycycline Hives   Strawberry (Diagnostic) Hives

## 2024-09-01 LAB — BASIC METABOLIC PANEL WITH GFR
Anion gap: 10 (ref 5–15)
BUN: 33 mg/dL — ABNORMAL HIGH (ref 8–23)
CO2: 18 mmol/L — ABNORMAL LOW (ref 22–32)
Calcium: 9.2 mg/dL (ref 8.9–10.3)
Chloride: 110 mmol/L (ref 98–111)
Creatinine, Ser: 3.43 mg/dL — ABNORMAL HIGH (ref 0.44–1.00)
GFR, Estimated: 14 mL/min — ABNORMAL LOW (ref >60)
Glucose, Bld: 87 mg/dL (ref 70–99)
Potassium: 3.9 mmol/L (ref 3.5–5.1)
Sodium: 138 mmol/L (ref 135–145)

## 2024-09-01 LAB — CBC
HCT: 36.9 % (ref 36.0–46.0)
HCT: 37.7 % (ref 36.0–46.0)
Hemoglobin: 12.1 g/dL (ref 12.0–15.0)
Hemoglobin: 12.7 g/dL (ref 12.0–15.0)
MCH: 36.6 pg — ABNORMAL HIGH (ref 26.0–34.0)
MCH: 37.1 pg — ABNORMAL HIGH (ref 26.0–34.0)
MCHC: 32.8 g/dL (ref 30.0–36.0)
MCHC: 33.7 g/dL (ref 30.0–36.0)
MCV: 110.2 fL — ABNORMAL HIGH (ref 80.0–100.0)
MCV: 111.5 fL — ABNORMAL HIGH (ref 80.0–100.0)
Platelets: 249 K/uL (ref 150–400)
Platelets: 260 K/uL (ref 150–400)
RBC: 3.31 MIL/uL — ABNORMAL LOW (ref 3.87–5.11)
RBC: 3.42 MIL/uL — ABNORMAL LOW (ref 3.87–5.11)
RDW: 16.6 % — ABNORMAL HIGH (ref 11.5–15.5)
RDW: 16.7 % — ABNORMAL HIGH (ref 11.5–15.5)
WBC: 4.8 K/uL (ref 4.0–10.5)
WBC: 5.8 K/uL (ref 4.0–10.5)
nRBC: 0 % (ref 0.0–0.2)
nRBC: 0 % (ref 0.0–0.2)

## 2024-09-01 LAB — HEMOGLOBIN A1C
Hgb A1c MFr Bld: 5.8 % — ABNORMAL HIGH (ref 4.8–5.6)
Mean Plasma Glucose: 119.76 mg/dL

## 2024-09-01 LAB — COMPREHENSIVE METABOLIC PANEL WITH GFR
ALT: 20 U/L (ref 0–44)
AST: 36 U/L (ref 15–41)
Albumin: 3.8 g/dL (ref 3.5–5.0)
Alkaline Phosphatase: 160 U/L — ABNORMAL HIGH (ref 38–126)
Anion gap: 14 (ref 5–15)
BUN: 36 mg/dL — ABNORMAL HIGH (ref 8–23)
CO2: 19 mmol/L — ABNORMAL LOW (ref 22–32)
Calcium: 9.1 mg/dL (ref 8.9–10.3)
Chloride: 108 mmol/L (ref 98–111)
Creatinine, Ser: 4.07 mg/dL — ABNORMAL HIGH (ref 0.44–1.00)
GFR, Estimated: 11 mL/min — ABNORMAL LOW (ref >60)
Glucose, Bld: 79 mg/dL (ref 70–99)
Potassium: 3 mmol/L — ABNORMAL LOW (ref 3.5–5.1)
Sodium: 141 mmol/L (ref 135–145)
Total Bilirubin: 0.3 mg/dL (ref 0.0–1.2)
Total Protein: 6.7 g/dL (ref 6.5–8.1)

## 2024-09-01 LAB — CK
Total CK: 139 U/L (ref 38–234)
Total CK: 138 U/L (ref 38–234)

## 2024-09-01 LAB — TSH: TSH: 0.544 u[IU]/mL (ref 0.350–4.500)

## 2024-09-01 LAB — PRO BRAIN NATRIURETIC PEPTIDE: Pro Brain Natriuretic Peptide: 545 pg/mL — ABNORMAL HIGH (ref <300.0)

## 2024-09-01 MED ORDER — POTASSIUM CHLORIDE 20 MEQ PO PACK
40.0000 meq | PACK | Freq: Two times a day (BID) | ORAL | Status: AC
  Administered 2024-09-01 – 2024-09-02 (×3): 40 meq via ORAL
  Filled 2024-09-01 (×3): qty 2

## 2024-09-01 MED ORDER — POTASSIUM CHLORIDE CRYS ER 20 MEQ PO TBCR
40.0000 meq | EXTENDED_RELEASE_TABLET | Freq: Once | ORAL | Status: DC
  Filled 2024-09-01: qty 2

## 2024-09-01 MED ORDER — SODIUM CHLORIDE 0.9 % IV SOLN: INTRAVENOUS | Status: DC

## 2024-09-01 MED ORDER — FLUTICASONE-UMECLIDIN-VILANT 100-62.5-25 MCG/ACT IN AEPB
1.0000 | INHALATION_SPRAY | Freq: Every day | RESPIRATORY_TRACT | Status: DC
  Administered 2024-09-02: 1 via RESPIRATORY_TRACT

## 2024-09-01 NOTE — ED Notes (Signed)
Pt. Assisted to bathroom for safety. 

## 2024-09-01 NOTE — Plan of Care (Signed)
 ?  Problem: Clinical Measurements: ?Goal: Ability to maintain clinical measurements within normal limits will improve ?Outcome: Progressing ?  ?Problem: Clinical Measurements: ?Goal: Will remain free from infection ?Outcome: Progressing ?  ?Problem: Clinical Measurements: ?Goal: Respiratory complications will improve ?Outcome: Progressing ?  ?

## 2024-09-01 NOTE — Progress Notes (Addendum)
 PROGRESS NOTE  Morgan Sparks FMW:[id number] DOB: 24-Nov-1950 DOA: 08/31/2024 PCP: [facility] [address]   LOS: 1 day   Brief narrative:   Morgan Sparks is a 72 y.o. 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  He also has recent history of admission  11/25-11/26 with diagnosis of NSTEMI for which she underwent LHC with high-grade stenosis in the midsegment of the LAD, has diffuse disease in the LAD and a moderate-sized D1 with a ostial 90% stenosis and medical therapy was recommended.  Patient also had D1 disease and plan was focal stenting of the LAD at a later date in the outpatient basis.  For high grade stenosis in RCA for which she had stent placed in the proximal mid RCA with 2 overlapping DES.patient was continued on Effient .  In the ED vitals were stable.  Labs were notable for mild hypokalemia with potassium of 3.2 with creatinine elevation at 4.0.  UA was negative for infection.  Renal ultrasound did not show any hydronephrosis.  Patient was then considered for admission to the hospital for further evaluation and treatment.   Assessment/Plan: Principal Problem:   AKI (acute kidney injury)  AKI  Likely multifactorial due to contrast-induced nephropathy in the setting of recent cardiac catheterization and ongoing use of ARB.  Renal ultrasound without any hydronephrosis.  Or PPI.  Continue intake and output charting Daily weights.  Avoid nephrotoxic medication.  Creatinine today at 4.0 from 4.0 yesterday.  Nephrology on board and will follow recommendations.  Currently on IV fluids.  Check BMP in  AM.  Hypokalemia.  Potassium of 3.0 today.  Will replace with 40 mill equivalents of oral potassium today.  Check BMP in AM.   CAD s/p recent NSTEMI s/p DES Continue effient  metoprolol , imdur , asa ,statin.  Denies any chest pain.  Essential hypertension Hold ARB.  Continue metoprolol    Hyperlipidemia Continue Crestor     Anemia  Continue to monitor hemoglobin.   Pulmonary nodule  Follow-up with PCP  DVT prophylaxis: heparin  injection 5,000 Units Start: 09/01/24 0600   Disposition: Home likely in 1 to 2 days  Status is: Inpatient Remains inpatient appropriate because: Pending clinical improvement,    Code Status:     Code Status: Full Code  Family Communication: None at bedside  Consultants: Nephrology  Procedures: None  Anti-infectives:  None  Anti-infectives (From admission, onward)    None        Subjective: Today, patient was seen and examined at bedside.  Patient denies any nausea vomiting fever chills shortness of breath dyspnea.  Has had bowel movements.  Has been urinating.  Objective: Vitals:   09/01/24 0600 09/01/24 0725  BP: 121/70 117/63  Pulse: 61 64  Resp: 16 16  Temp:  98 F (36.7 C)  SpO2: 91% 93%    Intake/Output Summary (Last 24 hours) at 09/01/2024 9041 Last data filed at 08/31/2024 2213 Gross per 24 hour  Intake 500 ml  Output --  Net 500 ml   There were no vitals filed for this visit. There is no height or weight on file to calculate BMI.   Physical Exam: GENERAL: Patient is alert awake and oriented. Not in obvious distress.  HENT: No scleral pallor or icterus. Pupils equally reactive to light. Oral mucosa is moist NECK: is supple, no gross swelling noted. CHEST: Clear to auscultation. No crackles or wheezes.  Diminished breath sounds bilaterally. CVS: S1 and S2 heard, no murmur. Regular rate and rhythm.  ABDOMEN: Soft, non-tender, bowel sounds are present. EXTREMITIES: Trace lower extremity edema. CNS: Cranial  nerves are intact. No focal motor deficits. SKIN: warm and dry without rashes.  Data Review: I have personally reviewed the following laboratory data and studies,  CBC: Recent Labs  Lab 08/31/24 1846 09/01/24 0026  WBC 6.9 5.8  NEUTROABS 4.7  --   HGB 14.3 12.1  HCT 43.0 36.9  MCV 110.3* 111.5*  PLT 311 249   Basic Metabolic Panel: Recent Labs  Lab 08/27/24 0854 08/30/24 1108 08/31/24 1846 09/01/24 0026  NA 141 143 140 141  K 4.0 3.8 3.2* 3.0*  CL 105 106 104 108  CO2 21 20 22  19*  GLUCOSE 90 85 80 79  BUN 18 26 35* 36*  CREATININE 2.79* 3.48* 4.09* 4.07*  CALCIUM  10.3 10.2 9.9 9.1   Liver Function Tests: Recent Labs  Lab 08/31/24 1846 09/01/24 0026  AST 46* 36  ALT 27 20  ALKPHOS 186* 160*  BILITOT 0.5 0.3  PROT 8.1 6.7  ALBUMIN 4.5 3.8   No results for input(s): LIPASE, AMYLASE in the last 168 hours. No results for input(s): AMMONIA in the last 168 hours. Cardiac Enzymes: No results for input(s): CKTOTAL, CKMB, CKMBINDEX, TROPONINI in the last 168 hours. BNP (last 3 results) No results for input(s): BNP in the last 8760 hours.  ProBNP (last 3 results) No results for input(s): PROBNP in the last 8760 hours.  CBG: No results for input(s): GLUCAP in the last 168 hours. No results found for this or any previous visit (from the past 240 hours).   Studies: US  RENAL Result Date: 08/31/2024 EXAM: US  Retroperitoneum Complete, Renal. 08/31/2024 08:22:24 PM TECHNIQUE: Real-time ultrasonography of the retroperitoneum renal was performed. COMPARISON: None available CLINICAL HISTORY: AKI (acute kidney injury). FINDINGS: FINDINGS: RIGHT KIDNEY/URETER: Right kidney measures 10.5 x 4.3 x 4.6 cm. Normal cortical echogenicity. No hydronephrosis. No calculus. No mass. LEFT KIDNEY/URETER: Left kidney measures 10.0 x 4.8 x 4.1 cm. Normal cortical echogenicity. No hydronephrosis. No calculus. No mass. BLADDER: Unremarkable appearance of the bladder.  IMPRESSION: 1. No hydronephrosis or nephrolithiasis. Kidneys normal in size for age with preserved cortical thickness and echogenicity. No acute sonographic abnormality identified. Electronically signed by: Greig Pique MD 08/31/2024 09:22 PM EST RP Workstation: [id number]      Morgan Alstrom, MD  [facility] 09/01/2024  If 7PM-7AM, please contact night-coverage

## 2024-09-01 NOTE — ED Notes (Signed)
 I went into patient's room to administer her ordered medications. She advised she had already taken her own medications earlier-same as what was ordered.

## 2024-09-01 NOTE — Progress Notes (Signed)
 [facility] Progress Note  Subjective:  Voiding well per pt No UOP recorded (was in ED) Creat 4.0 this am and 3.4 this afternoon  Vitals:   09/01/24 0245 09/01/24 0300 09/01/24 0600 09/01/24 0725  BP:  125/70 121/70 117/63  Pulse: 63 64 61 64  Resp: (!) 23 20 16 16   Temp:  98 F (36.7 C)  98 F (36.7 C)  TempSrc:    Oral  SpO2: 91% 90% 91% 93%    Exam: GEN nad HEENT eomi perrl NECK no JVD PULM clear CV RRR ABD soft  EXT trace RLE edema NEURO AAO x e     Home BP meds: Olmesartan  Metoprolol   Demadex (not taking)  Date   Creat  eGFR (ml/min) 2021   0.79- 0.86 > 60 ml/min  3/31- 12/21/23  1.21 >> 1.18 48 - 50 ml/min  01/29/23   1.20  49 ml/min 08/14/24  0.88  > 60 ml/min  08/15/24  1.06  56 ml/min 08/23/24  1.39  40   12/8   2.79  17  12/11   3.48  13 ml/min  08/31/24  4.09  11 ml/min 09/01/24  4.07  11 ml/min  UA 12/12 - prot 100, mod Hb, 0-5 rbc/wbc/epi Renal US  - 10.5/ 10 cm kidneys, no hydro, echotexture wnl BP - normal BP 130/70 on arrival, 117- 135/ 60- 70 today Lab today -> K+ 3.0, CO2 19, BUN 36, creat 4.0, alb 3.8    Assessment/ Plan: AKI on CKD 3a: b/l creatinine 0.9- 1.2 from may 2024- Aug 14, 2024, eGFR 49- >60 ml/min.  Creat here was 3.3 on admission 12/11- the creat was 1.3 one week prior to admit, and 2.7 three days prior to admission. Pt had a heart cath procedure on 11/25 w/ contrast exposure. Pt was also taking ARB prior to that procedure. No hx of n/v/d, not taking diuretics. Creat yest was 4.0 and this was also 4.0 with creat this afternoon down to 3.4.  Suspect this is AKI due to ATN from contrast nephropathy complicated by ARB use. ARB/ hydrochlorothiazide  are on hold appropriately. Renal function may be starting to improve. Euvolemic on exam. Will cont IVFs at 75 cc/hr. F/u labs in am.  Recent NSTEMI: s/p LHC on 11/25 w/ 2-vessel CAD. RCA rx'd and pt needs staged Rx of LAD. Would avoid contrast procedures until renal function  is back to baseline. Would also recommend holding any ACEi/ARB, diuretics for 2-3 days prior to contrast exposure.      Myer Fret MD  [facility] 09/01/2024, 10:38 AM  Recent Labs  Lab 08/31/24 1846 09/01/24 0026  HGB 14.3 12.1  ALBUMIN 4.5 3.8  CALCIUM  9.9 9.1  CREATININE 4.09* 4.07*  K 3.2* 3.0*   No results for input(s): IRON, TIBC, FERRITIN in the last 168 hours. Inpatient medications:  aspirin  EC  81 mg Oral Daily   budesonide -glycopyrrolate -formoterol   2 puff Inhalation BID   heparin   5,000 Units Subcutaneous Q8H   isosorbide  mononitrate  30 mg Oral Daily   metoprolol  tartrate  25 mg Oral BID   potassium chloride   40 mEq Oral Once   rosuvastatin   40 mg Oral Daily   ticagrelor   90 mg Oral BID   Vitamin D  (Ergocalciferol )  50,000 Units Oral Q7 days    sodium chloride  Stopped (09/01/24 0025)   acetaminophen  **OR** acetaminophen , albuterol , cyclobenzaprine, famotidine, fluticasone , ipratropium-albuterol , nitroGLYCERIN 

## 2024-09-02 ENCOUNTER — Other Ambulatory Visit: Payer: Self-pay

## 2024-09-02 ENCOUNTER — Encounter (HOSPITAL_COMMUNITY): Payer: Self-pay | Admitting: Internal Medicine

## 2024-09-02 DIAGNOSIS — N179 Acute kidney failure, unspecified: Principal | ICD-10-CM

## 2024-09-02 LAB — BASIC METABOLIC PANEL WITH GFR
Anion gap: 12 (ref 5–15)
BUN: 29 mg/dL — ABNORMAL HIGH (ref 8–23)
CO2: 18 mmol/L — ABNORMAL LOW (ref 22–32)
Calcium: 9.4 mg/dL (ref 8.9–10.3)
Chloride: 113 mmol/L — ABNORMAL HIGH (ref 98–111)
Creatinine, Ser: 3.32 mg/dL — ABNORMAL HIGH (ref 0.44–1.00)
GFR, Estimated: 14 mL/min — ABNORMAL LOW (ref >60)
Glucose, Bld: 82 mg/dL (ref 70–99)
Potassium: 4.1 mmol/L (ref 3.5–5.1)
Sodium: 143 mmol/L (ref 135–145)

## 2024-09-02 LAB — MAGNESIUM: Magnesium: 2.2 mg/dL (ref 1.7–2.4)

## 2024-09-02 LAB — CBC
HCT: 36.7 % (ref 36.0–46.0)
Hemoglobin: 12.1 g/dL (ref 12.0–15.0)
MCH: 36.1 pg — ABNORMAL HIGH (ref 26.0–34.0)
MCHC: 33 g/dL (ref 30.0–36.0)
MCV: 109.6 fL — ABNORMAL HIGH (ref 80.0–100.0)
Platelets: 248 K/uL (ref 150–400)
RBC: 3.35 MIL/uL — ABNORMAL LOW (ref 3.87–5.11)
RDW: 16.4 % — ABNORMAL HIGH (ref 11.5–15.5)
WBC: 3.6 K/uL — ABNORMAL LOW (ref 4.0–10.5)
nRBC: 0 % (ref 0.0–0.2)

## 2024-09-02 LAB — PHOSPHORUS: Phosphorus: 2.3 mg/dL — ABNORMAL LOW (ref 2.5–4.6)

## 2024-09-02 NOTE — Progress Notes (Signed)
 This charge nurse was made aware that this patient had home meds at bedside and refused to allow our pharmacy to dispense them. Instead patient agreed to call the nurse, go over which meds she's going to take, let us  witness her taking the medication and then documenting in her MAR that those meds were taken. Patient usually takes her home medications at 8am. Patient wants to keep that routine even in the hospital. MD made aware.

## 2024-09-02 NOTE — TOC Initial Note (Signed)
 Transition of Care [facility]) - Initial/Assessment Note    Patient Details  Name: Morgan Sparks MRN: [id number] Date of Birth: 09-12-1951  Transition of Care [facility]) CM/SW Contact:    Sonda Manuella Quill, RN Phone Number: 09/02/2024, 12:47 PM  Clinical Narrative:                 Morgan Sparks w/ pt and family in room; pt said she lives at home; she plans to return at d/c w/ family support; pt identified POC dtr Morgan Sparks [phone]); family will provide transportation; insurance/PCP verified; pt has cane and nebulizer; she does not have HH services; pt says she has prn home oxygen w/ [employer]; she declined having travel tank delivered to room; no IP CM needs.    Barriers to Discharge: No Barriers Identified   Patient Goals and CMS Choice Patient states their goals for this hospitalization and ongoing recovery are:: home     [place] ownership interest in [facility].provided to:: Patient    Expected Discharge Plan and Services   Discharge Planning Services: CM Consult   Living arrangements for the past 2 months: Single Family Home Expected Discharge Date: 09/02/24               DME Arranged: N/A DME Agency: NA       HH Arranged: NA HH Agency: NA        Prior Living Arrangements/Services Living arrangements for the past 2 months: Single Family Home Lives with:: Self Patient language and need for interpreter reviewed:: Yes Do you feel safe going back to the place where you live?: Yes      Need for Family Participation in Patient Care: Yes (Comment) Care giver support system in place?: Yes (comment) Current home services: DME (cane) Criminal Activity/Legal Involvement Pertinent to Current Situation/Hospitalization: No - Comment as needed  Activities of Daily Living   ADL Screening (condition at time of admission) Independently performs ADLs?: Yes (appropriate for developmental age) Is the patient deaf or have difficulty hearing?: No Does the patient have  difficulty seeing, even when wearing glasses/contacts?: No Does the patient have difficulty concentrating, remembering, or making decisions?: No  Permission Sought/Granted Permission sought to share information with : Case Manager Permission granted to share information with : Yes, Verbal Permission Granted  Share Information with NAME: Case Manager     Permission granted to share info w Relationship: Morgan Sparks (dtr) [phone]     Emotional Assessment Appearance:: Appears stated age Attitude/Demeanor/Rapport: Gracious Affect (typically observed): Accepting Orientation: : Oriented to Self, Oriented to Place, Oriented to  Time, Oriented to Situation Alcohol / Substance Use: Not Applicable Psych Involvement: No (comment)  Admission diagnosis:  AKI (acute kidney injury) [N17.9] Patient Active Problem List   Diagnosis Date Noted   Presence of drug coated stent in right coronary artery 08/15/2024   Non-ST elevation (NSTEMI) myocardial infarction (HCC) 08/14/2024   Coronary artery disease involving native coronary artery of native heart with unstable angina pectoris (HCC) 08/14/2024   Acute CVA (cerebrovascular accident) (HCC) 01/30/2023   RUQ pain 12/21/2022   Acute cholecystitis 12/20/2022   AKI (acute kidney injury) 12/20/2022   Hyponatremia 12/20/2022   Asymptomatic bacteriuria 12/20/2022   Essential hypertension 12/20/2022   QT prolongation 12/20/2022   Pulmonary nodule 12/20/2022   Diverticulitis of large intestine with abscess 03/25/2020   Colonic diverticular abscess 03/25/2020   Elevated blood pressure reading 03/25/2020   Hyperlipidemia with target low density lipoprotein (LDL) cholesterol less than 55 mg/dL 92/93/7978  PCP:  [facility] [employer] Pharm[facility]:   [facility] [phone] [facility] [place], [place] - [address] [place] [address] Phone: [phone] Fax: [phone]  [id number] - [place], [place] - [address] AT [facility]  [address] [place] [address] Phone: [phone] Fax: [phone]  [facility] [address] [place] [address] Phone: [phone] Fax: [phone]  [facility] - [place], [place] - [address] [place] [address] Phone: [phone] Fax: [phone]     Social Drivers of Health (SDOH) Social History: SDOH Screenings   Food Insecurity: No Food Insecurity (09/02/2024)  Housing: Low Risk (09/02/2024)  Transportation Needs: No Transportation Needs (09/02/2024)  Utilities: Not At Risk (09/02/2024)  Social Connections: Unknown (09/02/2024)  Recent Concern: Social Connections - Moderately Isolated (08/15/2024)  Tobacco Use: Medium Risk (09/02/2024)   SDOH Interventions: Food Insecurity Interventions: Intervention Not Indicated, Inpatient TOC Housing Interventions: Intervention Not Indicated, Inpatient TOC Transportation Interventions: Intervention Not Indicated, Inpatient TOC Utilities Interventions: Intervention Not Indicated, Inpatient TOC   Readmission Risk Interventions    09/02/2024   12:44 PM  Readmission Risk Prevention Plan  Transportation Screening Complete  PCP or Specialist Appt within 5-7 Days Complete  Home Care Screening Complete  Medication Review (RN CM) Complete

## 2024-09-02 NOTE — Plan of Care (Signed)

## 2024-09-02 NOTE — Progress Notes (Addendum)
 During night shift med pass, pt made this writer aware that she has had her home medication at the bedside. Pt informed writer that she has also taking her night time meds (Metoprolol  and Belinta). Pt refused to give this writer her medication for pharmacy verification. This clinical research associate educated the importance of medication reconciliation. Pt adamant on not giving writer medication. Medication is still at the bedside. Attending provider has been notified.

## 2024-09-02 NOTE — Discharge Summary (Signed)
 Physician Discharge Summary  Morgan Sparks FMW:[id number] DOB: 03-01-1951 DOA: 08/31/2024  PCP: [facility] [address]  Admit date: 08/31/2024 Discharge date: 09/02/2024  Admitted From: Home Disposition:  Home  Discharge Condition:Stable CODE STATUS:FULL Diet recommendation: Heart Healthy    Brief/Interim Summary: Morgan Sparks is a 73 y.o. 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  He also has recent history of admission  11/25-11/26 with diagnosis of NSTEMI for which she underwent LHC with high-grade stenosis in the midsegment of the LAD, has diffuse disease in the LAD and a moderate-sized D1 with a ostial 90% stenosis and medical therapy was recommended.  Patient also had D1 disease and plan was focal stenting of the LAD at a later date in the outpatient basis.  For high grade stenosis in RCA for which she had stent placed in the proximal mid RCA with 2 overlapping DES.patient was continued on Effient .  In the ED vitals were stable.  Labs were notable for mild hypokalemia with potassium of 3.2 with creatinine elevation at 4.0.  UA was negative for infection.  Renal ultrasound did not show any hydronephrosis.  Patient was then considered for admission to the hospital for further evaluation and treatment.  Nephrology consulted on admission.  Kidney function as well improved with IV fluids.  Creatinine in the range of 3 today.  Appears euvolemic.  Nephrology cleared for discharge.  She will be followed by [facility] on discharge.  Medically stable for discharge to home.  Following problems were  addressed during the hospitalization:   AKI  Likely multifactorial due to contrast-induced nephropathy in the setting of recent cardiac catheterization and ongoing use of ARB.  Renal ultrasound without any hydronephrosis.  Nephrology was following.  Given gentle IV fluids.  Creatinine in the range of 3 today.  Nephrology cleared for discharge.  She will be followed by [facility] as an outpatient.   Hypokalemia.  Prevented and corrected  CAD s/p recent NSTEMI s/p DES Continue effient  metoprolol , imdur , asa ,, Brilinta  statin.  Denies any chest pain.  Continue home medications.   Essential hypertension Hold ARB.  Continue metoprolol , Imdur .  Blood pressure stable this morning.   Hyperlipidemia Continue Crestor     Anemia  Continue to monitor hemoglobin.   Pulmonary nodule  Follow-up with PCP   Discharge Diagnoses:  Principal Problem:   AKI (acute kidney injury)    Discharge Instructions  Discharge Instructions     Discharge instructions   Complete by: As directed    1)Please take your medications as instructed 2)Follow up with your PCP in a week.  Do a BMP test to check your kidney function 3)You will be called by nephrology for follow-up appointment   Increase activity slowly   Complete by: As directed       Allergies as of 09/02/2024       Reactions   Dilaudid  [hydromorphone ] Itching   Pt not aware   Doxycycline Hives   Strawberry (diagnostic) Hives        Medication List     STOP taking these medications    olmesartan  20 MG tablet Commonly known as: BENICAR        TAKE these medications  albuterol  108 (90 Base) MCG/ACT inhaler Commonly known as: VENTOLIN  HFA Inhale 2 puffs into the lungs daily as needed for wheezing or shortness of breath.   aspirin  EC 81 MG tablet Take 1 tablet (81 mg total) by mouth daily. Swallow whole.   cyclobenzaprine 10 MG tablet Commonly known as: FLEXERIL Take 10 mg by mouth 2 (two) times daily as needed for  muscle spasms.   famotidine 20 MG tablet Commonly known as: PEPCID Take 20 mg by mouth daily as needed for heartburn or indigestion.   fluticasone  50 MCG/ACT nasal spray Commonly known as: FLONASE  Place 1-2 sprays into both nostrils daily as needed for allergies.   ipratropium-albuterol  0.5-2.5 (3) MG/3ML Soln Commonly known as: DUONEB Inhale 3 mLs into the lungs every 6 (six) hours as needed (wheezing / shortness of breath).   isosorbide  mononitrate 30 MG 24 hr tablet Commonly known as: IMDUR  Take 1 tablet (30 mg total) by mouth daily.   metoprolol  tartrate 25 MG tablet Commonly known as: LOPRESSOR  Take 1 tablet (25 mg total) by mouth 2 (two) times daily.   nitroGLYCERIN  0.4 MG SL tablet Commonly known as: Nitrostat  Place 1 tablet (0.4 mg total) under the tongue every 5 (five) minutes as needed for chest pain (up to 3 doses).   pantoprazole  40 MG tablet Commonly known as: PROTONIX  Take 1 tablet (40 mg total) by mouth daily.   prasugrel  10 MG Tabs tablet Commonly known as: EFFIENT  Take 1 tablet (10 mg total) by mouth daily.   rosuvastatin  40 MG tablet Commonly known as: CRESTOR  Take 1 tablet (40 mg total) by mouth daily.   ticagrelor  90 MG Tabs tablet Commonly known as: BRILINTA  Take 90 mg by mouth 2 (two) times daily.   torsemide 20 MG tablet Commonly known as: DEMADEX Take 20 mg by mouth daily as needed (edema).   Trelegy Ellipta  100-62.5-25 MCG/ACT Aepb Generic drug: Fluticasone -Umeclidin-Vilant Inhale 1 puff into the lungs daily at 12 noon.   Vitamin D  (Ergocalciferol ) 1.25 MG (50000 UNIT) Caps capsule Commonly known as: DRISDOL  Take 50,000 Units by mouth every 7 (seven) days.        Follow-up Information     [facility], [address] Schedule an appointment as soon as possible for a visit in 1 week(s).   Contact information: [address] [place] [address] [phone]                 Allergies[1]  Consultations: Nephrology   Procedures/Studies: US  RENAL Result Date: 08/31/2024 EXAM: US  Retroperitoneum Complete, Renal. 08/31/2024 08:22:24 PM TECHNIQUE: Real-time ultrasonography of the retroperitoneum renal was performed. COMPARISON: None available CLINICAL HISTORY: AKI (acute kidney injury). FINDINGS: FINDINGS: RIGHT KIDNEY/URETER: Right kidney measures 10.5 x 4.3 x 4.6 cm. Normal cortical echogenicity. No hydronephrosis. No calculus. No mass. LEFT KIDNEY/URETER: Left kidney measures 10.0 x 4.8 x 4.1 cm. Normal cortical echogenicity. No hydronephrosis. No calculus. No mass. BLADDER: Unremarkable appearance of the bladder. IMPRESSION: 1. No hydronephrosis or nephrolithiasis. Kidneys normal in size for age with preserved cortical thickness and echogenicity. No acute sonographic abnormality identified. Electronically signed by: Greig Pique MD 08/31/2024 09:22 PM EST RP Workstation: [id number]   ECHOCARDIOGRAM COMPLETE Result Date: 08/15/2024    ECHOCARDIOGRAM REPORT   Patient Name:   Morgan Sparks Date of Exam: 08/15/2024 Medical Rec #:  [id number]     Height:       65.0 in Accession #:    [id number]    Weight:       157.0 lb Date of  Birth:  07-26-1951     BSA:          1.785 m Patient Age:    72 years      BP:           136/68 mmHg Patient Gender: F             HR:           69 bpm. Exam Location:  Inpatient Procedure: 2D Echo, Cardiac Doppler and Color Doppler (Both Spectral and Color            Flow Doppler were utilized during procedure). Indications:    CAD native vessel I25.10  History:        Patient has prior history of Echocardiogram examinations, most                 recent 01/30/2023. Previous Myocardial Infarction and CAD,                 Stroke; Risk Factors:Hypertension and Dyslipidemia.  Sonographer:    Koleen Popper RDCS Referring Phys: [id number] DAVID W HARDING IMPRESSIONS  1. Left ventricular ejection fraction, by estimation, is 50 to 55%. The left ventricle has low  normal function. Left ventricular endocardial border not optimally defined to evaluate regional wall motion. Left ventricular diastolic parameters are consistent with Grade I diastolic dysfunction (impaired relaxation).  2. Right ventricular systolic function is mildly reduced. The right ventricular size is normal. There is normal pulmonary artery systolic pressure. The estimated right ventricular systolic pressure is 29.3 mmHg.  3. The mitral valve is normal in structure. Trivial mitral valve regurgitation. No evidence of mitral stenosis.  4. The aortic valve was not well visualized. Aortic valve regurgitation is trivial. No aortic stenosis is present.  5. The inferior vena cava is dilated in size with >50% respiratory variability, suggesting right atrial pressure of 8 mmHg. FINDINGS  Left Ventricle: Left ventricular ejection fraction, by estimation, is 50 to 55%. The left ventricle has low normal function. Left ventricular endocardial border not optimally defined to evaluate regional wall motion. The left ventricular internal cavity  size was normal in size. There is no left ventricular hypertrophy. Left ventricular diastolic parameters are consistent with Grade I diastolic dysfunction (impaired relaxation). Right Ventricle: The right ventricular size is normal. No increase in right ventricular wall thickness. Right ventricular systolic function is mildly reduced. There is normal pulmonary artery systolic pressure. The tricuspid regurgitant velocity is 2.31 m/s, and with an assumed right atrial pressure of 8 mmHg, the estimated right ventricular systolic pressure is 29.3 mmHg. Left Atrium: Left atrial size was normal in size. Right Atrium: Right atrial size was normal in size. Pericardium: There is no evidence of pericardial effusion. Mitral Valve: The mitral valve is normal in structure. Trivial mitral valve regurgitation. No evidence of mitral valve stenosis. Tricuspid Valve: The tricuspid valve is normal in  structure. Tricuspid valve regurgitation is trivial. Aortic Valve: The aortic valve was not well visualized. Aortic valve regurgitation is trivial. No aortic stenosis is present. Pulmonic Valve: The pulmonic valve was not well visualized. Pulmonic valve regurgitation is not visualized. Aorta: The aortic root is normal in size and structure. Venous: The inferior vena cava is dilated in size with greater than 50% respiratory variability, suggesting right atrial pressure of 8 mmHg. IAS/Shunts: The interatrial septum was not well visualized.  LEFT VENTRICLE PLAX 2D LVIDd:         4.40 cm     Diastology LVIDs:  3.30 cm     LV e' medial:    4.03 cm/s LV PW:         1.00 cm     LV E/e' medial:  14.2 LV IVS:        0.90 cm     LV e' lateral:   4.68 cm/s LVOT diam:     1.70 cm     LV E/e' lateral: 12.3 LV SV:         46 LV SV Index:   26 LVOT Area:     2.27 cm  LV Volumes (MOD) LV vol d, MOD A2C: 80.3 ml LV vol s, MOD A2C: 37.0 ml LV SV MOD A2C:     43.3 ml RIGHT VENTRICLE            IVC RV Basal diam:  4.00 cm    IVC diam: 2.40 cm RV S prime:     7.94 cm/s TAPSE (M-mode): 1.9 cm LEFT ATRIUM           Index        RIGHT ATRIUM           Index LA diam:      2.60 cm 1.46 cm/m   RA Area:     15.70 cm LA Vol (A4C): 21.9 ml 12.27 ml/m  RA Volume:   41.20 ml  23.08 ml/m  AORTIC VALVE LVOT Vmax:   94.30 cm/s LVOT Vmean:  64.700 cm/s LVOT VTI:    0.202 m  AORTA Ao Root diam: 2.90 cm MITRAL VALVE               TRICUSPID VALVE MV Area (PHT): 3.42 cm    TR Peak grad:   21.3 mmHg MV Decel Time: 222 msec    TR Vmax:        231.00 cm/s MV E velocity: 57.40 cm/s MV A velocity: 99.00 cm/s  SHUNTS MV E/A ratio:  0.58        Systemic VTI:  0.20 m                            Systemic Diam: 1.70 cm Lonni Nanas MD Electronically signed by Lonni Nanas MD Signature Date/Time: 08/15/2024/1:39:50 PM    Final    CARDIAC CATHETERIZATION Result Date: 08/14/2024 Images from the original result were not included. Cardiac  Catheterization 08/14/24: Hemodynamic data: LVEDP 17 mmHg.  There is no pressure gradient across the aortic valve. Angiographic data: LM: Mildly calcified, short. LAD: It is a moderate-sized vessel with moderate diffuse disease, moderate amount of diffuse coronary calcifications noted throughout the proximal and mid segment.  Gives origin to a very large septal perforator that traverses all the way towards the apical septum.  Large D1 with a ostial 95% focal calcific stenosis and a moderate-sized D2.  Between D1 and D2 there is calcific focal 90% stenosis.  Apical LAD is moderate to severely diffusely diseased. LCx: It is a moderate caliber vessel giving origin to small marginals 1 through 4.  Mild disease is evident. RCA: A moderate caliber vessel with moderate amount of coronary calcification noted in the proximal and mid segment.  Gives origin to a large RV branch and a large proximal PDA followed by a moderate-sized PDA distally and small sized PL branch.  There are tandem proximal 80%, mid 90% and another 80% calcific stenosis. Intervention data: Successful but complex coronary intervention with a high-grade stenosis, unable to pass a  1.5 mm balloon initially, with help of guide liner successful intervention was performed.  Stenting of the proximal and mid RCA with implantation of 2 overlapping 2.5 x 32 and 2.5 x 16 mm Synergy XD DES deployed at 16 atmospheric pressure and postdilated with stent balloons at 20 atmospheric pressure.  Stenosis reduced from 90% to 0% with TIMI-3 to TIMI-3 flow.     Impression and recommendations: Patient has high-grade stenosis in the midsegment of the LAD, has diffuse disease in the LAD and a moderate-sized D1 with a ostial 90% stenosis.  Would recommend medical therapy for D1 disease and focal stenting of the LAD at a later date in the outpatient basis. Patient will need aggressive risk modification, will need aspirin  + Brilinta  for at least 1 year in view of ACS.   DG Chest  Port 1 View Result Date: 08/14/2024 CLINICAL DATA:  pain EXAM: PORTABLE CHEST 1 VIEW COMPARISON:  Jan 29, 2023, February 21, 2024 FINDINGS: The cardiomediastinal silhouette is unchanged in contour.Cardiac loop recorder. No pleural effusion. No pneumothorax. No acute pleuroparenchymal abnormality. IMPRESSION: No acute cardiopulmonary abnormality. Previously described pulmonary nodule of the RIGHT middle lobe is not discretely visualized radiographically. Recommend nonemergent follow-up chest CT as per CT dated February 21, 2024 recommendation Electronically Signed   By: Corean Salter M.D.   On: 08/14/2024 15:26      Subjective: Patient seen and examined at bedside today.  Hemodynamically stable.  Comfortable.  Eager to go home.  No new complaints.  Medically stable for discharge.  Discharge Exam: Vitals:   09/02/24 0124 09/02/24 0450  BP: (!) 140/83 134/82  Pulse: 67 66  Resp: 20 20  Temp: 98.3 F (36.8 C) 97.6 F (36.4 C)  SpO2: 96% 94%   Vitals:   09/01/24 1705 09/01/24 2056 09/02/24 0124 09/02/24 0450  BP: (!) 148/82 (!) 144/91 (!) 140/83 134/82  Pulse: 60 (!) 59 67 66  Resp:   20 20  Temp: 97.7 F (36.5 C) 97.8 F (36.6 C) 98.3 F (36.8 C) 97.6 F (36.4 C)  TempSrc: Oral Oral Oral Oral  SpO2: 99% 100% 96% 94%  Weight:      Height:        General: Pt is alert, awake, not in acute distress Cardiovascular: RRR, S1/S2 +, no rubs, no gallops Respiratory: CTA bilaterally, no wheezing, no rhonchi Abdominal: Soft, NT, ND, bowel sounds + Extremities: no edema, no cyanosis    The results of significant diagnostics from this hospitalization (including imaging, microbiology, ancillary and laboratory) are listed below for reference.     Microbiology: No results found for this or any previous visit (from the past 240 hours).   Labs: BNP (last 3 results) No results for input(s): BNP in the last 8760 hours. Basic Metabolic Panel: Recent Labs  Lab 08/30/24 1108 08/31/24 1846  09/01/24 0026 09/01/24 1339 09/02/24 0652  NA 143 140 141 138 143  K 3.8 3.2* 3.0* 3.9 4.1  CL 106 104 108 110 113*  CO2 20 22 19* 18* 18*  GLUCOSE 85 80 79 87 82  BUN 26 35* 36* 33* 29*  CREATININE 3.48* 4.09* 4.07* 3.43* 3.32*  CALCIUM  10.2 9.9 9.1 9.2 9.4  MG  --   --   --   --  2.2  PHOS  --   --   --   --  2.3*   Liver Function Tests: Recent Labs  Lab 08/31/24 1846 09/01/24 0026  AST 46* 36  ALT 27 20  ALKPHOS  186* 160*  BILITOT 0.5 0.3  PROT 8.1 6.7  ALBUMIN 4.5 3.8   No results for input(s): LIPASE, AMYLASE in the last 168 hours. No results for input(s): AMMONIA in the last 168 hours. CBC: Recent Labs  Lab 08/31/24 1846 09/01/24 0026 09/01/24 1339 09/02/24 0652  WBC 6.9 5.8 4.8 3.6*  NEUTROABS 4.7  --   --   --   HGB 14.3 12.1 12.7 12.1  HCT 43.0 36.9 37.7 36.7  MCV 110.3* 111.5* 110.2* 109.6*  PLT 311 249 260 248   Cardiac Enzymes: Recent Labs  Lab 09/01/24 1339 09/01/24 1340  CKTOTAL 139 138   BNP: Invalid input(s): POCBNP CBG: No results for input(s): GLUCAP in the last 168 hours. D-Dimer No results for input(s): DDIMER in the last 72 hours. Hgb A1c Recent Labs    09/01/24 1339  HGBA1C 5.8*   Lipid Profile No results for input(s): CHOL, HDL, LDLCALC, TRIG, CHOLHDL, LDLDIRECT in the last 72 hours. Thyroid  function studies Recent Labs    09/01/24 1339  TSH 0.544   Anemia work up No results for input(s): VITAMINB12, FOLATE, FERRITIN, TIBC, IRON, RETICCTPCT in the last 72 hours. Urinalysis    Component Value Date/Time   COLORURINE YELLOW 08/31/2024 1859   APPEARANCEUR HAZY (A) 08/31/2024 1859   LABSPEC 1.011 08/31/2024 1859   PHURINE 6.0 08/31/2024 1859   GLUCOSEU NEGATIVE 08/31/2024 1859   HGBUR MODERATE (A) 08/31/2024 1859   BILIRUBINUR NEGATIVE 08/31/2024 1859   KETONESUR NEGATIVE 08/31/2024 1859   PROTEINUR 100 (A) 08/31/2024 1859   NITRITE NEGATIVE 08/31/2024 1859   LEUKOCYTESUR  NEGATIVE 08/31/2024 1859   Sepsis Labs Recent Labs  Lab 08/31/24 1846 09/01/24 0026 09/01/24 1339 09/02/24 0652  WBC 6.9 5.8 4.8 3.6*   Microbiology No results found for this or any previous visit (from the past 240 hours).  Please note: You were cared for by a hospitalist during your hospital stay. Once you are discharged, your primary care physician will handle any further medical issues. Please note that NO REFILLS for any discharge medications will be authorized once you are discharged, as it is imperative that you return to your primary care physician (or establish a relationship with a primary care physician if you do not have one) for your post hospital discharge needs so that they can reassess your need for medications and monitor your lab values.    Time coordinating discharge: 40 minutes  SIGNED:   Ivonne Mustache, MD  [facility] 09/02/2024, 12:05 PM Pager [phone]  If 7PM-7AM, please contact night-coverage [email or web address] Password [email or web address]     [1]  Allergies Allergen Reactions   Dilaudid  [Hydromorphone ] Itching    Pt not aware    Doxycycline Hives   Strawberry (Diagnostic) Hives

## 2024-09-02 NOTE — Progress Notes (Signed)
 [facility] Progress Note  Subjective:  Labs show creat 3.3 Good UOP reported per the patient (not reported)  Vitals:   09/01/24 1705 09/01/24 2056 09/02/24 0124 09/02/24 0450  BP: (!) 148/82 (!) 144/91 (!) 140/83 134/82  Pulse: 60 (!) 59 67 66  Resp:   20 20  Temp: 97.7 F (36.5 C) 97.8 F (36.6 C) 98.3 F (36.8 C) 97.6 F (36.4 C)  TempSrc: Oral Oral Oral Oral  SpO2: 99% 100% 96% 94%  Weight:      Height:        Exam: GEN nad HEENT eomi perrl NECK no JVD PULM clear CV RRR ABD soft  EXT trace RLE edema NEURO AAO x e     Home BP meds: Olmesartan  Metoprolol   Demadex (not taking)  Date   Creat  eGFR (ml/min) 2021   0.79- 0.86 > 60 ml/min  3/31- 12/21/23  1.21 >> 1.18 48 - 50 ml/min  01/29/23   1.20  49 ml/min 08/14/24  0.88  > 60 ml/min  08/15/24  1.06  56 ml/min 08/23/24  1.39  40   12/8   2.79  17  12/11   3.48  13 ml/min  08/31/24  4.09  11 ml/min 09/01/24  4.07  11 ml/min  UA 12/12 - prot 100, mod Hb, 0-5 rbc/wbc/epi Renal US  - 10.5/ 10 cm kidneys, no hydro, echotexture wnl    Assessment/ Plan: AKI on CKD 3a: b/l creatinine 0.9- 1.2 from may 2024- Aug 14, 2024, eGFR 49- >60 ml/min.  Creat here was 3.3 on admission 12/11- the creat was 1.3 one week prior to admit, and 2.7 three days prior to admission. Pt had a heart cath procedure on 11/25 w/ contrast exposure. Pt was also taking an ARB. No hx of n/v/d, not taking diuretics. UA was unremarkable and renal US  was negative for obstruction. Suspect this is AKI due to ATN from contrast nephropathy possibly complicated by ARB use. ARB/ hydrochlorothiazide  are on hold appropriately. Creat peaked at 4.0 yesterday and is 3.3 today. Good UOP. Pt should refrain from using acei/ aRB/ diuretics for BP control until all cardiac work is completed. Will arrange for renal F/U w/ [facility] in about 2 wks.  HTN: getting metoprolol  25 mg bid; other medications that can be used including calcium  channel blockers,  hydralazine , clonidine.  Recent NSTEMI: s/p LHC on 11/25 w/ 2-vessel CAD. RCA rx'd and pt needs staged Rx of LAD. Would avoid contrast procedures until renal function is back close to baseline; also would avoid all acei's/ARB's until contrast procedures are all completed. Pt will likely be seen by cardiology soon in OP setting. Please make sure that her doctors have these medication and contrast recommendations.      Myer Fret MD  [facility] 09/02/2024, 8:08 AM  Recent Labs  Lab 08/31/24 1846 09/01/24 0026 09/01/24 1339 09/02/24 0652  HGB 14.3 12.1 12.7 12.1  ALBUMIN 4.5 3.8  --   --   CALCIUM  9.9 9.1 9.2  --   CREATININE 4.09* 4.07* 3.43*  --   K 3.2* 3.0* 3.9  --    No results for input(s): IRON, TIBC, FERRITIN in the last 168 hours. Inpatient medications:  aspirin  EC  81 mg Oral Daily   Fluticasone -Umeclidin-Vilant  1 puff Inhalation Daily   heparin   5,000 Units Subcutaneous Q8H   isosorbide  mononitrate  30 mg Oral Daily   metoprolol  tartrate  25 mg Oral BID   potassium chloride   40 mEq Oral BID  rosuvastatin   40 mg Oral Daily   ticagrelor   90 mg Oral BID   Vitamin D  (Ergocalciferol )  50,000 Units Oral Q7 days    sodium chloride  75 mL/hr at 09/01/24 1835   acetaminophen  **OR** acetaminophen , albuterol , cyclobenzaprine, famotidine, fluticasone , ipratropium-albuterol , nitroGLYCERIN 

## 2024-09-03 ENCOUNTER — Telehealth: Payer: Self-pay

## 2024-09-03 NOTE — Telephone Encounter (Signed)
 Spoke to Pharmacist with [employer] she was calling to verify if patient is suppose to take Effient  10 mg and Brilinta  90 mg.Advised I will send message to [facility] for advice.

## 2024-09-03 NOTE — Telephone Encounter (Signed)
 Due to cost, he was given 30 days of Brilinta  but switched over to Effient .  So she is only on Effient  as per recent hospitalization 2 days ago.

## 2024-09-04 NOTE — Telephone Encounter (Signed)
 Spoke to PharmD at [employer].Dr.Ganji advised patient is only taking Effient  10 mg daily.Brillita was stopped due to cost.

## 2024-09-17 ENCOUNTER — Telehealth: Payer: Self-pay | Admitting: Cardiovascular Disease

## 2024-09-17 NOTE — Telephone Encounter (Signed)
 Spoke with patient inquiring about surgery being rescheduled for cath lab.  Per Dr. Ladona: Not really, I would not do PCI. The LAD lesion is significant, but can treat this with medications. Extreme high risk for complete renal failure. Please schedule patient to be seen by me .  Appt made with patient. Instructions given as well. Pt verbalizes understand of plan and will develop new plan at time of appointment with Dr. Ladona.

## 2024-09-17 NOTE — Telephone Encounter (Signed)
 Spoke with patient regarding question for coronary intervention and when this could be rescheduled. December 11 procedure cancelled due to kidney function and hospitalization for AKI. Pt last labs 12/14. January appt with Josefa Beauvais, NP cancelled this am. Pt states he has been told by kidney doctor that kidneys were in great shape prior to previous cath with dye. Also inquiring if something else can be used in place of the dye. Will send to Dr Mealor and Dr Ladona for further recommendations.

## 2024-09-17 NOTE — Telephone Encounter (Signed)
 Patient wants a call back to discuss when next she can have her surgery.

## 2024-09-17 NOTE — Telephone Encounter (Signed)
 Not really, I would not do PCI. The LAD lesion is significant, but can treat this with medications. Extreme high risk for complete renal failure. Please schedule patient to be seen by me

## 2024-09-26 ENCOUNTER — Telehealth: Payer: Self-pay | Admitting: Cardiology

## 2024-09-26 DIAGNOSIS — I214 Non-ST elevation (NSTEMI) myocardial infarction: Secondary | ICD-10-CM

## 2024-09-26 DIAGNOSIS — I25110 Atherosclerotic heart disease of native coronary artery with unstable angina pectoris: Secondary | ICD-10-CM

## 2024-09-26 NOTE — Telephone Encounter (Signed)
 Pt c/o medication issue:  1. Name of Medication:   prasugrel  (EFFIENT ) 10 MG TABS tablet    2. How are you currently taking this medication (dosage and times per day)?    3. Are you having a reaction (difficulty breathing--STAT)? no  4. What is your medication issue? States that medication is making her itch. Seeing what other medication it is. Please advis\e

## 2024-09-27 ENCOUNTER — Other Ambulatory Visit (HOSPITAL_COMMUNITY): Payer: Self-pay

## 2024-09-27 ENCOUNTER — Other Ambulatory Visit: Payer: Self-pay

## 2024-09-27 MED ORDER — PRASUGREL HCL 10 MG PO TABS
10.0000 mg | ORAL_TABLET | Freq: Every day | ORAL | 3 refills | Status: DC
  Filled 2024-09-27: qty 90, 90d supply, fill #0

## 2024-09-27 MED ORDER — TICAGRELOR 90 MG PO TABS
90.0000 mg | ORAL_TABLET | Freq: Two times a day (BID) | ORAL | 3 refills | Status: AC
  Filled 2024-09-27: qty 180, 90d supply, fill #0

## 2024-09-27 NOTE — Telephone Encounter (Signed)
"    ICD-10-CM   1. Non-ST elevation (NSTEMI) myocardial infarction (HCC)  I21.4 prasugrel  (EFFIENT ) 10 MG TABS tablet    2. Coronary artery disease involving native coronary artery of native heart with unstable angina pectoris (HCC)  I25.110 prasugrel  (EFFIENT ) 10 MG TABS tablet     Meds ordered this encounter  Medications   prasugrel  (EFFIENT ) 10 MG TABS tablet    Sig: Take 1 tablet (10 mg total) by mouth daily.    Dispense:  90 tablet    Refill:  3    "

## 2024-09-27 NOTE — Telephone Encounter (Signed)
 This patient is asking the Brilinta  Rx be sent to [facility]. Please advise.   [place] [facility]

## 2024-09-27 NOTE — Addendum Note (Signed)
 Addended by: LADONA MILAN on: 09/27/2024 05:01 PM   Modules accepted: Orders

## 2024-09-27 NOTE — Telephone Encounter (Signed)
"    ICD-10-CM   1. Non-ST elevation (NSTEMI) myocardial infarction (HCC)  I21.4 ticagrelor  (BRILINTA ) 90 MG TABS tablet    DISCONTINUED: prasugrel  (EFFIENT ) 10 MG TABS tablet    2. Coronary artery disease involving native coronary artery of native heart with unstable angina pectoris (HCC)  I25.110 ticagrelor  (BRILINTA ) 90 MG TABS tablet    DISCONTINUED: prasugrel  (EFFIENT ) 10 MG TABS tablet     Meds ordered this encounter  Medications   DISCONTD: prasugrel  (EFFIENT ) 10 MG TABS tablet    Sig: Take 1 tablet (10 mg total) by mouth daily.    Dispense:  90 tablet    Refill:  3   ticagrelor  (BRILINTA ) 90 MG TABS tablet    Sig: Take 1 tablet (90 mg total) by mouth 2 (two) times daily.    Dispense:  180 tablet    Refill:  3    "

## 2024-09-27 NOTE — Telephone Encounter (Signed)
 Spoke with patient of Dr. Ladona. On Effient  for post-cath. Notes itching since she began med, every day, systemic. She has not tried Benadryl  as she wants to get out to drive to [employer] for exercise/walking. Goes 3x/weekly.   Had been on brilinta  90mg  BID previously with no issues, changed d/t cost  Had pharmacy at [facility] run test claim for brilinta  90mg  BID - $5.10/month.   Patient will check with [facility] and [facility] in [place] for prices and get back to us .   Will route to MD so he is aware.

## 2024-09-30 NOTE — Progress Notes (Unsigned)
 " Cardiology Office Note:  .   Date:  10/01/2024  ID:  Morgan Sparks, DOB 08-28-1951, MRN [id number] PCP: [facility]  [facility] Providers Cardiologist:  Gordy Bergamo, MD   History of Present Illness: Morgan Sparks is a 74 y.o. female patient with PMH of NSTEMI on 08/14/2024 and underwent stenting to her proximal and mid RCA and also found to have mid LAD stenosis and was recommended staged intervention but however as patient developed acute kidney failure probably related to contrast, diuretics and olmesartan , her PCI has been postponed indefinitely and recommended aggressive medical therapy.  Past medical history also significant for hyperlipidemia, markedly elevated Lp(a) >200, essential hypertension and grade 1 diastolic dysfunction on echocardiogram 08/15/2024. Her LVEF was noted to be 50-55%. 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 She had a loop recorder implanted.   Due to development of stage IV chronic kidney disease after contrast exposure, her coronary artery disease is being managed medically.  Of interest by CT angio of the chest, abdomen and pelvis on 12/19/2022 had revealed moderate bilateral renal artery stenosis.  Plain ultrasound of the renal bilateral on 09/01/2023 revealed normal-sized kidneys.    Discussed the use of AI scribe software for clinical note transcription with the patient, who gave verbal consent to proceed.  History of Present Illness Morgan Sparks is a 74 year old female with coronary artery disease and stage 4 kidney disease who presents for follow-up regarding her kidney function.  She has coronary artery disease with prior myocardial infarction treated with stent placement. After the procedure she developed significant renal impairment and was diagnosed with stage 4 kidney disease, with creatinine around 3.32 and eGFR 14 mL/min.  She has  occasional epistaxis that she associates with a blood problem. She is not currently followed by a nephrologist and does not recall the prior kidney specialist's name, and she has not had nephrology follow-up since hospital discharge.  Her current medications are unknown and she does not have a medication list today. She is a former smoker who quit years ago. She walks daily but is not participating in cardiac rehabilitation.  She recently met with her primary care doctor at [facility] for ongoing care.  Cardiac Studies relevent.    Cardiac Catheterization 08/14/24:  Stenting of the proximal and mid RCA with implantation of 2 overlapping 2.5 x 32 and 2.5 x 16 mm Synergy XD DES            ECHOCARDIOGRAM COMPLETE 08/15/2024  1. Left ventricular ejection fraction, by estimation, is 50 to 55%. The left ventricle has low normal function. Left ventricular endocardial border not optimally defined to evaluate regional wall motion. Left ventricular diastolic parameters are consistent with Grade I diastolic dysfunction (impaired relaxation). 2. Right ventricular systolic function is mildly reduced. The right ventricular size is normal. There is normal pulmonary artery systolic pressure. The estimated right ventricular systolic pressure is 29.3 mmHg.  EKG:      Labs   Lab Results  Component Value Date   CHOL 216 (H) 08/14/2024   HDL 115 08/14/2024   LDLCALC 83 08/14/2024   TRIG 90 08/14/2024   CHOLHDL 1.9 08/14/2024    Recent Labs    09/01/24 0026 09/01/24 1339 09/02/24 0652  NA 141 138 143  K 3.0* 3.9 4.1  CL 108 110 113*  CO2 19* 18* 18*  GLUCOSE 79 87 82  BUN 36* 33* 29*  CREATININE 4.07* 3.43* 3.32*  CALCIUM  9.1 9.2 9.4  GFRNONAA 11* 14* 14*    Lab Results  Component Value Date   ALT 20 09/01/2024   AST 36 09/01/2024   ALKPHOS 160 (H) 09/01/2024   BILITOT 0.3 09/01/2024      Latest Ref Rng & Units 09/02/2024    6:52 AM 09/01/2024    1:39 PM 09/01/2024   12:26  AM  CBC  WBC 4.0 - 10.5 K/uL 3.6  4.8  5.8   Hemoglobin 12.0 - 15.0 g/dL 87.8  87.2  87.8   Hematocrit 36.0 - 46.0 % 36.7  37.7  36.9   Platelets 150 - 400 K/uL 248  260  249    Lab Results  Component Value Date   HGBA1C 5.8 (H) 09/01/2024    Lab Results  Component Value Date   TSH 0.544 09/01/2024    ROS  Review of Systems  Cardiovascular:  Negative for chest pain, dyspnea on exertion and leg swelling.   Physical Exam:   VS:  BP 116/76   Pulse 63   Ht 5' 5.5 (1.664 m)   Wt 137 lb 9.6 oz (62.4 kg)   SpO2 100%   BMI 22.55 kg/m    Wt Readings from Last 3 Encounters:  10/01/24 137 lb 9.6 oz (62.4 kg)  09/01/24 151 lb 7.3 oz (68.7 kg)  08/23/24 145 lb (65.8 kg)    BP Readings from Last 3 Encounters:  10/01/24 116/76  09/02/24 134/82  08/23/24 130/70   Physical Exam Neck:     Vascular: No carotid bruit or JVD.  Cardiovascular:     Rate and Rhythm: Normal rate and regular rhythm.     Pulses: Intact distal pulses.     Heart sounds: Normal heart sounds. No murmur heard.    No gallop.  Pulmonary:     Effort: Pulmonary effort is normal.     Breath sounds: Normal breath sounds.  Abdominal:     General: Bowel sounds are normal.     Palpations: Abdomen is soft.  Musculoskeletal:     Right lower leg: No edema.     Left lower leg: No edema.     ASSESSMENT AND PLAN: .      ICD-10-CM   1. Coronary artery disease involving native coronary artery of native heart with unstable angina pectoris (HCC)  I25.110 CBC    2. Mixed hypercholesterolemia and hypertriglyceridemia  E78.2 Lipid panel    3. Elevated Lp(a)  E78.41 Lipid panel    4. CKD (chronic kidney disease) stage 4, GFR 15-29 ml/min (HCC)  N18.4 CBC    Comprehensive metabolic panel with GFR    5. Renal artery stenosis  I70.1 MR ANGIO ABDOMEN WO CONTRAST     Assessment & Plan Chronic kidney disease stage 4 due to renal artery stenosis Chronic kidney disease stage 4 with creatinine of 3.32 and eGFR of 14  mL/min/1.73 m2, likely secondary to renal artery stenosis. Previous CT angiogram on 12/19/2022 indicated bilateral renal artery stenosis. Risk of progression to dialysis if not addressed.  Potential for improvement in kidney function with intervention if renal artery stenosis is confirmed. Risks of intervention include potential kidney damage, but procedure is carefully performed by experienced provider.  I have briefly discussed risks of renal arteriogram and possible angioplasty however we will wait for MRI to confirm this. - Ordered renal MRA without contrast to assess renal artery stenosis - Referred to nephrology for further management -  Advised to carry a list of medications at all times - Husband is present.  Coronary artery disease with prior stent placement in the RCA due to acute inferior STEMI has distal LAD stenosis Coronary artery disease with prior stent placement. Current focus on renal artery stenosis due to its impact on kidney function. Heart procedures deferred until renal function is stabilized to avoid dialysis. - Deferred further cardiac interventions until renal function is stabilized  Mixed hyperlipidemia Contributing factor to renal artery stenosis and coronary artery disease. Importance of managing cholesterol levels to prevent further vascular complications discussed. - Ordered lipid panel to assess current cholesterol levels   Follow up: 6 Weekss. F/U Renal MRA, CAD. Will forward my note to Dr. Almarie Bonine, nephrology.  Signed,  Gordy Bergamo, MD, [facility] 10/01/2024, 11:51 AM [facility] [address] [place], [place] [address] Phone: [phone]. Fax:  [phone]  "

## 2024-10-01 ENCOUNTER — Encounter: Payer: Self-pay | Admitting: Cardiology

## 2024-10-01 ENCOUNTER — Ambulatory Visit: Attending: Cardiology

## 2024-10-01 ENCOUNTER — Ambulatory Visit: Admitting: Cardiology

## 2024-10-01 VITALS — BP 116/76 | HR 63 | Ht 65.5 in | Wt 137.6 lb

## 2024-10-01 DIAGNOSIS — I25110 Atherosclerotic heart disease of native coronary artery with unstable angina pectoris: Secondary | ICD-10-CM | POA: Diagnosis not present

## 2024-10-01 DIAGNOSIS — E782 Mixed hyperlipidemia: Secondary | ICD-10-CM | POA: Diagnosis not present

## 2024-10-01 DIAGNOSIS — N184 Chronic kidney disease, stage 4 (severe): Secondary | ICD-10-CM | POA: Diagnosis not present

## 2024-10-01 DIAGNOSIS — E7841 Elevated Lipoprotein(a): Secondary | ICD-10-CM

## 2024-10-01 DIAGNOSIS — I701 Atherosclerosis of renal artery: Secondary | ICD-10-CM

## 2024-10-01 NOTE — Patient Instructions (Signed)
 We recommend signing up for the patient portal called MyChart.  Patients are able to view lab/test results, encounter notes, upcoming appointments, etc.  Non-urgent messages can be sent to your provider as well, go to [email or web address].   Medication Instructions:  No changes *If you need a refill on your cardiac medications before your next appointment, please call your pharmacy*  Lab Work: CBC, CMP, and Lipid panel If you have labs (blood work) drawn today and your tests are completely normal, you will receive your results only by: MyChart Message (if you have MyChart) OR A paper copy in the mail If you have any lab test that is abnormal or we need to change your treatment, we will call you to review the results.  Testing/Procedures: MRI examination of the abdomen  You can call #[phone] to schedule this exam  Follow-Up: At [facility], you and your health needs are our priority.  As part of our continuing mission to provide you with exceptional heart care, our providers are all part of one team.  This team includes your primary Cardiologist (physician) and Advanced Practice Providers or APPs (Physician Assistants and Nurse Practitioners) who all work together to provide you with the care you need, when you need it.  Your next appointment:   6 week(s)  Provider:   Gordy Bergamo, MD    We recommend signing up for the patient portal called MyChart.  Sign up information is provided on this After Visit Summary.  MyChart is used to connect with patients for Virtual Visits (Telemedicine).  Patients are able to view lab/test results, encounter notes, upcoming appointments, etc.  Non-urgent messages can be sent to your provider as well.   To learn more about what you can do with MyChart, go to [email or web address].

## 2024-10-02 ENCOUNTER — Telehealth: Payer: Self-pay | Admitting: Internal Medicine

## 2024-10-02 NOTE — Telephone Encounter (Signed)
 Received call from [facility] regarding critical lab result of K 2.7. Attempted to call patient with no answer. Will leave message for day team to re-attempt contact.

## 2024-10-03 ENCOUNTER — Ambulatory Visit: Payer: Self-pay | Admitting: Cardiology

## 2024-10-03 DIAGNOSIS — E876 Hypokalemia: Secondary | ICD-10-CM

## 2024-10-03 DIAGNOSIS — E7841 Elevated Lipoprotein(a): Secondary | ICD-10-CM

## 2024-10-03 DIAGNOSIS — N184 Chronic kidney disease, stage 4 (severe): Secondary | ICD-10-CM

## 2024-10-03 LAB — LIPID PANEL
Chol/HDL Ratio: 2.1 ratio (ref 0.0–4.4)
Cholesterol, Total: 231 mg/dL — ABNORMAL HIGH (ref 100–199)
HDL: 110 mg/dL
LDL Chol Calc (NIH): 99 mg/dL (ref 0–99)
Triglycerides: 132 mg/dL (ref 0–149)
VLDL Cholesterol Cal: 22 mg/dL (ref 5–40)

## 2024-10-03 LAB — CBC
Hematocrit: 41.9 % (ref 34.0–46.6)
Hemoglobin: 14 g/dL (ref 11.1–15.9)
MCH: 34.7 pg — ABNORMAL HIGH (ref 26.6–33.0)
MCHC: 33.4 g/dL (ref 31.5–35.7)
MCV: 104 fL — ABNORMAL HIGH (ref 79–97)
Platelets: 179 x10E3/uL (ref 150–450)
RBC: 4.03 x10E6/uL (ref 3.77–5.28)
RDW: 15.8 % — ABNORMAL HIGH (ref 11.7–15.4)
WBC: 5.8 x10E3/uL (ref 3.4–10.8)

## 2024-10-03 LAB — COMPREHENSIVE METABOLIC PANEL WITH GFR
ALT: 37 IU/L — ABNORMAL HIGH (ref 0–32)
AST: 62 IU/L — ABNORMAL HIGH (ref 0–40)
Albumin: 4.4 g/dL (ref 3.8–4.8)
Alkaline Phosphatase: 203 IU/L — ABNORMAL HIGH (ref 49–135)
BUN/Creatinine Ratio: 6 — ABNORMAL LOW (ref 12–28)
BUN: 15 mg/dL (ref 8–27)
Bilirubin Total: 0.5 mg/dL (ref 0.0–1.2)
CO2: 22 mmol/L (ref 20–29)
Calcium: 9.8 mg/dL (ref 8.7–10.3)
Chloride: 102 mmol/L (ref 96–106)
Creatinine, Ser: 2.72 mg/dL — ABNORMAL HIGH (ref 0.57–1.00)
Globulin, Total: 2.9 g/dL (ref 1.5–4.5)
Glucose: 89 mg/dL (ref 70–99)
Potassium: 2.7 mmol/L — CL (ref 3.5–5.2)
Sodium: 140 mmol/L (ref 134–144)
Total Protein: 7.3 g/dL (ref 6.0–8.5)
eGFR: 18 mL/min/1.73 — ABNORMAL LOW

## 2024-10-03 MED ORDER — POTASSIUM CHLORIDE CRYS ER 20 MEQ PO TBCR: 40.0000 meq | EXTENDED_RELEASE_TABLET | Freq: Three times a day (TID) | ORAL | 0 refills | Status: DC

## 2024-10-03 MED ORDER — POTASSIUM CHLORIDE 20 MEQ PO PACK: 40.0000 meq | PACK | Freq: Three times a day (TID) | ORAL | 1 refills | Status: AC

## 2024-10-03 NOTE — Telephone Encounter (Signed)
 Pt has been made aware of her lab results.  Pt cannot take Potassium pills, so I called the pharmacist and she helped me switch it over to the Potassium Chloride  Packet.  Pt has been made aware and will pick up later today and get it started.  Pt aware of the referral to Pharm D for lipid.  Pt aware to get repeat BMET 10/12/24, order has been released.

## 2024-10-03 NOTE — Telephone Encounter (Signed)
 Addressing this and thank you

## 2024-10-03 NOTE — Telephone Encounter (Signed)
-----   Message from Gordy Bergamo, MD sent at 10/03/2024  9:03 AM EST -----  Please also refer her to clinical pharmacist for initiation of Repatha or Lequvio for elevated Lp(a) and LDL target of < 55.

## 2024-10-03 NOTE — Telephone Encounter (Signed)
" ° °    ICD-10-CM   1. CKD (chronic kidney disease) stage 4, GFR 15-29 ml/min (HCC)  N18.4 Basic metabolic panel with GFR    2. Hypokalemia  E87.6 potassium chloride  SA (KLOR-CON  M) 20 MEQ tablet    Basic metabolic panel with GFR     Orders Placed This Encounter  Procedures   Basic metabolic panel with GFR    Standing Status:   Future    Expected Date:   10/17/2024    Expiration Date:   10/03/2025    Meds ordered this encounter  Medications   potassium chloride  SA (KLOR-CON  M) 20 MEQ tablet    Sig: Take 2 tablets (40 mEq total) by mouth in the morning, at noon, and at bedtime. 2 tab 3 times a day for 2 days then 1 tab twice daily    Dispense:  60 tablet    Refill:  0    "

## 2024-10-03 NOTE — Addendum Note (Signed)
 Addended by: MEMORY DELON POUR on: 10/03/2024 10:14 AM   Modules accepted: Orders

## 2024-10-04 ENCOUNTER — Ambulatory Visit: Admitting: General Practice

## 2024-10-09 ENCOUNTER — Ambulatory Visit (HOSPITAL_BASED_OUTPATIENT_CLINIC_OR_DEPARTMENT_OTHER)
Admission: RE | Admit: 2024-10-09 | Discharge: 2024-10-09 | Disposition: A | Discharge status: Home / Self Care | Source: Ambulatory Visit | Attending: Cardiology | Admitting: Cardiology

## 2024-10-09 DIAGNOSIS — I701 Atherosclerosis of renal artery: Secondary | ICD-10-CM | POA: Diagnosis present

## 2024-10-13 NOTE — Progress Notes (Signed)
 Please schedule her for an urgent visit to discuss renal angiogram and can be seen by anyone. She has severe right renal artery stenosis and perhaps this could explain her renal failure.

## 2024-10-18 ENCOUNTER — Other Ambulatory Visit: Payer: Self-pay

## 2024-10-18 ENCOUNTER — Emergency Department (HOSPITAL_BASED_OUTPATIENT_CLINIC_OR_DEPARTMENT_OTHER)
Admission: EM | Admit: 2024-10-18 | Discharge: 2024-10-18 | Disposition: A | Discharge status: Home / Self Care | Attending: Emergency Medicine | Admitting: Emergency Medicine

## 2024-10-18 ENCOUNTER — Emergency Department (HOSPITAL_BASED_OUTPATIENT_CLINIC_OR_DEPARTMENT_OTHER)

## 2024-10-18 ENCOUNTER — Encounter (HOSPITAL_BASED_OUTPATIENT_CLINIC_OR_DEPARTMENT_OTHER): Payer: Self-pay | Admitting: Emergency Medicine

## 2024-10-18 DIAGNOSIS — J449 Chronic obstructive pulmonary disease, unspecified: Secondary | ICD-10-CM | POA: Insufficient documentation

## 2024-10-18 DIAGNOSIS — Z7982 Long term (current) use of aspirin: Secondary | ICD-10-CM | POA: Insufficient documentation

## 2024-10-18 DIAGNOSIS — R0981 Nasal congestion: Secondary | ICD-10-CM | POA: Insufficient documentation

## 2024-10-18 DIAGNOSIS — J111 Influenza due to unidentified influenza virus with other respiratory manifestations: Secondary | ICD-10-CM

## 2024-10-18 DIAGNOSIS — Z7951 Long term (current) use of inhaled steroids: Secondary | ICD-10-CM | POA: Insufficient documentation

## 2024-10-18 DIAGNOSIS — R059 Cough, unspecified: Secondary | ICD-10-CM | POA: Insufficient documentation

## 2024-10-18 LAB — COMPREHENSIVE METABOLIC PANEL WITH GFR
ALT: 27 U/L (ref 0–44)
AST: 36 U/L (ref 15–41)
Albumin: 4.2 g/dL (ref 3.5–5.0)
Alkaline Phosphatase: 168 U/L — ABNORMAL HIGH (ref 38–126)
Anion gap: 12 (ref 5–15)
BUN: 10 mg/dL (ref 8–23)
CO2: 27 mmol/L (ref 22–32)
Calcium: 9.5 mg/dL (ref 8.9–10.3)
Chloride: 104 mmol/L (ref 98–111)
Creatinine, Ser: 1.32 mg/dL — ABNORMAL HIGH (ref 0.44–1.00)
GFR, Estimated: 43 mL/min — ABNORMAL LOW
Glucose, Bld: 70 mg/dL (ref 70–99)
Potassium: 2.8 mmol/L — ABNORMAL LOW (ref 3.5–5.1)
Sodium: 143 mmol/L (ref 135–145)
Total Bilirubin: 0.4 mg/dL (ref 0.0–1.2)
Total Protein: 6.8 g/dL (ref 6.5–8.1)

## 2024-10-18 LAB — CBC
HCT: 35 % — ABNORMAL LOW (ref 36.0–46.0)
Hemoglobin: 11.8 g/dL — ABNORMAL LOW (ref 12.0–15.0)
MCH: 34.4 pg — ABNORMAL HIGH (ref 26.0–34.0)
MCHC: 33.7 g/dL (ref 30.0–36.0)
MCV: 102 fL — ABNORMAL HIGH (ref 80.0–100.0)
Platelets: 210 10*3/uL (ref 150–400)
RBC: 3.43 MIL/uL — ABNORMAL LOW (ref 3.87–5.11)
RDW: 18 % — ABNORMAL HIGH (ref 11.5–15.5)
WBC: 4.8 10*3/uL (ref 4.0–10.5)
nRBC: 0 % (ref 0.0–0.2)

## 2024-10-18 LAB — RESP PANEL BY RT-PCR (RSV, FLU A&B, COVID)  RVPGX2
Influenza A by PCR: NEGATIVE
Influenza B by PCR: NEGATIVE
Resp Syncytial Virus by PCR: NEGATIVE
SARS Coronavirus 2 by RT PCR: NEGATIVE

## 2024-10-18 MED ORDER — AZITHROMYCIN 250 MG PO TABS: ORAL_TABLET | ORAL | 0 refills | Status: AC

## 2024-10-18 NOTE — Progress Notes (Signed)
 She can wait, I would like to see her in person and discuss renal angiogram

## 2024-10-18 NOTE — ED Triage Notes (Signed)
 Pt c/o body aches, cough x 2 days.

## 2024-10-18 NOTE — ED Provider Notes (Signed)
 " [place] [facility] Provider Note   CSN: [id number] Arrival date & time: 10/18/24  2029     Patient presents with: URI   Morgan Sparks is a 74 y.o. female.   Patient is a 74 year old female with history of COPD, GERD.  Patient presenting today with complaints of URI symptoms.  She describes a several day history of congestion and cough.  She denies any chest pain or difficulty breathing.  No fevers or chills.  She has not tried any over-the-counter medications for her symptoms.  Her husband recently was ill with similar symptoms.       Prior to Admission medications  Medication Sig Start Date End Date Taking? Authorizing Provider  albuterol  (VENTOLIN  HFA) 108 (90 Base) MCG/ACT inhaler Inhale 2 puffs into the lungs daily as needed for wheezing or shortness of breath.  12/18/19   [provider]  aspirin  EC 81 MG tablet Take 1 tablet (81 mg total) by mouth daily. Swallow whole. 08/16/24   Dunn, Dayna N, PA-C  cyclobenzaprine (FLEXERIL) 10 MG tablet Take 10 mg by mouth 2 (two) times daily as needed for muscle spasms. 06/09/24   [provider]  famotidine (PEPCID) 20 MG tablet Take 20 mg by mouth daily as needed for heartburn or indigestion.    [provider]  fluticasone  (FLONASE ) 50 MCG/ACT nasal spray Place 1-2 sprays into both nostrils daily as needed for allergies. 03/07/22   [provider]  ipratropium-albuterol  (DUONEB) 0.5-2.5 (3) MG/3ML SOLN Inhale 3 mLs into the lungs every 6 (six) hours as needed (wheezing / shortness of breath). 07/25/24   [provider]  isosorbide  mononitrate (IMDUR ) 30 MG 24 hr tablet Take 1 tablet (30 mg total) by mouth daily. 08/16/24   Dunn, Dayna N, PA-C  metoprolol  tartrate (LOPRESSOR ) 25 MG tablet Take 1 tablet (25 mg total) by mouth 2 (two) times daily. 08/15/24   Dunn, Dayna N, PA-C  nitroGLYCERIN  (NITROSTAT ) 0.4 MG SL tablet Place 1 tablet (0.4 mg total) under the  tongue every 5 (five) minutes as needed for chest pain (up to 3 doses). 08/15/24 08/15/25  Dunn, Dayna N, PA-C  pantoprazole  (PROTONIX ) 40 MG tablet Take 1 tablet (40 mg total) by mouth daily. 08/16/24   Dunn, Dayna N, PA-C  potassium chloride  (KLOR-CON ) 20 MEQ packet Take 40 mEq by mouth 3 (three) times daily. 10/03/24   Ladona Heinz, MD  rosuvastatin  (CRESTOR ) 40 MG tablet Take 1 tablet (40 mg total) by mouth daily. 08/15/24   Dunn, Dayna N, PA-C  ticagrelor  (BRILINTA ) 90 MG TABS tablet Take 1 tablet (90 mg total) by mouth 2 (two) times daily. 09/27/24   Ladona Heinz, MD  torsemide (DEMADEX) 20 MG tablet Take 20 mg by mouth daily as needed (edema). 06/22/24   [provider]  TRELEGY ELLIPTA  100-62.5-25 MCG/ACT AEPB Inhale 1 puff into the lungs daily at 12 noon.    [provider]  Vitamin D , Ergocalciferol , (DRISDOL ) 1.25 MG (50000 UNIT) CAPS capsule Take 50,000 Units by mouth every 7 (seven) days. 03/07/24   [provider]    Allergies: Dilaudid  [hydromorphone ], Doxycycline, and Strawberry (diagnostic)    Review of Systems  All other systems reviewed and are negative.   Updated Vital Signs BP 121/81 (BP Location: Right Arm)   Pulse 80   Temp 97.9 F (36.6 C)   Resp 20   Ht 5' 5.5 (1.664 m)   Wt 62.1 kg   SpO2 96%  BMI 22.45 kg/m   Physical Exam Vitals and nursing note reviewed.  Constitutional:      General: She is not in acute distress.    Appearance: She is well-developed. She is not diaphoretic.  HENT:     Head: Normocephalic and atraumatic.     Mouth/Throat:     Mouth: Mucous membranes are moist.     Pharynx: No oropharyngeal exudate.  Cardiovascular:     Rate and Rhythm: Normal rate and regular rhythm.     Heart sounds: No murmur heard.    No friction rub. No gallop.  Pulmonary:     Effort: Pulmonary effort is normal. No respiratory distress.     Breath sounds: Normal breath sounds. No wheezing.  Abdominal:     General: Bowel sounds are  normal. There is no distension.     Palpations: Abdomen is soft.     Tenderness: There is no abdominal tenderness.  Musculoskeletal:        General: Normal range of motion.     Cervical back: Normal range of motion and neck supple.  Skin:    General: Skin is warm and dry.  Neurological:     General: No focal deficit present.     Mental Status: She is alert and oriented to person, place, and time.     (all labs ordered are listed, but only abnormal results are displayed) Labs Reviewed  CBC - Abnormal; Notable for the following components:      Result Value   RBC 3.43 (*)    Hemoglobin 11.8 (*)    HCT 35.0 (*)    MCV 102.0 (*)    MCH 34.4 (*)    RDW 18.0 (*)    All other components within normal limits  COMPREHENSIVE METABOLIC PANEL WITH GFR - Abnormal; Notable for the following components:   Potassium 2.8 (*)    Creatinine, Ser 1.32 (*)    Alkaline Phosphatase 168 (*)    GFR, Estimated 43 (*)    All other components within normal limits  RESP PANEL BY RT-PCR (RSV, FLU A&B, COVID)  [id number]    EKG: None  Radiology: DG Chest 2 View Result Date: 10/18/2024 CLINICAL DATA:  Cough. EXAM: CHEST - 2 VIEW COMPARISON:  Chest radiograph dated 08/14/2024. FINDINGS: No focal consolidation, pleural effusion or pneumothorax. The cardiac silhouette is within normal limits. Loop recorder device. No acute osseous pathology. IMPRESSION: No active cardiopulmonary disease. Electronically Signed   By: Vanetta Chou M.D.   On: 10/18/2024 21:00     Procedures   Medications Ordered in the ED - No data to display                                  Medical Decision Making Amount and/or Complexity of Data Reviewed Labs: ordered. Radiology: ordered.   Patient presenting with complaints of URI symptoms as described in the HPI.  She arrives here with stable vital signs and is afebrile.  Physical examination is unremarkable.  Laboratory studies obtained in triage including CBC and basic  metabolic panel, both of which are unremarkable.  There is no leukocytosis or significant electrolyte abnormality.  COVID/flu/RSV is all negative.  Chest x-ray showing no acute process.  I suspect a viral etiology to her symptoms and will recommend over-the-counter medications.  Given her age and comorbidities, I will prescribe an antibiotic which she can get filled if symptoms are not improving in the next  2 days.     Final diagnoses:  None    ED Discharge Orders     None          Geroldine Berg, MD 10/18/24 2317  "

## 2024-10-18 NOTE — Discharge Instructions (Addendum)
 Take over-the-counter medications as needed for relief of symptoms.  If symptoms are not improving in the next 2 to 3 days, fill the prescription for Zithromax  you have been given this evening.  Return to the ER for chest pain, difficulty breathing, or for other new and concerning symptoms.

## 2024-10-22 NOTE — Telephone Encounter (Signed)
 Pt returning call

## 2024-11-01 ENCOUNTER — Ambulatory Visit

## 2024-11-12 ENCOUNTER — Ambulatory Visit: Admitting: Cardiology

## 2024-11-27 ENCOUNTER — Ambulatory Visit: Admitting: Pharmacist Clinician (PhC)/ Clinical Pharmacy Specialist

## 2024-12-03 ENCOUNTER — Ambulatory Visit

## 2025-01-03 ENCOUNTER — Ambulatory Visit

## 2025-02-04 ENCOUNTER — Ambulatory Visit
